# Patient Record
Sex: Female | Born: 1945 | Race: Black or African American | Hispanic: No | Smoking: Never smoker
Health system: Southern US, Community
[De-identification: ages and names within clinical notes are randomized; demographics above are authoritative.]

## PROBLEM LIST (undated history)

## (undated) DIAGNOSIS — M1990 Unspecified osteoarthritis, unspecified site: Secondary | ICD-10-CM

## (undated) DIAGNOSIS — I4891 Unspecified atrial fibrillation: Secondary | ICD-10-CM

## (undated) DIAGNOSIS — Z9289 Personal history of other medical treatment: Secondary | ICD-10-CM

## (undated) DIAGNOSIS — R079 Chest pain, unspecified: Secondary | ICD-10-CM

## (undated) DIAGNOSIS — I5189 Other ill-defined heart diseases: Secondary | ICD-10-CM

## (undated) DIAGNOSIS — M8580 Other specified disorders of bone density and structure, unspecified site: Secondary | ICD-10-CM

## (undated) DIAGNOSIS — R55 Syncope and collapse: Secondary | ICD-10-CM

## (undated) DIAGNOSIS — E785 Hyperlipidemia, unspecified: Secondary | ICD-10-CM

## (undated) DIAGNOSIS — I10 Essential (primary) hypertension: Secondary | ICD-10-CM

## (undated) DIAGNOSIS — N183 Chronic kidney disease, stage 3 unspecified: Secondary | ICD-10-CM

## (undated) HISTORY — DX: Other ill-defined heart diseases: I51.89

## (undated) HISTORY — PX: OTHER SURGICAL HISTORY: SHX169

## (undated) HISTORY — DX: Unspecified osteoarthritis, unspecified site: M19.90

## (undated) HISTORY — PX: HERNIA REPAIR: SHX51

## (undated) HISTORY — DX: Syncope and collapse: R55

## (undated) HISTORY — PX: ABDOMINAL HYSTERECTOMY: SHX81

## (undated) HISTORY — DX: Unspecified atrial fibrillation: I48.91

## (undated) HISTORY — DX: Hyperlipidemia, unspecified: E78.5

## (undated) HISTORY — DX: Chest pain, unspecified: R07.9

## (undated) HISTORY — DX: Chronic kidney disease, stage 3 unspecified: N18.30

## (undated) HISTORY — DX: Personal history of other medical treatment: Z92.89

## (undated) HISTORY — DX: Other specified disorders of bone density and structure, unspecified site: M85.80

---

## 2000-01-27 ENCOUNTER — Encounter: Admission: RE | Admit: 2000-01-27 | Discharge: 2000-04-26 | Payer: Self-pay | Admitting: Family Medicine

## 2003-09-22 ENCOUNTER — Encounter
Admission: RE | Admit: 2003-09-22 | Discharge: 2003-12-21 | Payer: Self-pay | Admitting: Physical Medicine & Rehabilitation

## 2005-01-17 ENCOUNTER — Encounter: Admission: RE | Admit: 2005-01-17 | Discharge: 2005-01-17 | Payer: Self-pay | Admitting: Family Medicine

## 2005-12-17 ENCOUNTER — Emergency Department (HOSPITAL_COMMUNITY): Admission: EM | Admit: 2005-12-17 | Discharge: 2005-12-17 | Payer: Self-pay | Admitting: Emergency Medicine

## 2006-06-14 ENCOUNTER — Ambulatory Visit (HOSPITAL_COMMUNITY): Admission: RE | Admit: 2006-06-14 | Discharge: 2006-06-14 | Payer: Self-pay | Admitting: Gastroenterology

## 2008-02-06 ENCOUNTER — Encounter: Admission: RE | Admit: 2008-02-06 | Discharge: 2008-02-06 | Payer: Self-pay | Admitting: Family Medicine

## 2010-06-20 ENCOUNTER — Encounter: Admission: RE | Admit: 2010-06-20 | Discharge: 2010-06-20 | Payer: Self-pay | Admitting: Family Medicine

## 2012-03-16 ENCOUNTER — Ambulatory Visit (INDEPENDENT_AMBULATORY_CARE_PROVIDER_SITE_OTHER): Payer: PRIVATE HEALTH INSURANCE | Admitting: Family Medicine

## 2012-03-16 VITALS — BP 162/76 | HR 86 | Temp 98.1°F | Resp 16 | Ht 65.25 in | Wt 166.0 lb

## 2012-03-16 DIAGNOSIS — L02419 Cutaneous abscess of limb, unspecified: Secondary | ICD-10-CM

## 2012-03-16 DIAGNOSIS — L03119 Cellulitis of unspecified part of limb: Secondary | ICD-10-CM

## 2012-03-16 LAB — POCT CBC
Granulocyte percent: 62 % (ref 37–80)
HCT, POC: 39.4 % (ref 37.7–47.9)
Hemoglobin: 11.9 g/dL — AB (ref 12.2–16.2)
Lymph, poc: 2.2 (ref 0.6–3.4)
MCH, POC: 26.6 pg — AB (ref 27–31.2)
MCHC: 30.2 g/dL — AB (ref 31.8–35.4)
MCV: 88.2 fL (ref 80–97)
MID (cbc): 0.6 (ref 0–0.9)
MPV: 8.4 fL (ref 0–99.8)
POC Granulocyte: 4.6 (ref 2–6.9)
POC LYMPH PERCENT: 29.7 % (ref 10–50)
POC MID %: 8.3 % (ref 0–12)
Platelet Count, POC: 234 10*3/uL (ref 142–424)
RBC: 4.47 M/uL (ref 4.04–5.48)
RDW, POC: 15 %
WBC: 7.5 10*3/uL (ref 4.6–10.2)

## 2012-03-16 MED ORDER — CEPHALEXIN 500 MG PO CAPS: 1000.0000 mg | ORAL_CAPSULE | Freq: Two times a day (BID) | ORAL | Status: AC

## 2012-03-16 NOTE — Progress Notes (Signed)
Patient Name: Christine Hogan Date of Birth: 1946-02-19 Medical Record Number: [id number] Gender: female Date of Encounter: 03/16/2012  History of Present Illness:  Christine Hogan is a 66 y.o. very pleasant female patient who presents with the following:  This morning she woke up with pain in her right shin.  It hurts to walk on the leg.  Never had this before.  No known injury.  Otherwise she feels ok. She did have a fever yesterday up to 102 and had to leave work early, however  this seems to have resolved today. She has not taken any anti-pyretics today  She does have a habit of picking her skin and has picked 2 places on her right calf recently.  She feels that this may be why she has her current infection.  Otherwise she is generally healthy   There is no problem list on file for this patient.  No past medical history on file. No past surgical history on file. History  Substance Use Topics  . Smoking status: Never Smoker   . Smokeless tobacco: Not on file  . Alcohol Use: Not on file   No family history on file. Allergies  Allergen Reactions  . Codeine Nausea And Vomiting    Medication list has been reviewed and updated.  Review of Systems: As per HPI- otherwise negative.  Physical Examination: Filed Vitals:   03/16/12 1537  BP: 162/76  Pulse: 86  Temp: 98.1 F (36.7 C)  Resp: 16  Height: 5' 5.25" (1.657 m)  Weight: 166 lb (75.297 kg)    Body mass index is 27.41 kg/(m^2).  GEN: WDWN, NAD, Non-toxic, A & O x 3 HEENT: Atraumatic, Normocephalic. Neck supple. No masses, No LAD. Ears and Nose: No external deformity. CV: RRR, No M/G/R. No JVD. No thrill. No extra heart sounds. PULM: CTA B, no wheezes, crackles, rhonchi. No retractions. No resp. distress. No accessory muscle use. EXTR: No c/c/e NEURO Normal gait.  PSYCH: Normally interactive. Conversant. Not depressed or anxious appearing.  Calm demeanor.  Right calf: on the medial distal calf there are two areas of  redness, induration and heat.  No fluctuance to suggest abscess.  No other rashes. However, her right calf has two "picked" areas where the skin has been picked away- one is fairly near the infected area.    Results for orders placed in visit on 03/16/12  POCT CBC      Component Value Range   WBC 7.5  4.6 - 10.2 (K/uL)   Lymph, poc 2.2  0.6 - 3.4    POC LYMPH PERCENT 29.7  10 - 50 (%L)   MID (cbc) 0.6  0 - 0.9    POC MID % 8.3  0 - 12 (%M)   POC Granulocyte 4.6  2 - 6.9    Granulocyte percent 62.0  37 - 80 (%G)   RBC 4.47  4.04 - 5.48 (M/uL)   Hemoglobin 11.9 (*) 12.2 - 16.2 (g/dL)   HCT, POC 40.9  81.1 - 47.9 (%)   MCV 88.2  80 - 97 (fL)   MCH, POC 26.6 (*) 27 - 31.2 (pg)   MCHC 30.2 (*) 31.8 - 35.4 (g/dL)   RDW, POC 91.4     Platelet Count, POC 234  142 - 424 (K/uL)   MPV 8.4  0 - 99.8 (fL)    Assessment and Plan: 1. Cellulitis, leg  cephALEXin (KEFLEX) 500 MG capsule, POCT CBC   Suspect that Lanore has developed cellulitis due  to picking at her skin.  She is aware now that she needs to stop doing this and feels motivated to do so.  Will use keflex as above, elevate the leg when able and use NSAIDs as needed.  Out of work tomorrow to elevate leg.  Patient (or parent if minor) instructed to return to clinic or call if not better in 2 day(s).  Sooner if worse.

## 2012-03-18 ENCOUNTER — Telehealth: Payer: Self-pay

## 2012-03-18 NOTE — Telephone Encounter (Signed)
Ok for note 

## 2012-03-18 NOTE — Telephone Encounter (Signed)
Pt states she was suppose to rtn to work today,but still in too much pain,needs oow note for today onl;y,will be going back 0514   Please call [phone]

## 2012-03-19 NOTE — Telephone Encounter (Signed)
LMOM THAT NOTE IS READY FOR PICKUP

## 2012-07-02 ENCOUNTER — Ambulatory Visit (INDEPENDENT_AMBULATORY_CARE_PROVIDER_SITE_OTHER): Payer: PRIVATE HEALTH INSURANCE | Admitting: Internal Medicine

## 2012-07-02 ENCOUNTER — Ambulatory Visit: Payer: PRIVATE HEALTH INSURANCE

## 2012-07-02 VITALS — BP 160/80 | HR 65 | Temp 97.8°F | Resp 16 | Ht 66.5 in | Wt 172.0 lb

## 2012-07-02 DIAGNOSIS — M549 Dorsalgia, unspecified: Secondary | ICD-10-CM

## 2012-07-02 DIAGNOSIS — M5380 Other specified dorsopathies, site unspecified: Secondary | ICD-10-CM

## 2012-07-02 DIAGNOSIS — M545 Low back pain, unspecified: Secondary | ICD-10-CM

## 2012-07-02 DIAGNOSIS — N23 Unspecified renal colic: Secondary | ICD-10-CM

## 2012-07-02 DIAGNOSIS — M62830 Muscle spasm of back: Secondary | ICD-10-CM

## 2012-07-02 DIAGNOSIS — R319 Hematuria, unspecified: Secondary | ICD-10-CM

## 2012-07-02 LAB — POCT URINALYSIS DIPSTICK
Bilirubin, UA: NEGATIVE
Glucose, UA: NEGATIVE
Ketones, UA: NEGATIVE
Leukocytes, UA: NEGATIVE
Nitrite, UA: NEGATIVE
Protein, UA: NEGATIVE
Spec Grav, UA: 1.02
Urobilinogen, UA: 0.2
pH, UA: 5

## 2012-07-02 LAB — POCT UA - MICROSCOPIC ONLY
Casts, Ur, LPF, POC: NEGATIVE
Crystals, Ur, HPF, POC: NEGATIVE
Yeast, UA: NEGATIVE

## 2012-07-02 MED ORDER — TRAMADOL HCL 50 MG PO TABS: 50.0000 mg | ORAL_TABLET | Freq: Three times a day (TID) | ORAL | Status: AC | PRN

## 2012-07-02 MED ORDER — CYCLOBENZAPRINE HCL 5 MG PO TABS: 5.0000 mg | ORAL_TABLET | Freq: Three times a day (TID) | ORAL | Status: AC | PRN

## 2012-07-02 NOTE — Patient Instructions (Signed)
Ultram and flexeril as directed. increase fluids. If your pain increases or if you develop vomiting or fever return to the office.

## 2012-07-02 NOTE — Progress Notes (Signed)
  Subjective:    Patient ID: Christine Hogan, female    DOB: 1945/12/08, 66 y.o.   MRN: [id number]  HPI  Back pain Moderate in severity 7/10 Increased with motion Onset yesterday after she got home from work Did lift and move a heavy patient at work yesterday and the friends home no numbness or tingling of extremities. No dysuria Review of Systems  Musculoskeletal: Positive for back pain.  All other systems reviewed and are negative.       Objective:   Physical Exam  Nursing note and vitals reviewed. Constitutional: She is oriented to person, place, and time. She appears well-developed and well-nourished.  HENT:  Head: Normocephalic and atraumatic.  Right Ear: External ear normal.  Left Ear: External ear normal.  Nose: Nose normal.  Mouth/Throat: Oropharynx is clear and moist.  Eyes: Conjunctivae and EOM are normal. Pupils are equal, round, and reactive to light.  Neck: Normal range of motion. Neck supple.  Cardiovascular: Normal rate, regular rhythm and normal heart sounds.   Pulmonary/Chest: Effort normal and breath sounds normal.  Abdominal: Soft. Bowel sounds are normal.  Musculoskeletal: She exhibits tenderness.       Spasm of the muscles on the left side  Neurological: She is alert and oriented to person, place, and time. She has normal reflexes.  Skin: Skin is warm and dry.  Psychiatric: She has a normal mood and affect. Her behavior is normal. Judgment and thought content normal.     [facility] reading (PRIMARY) by  Dr. Mindi Junker lumbar spine negitive Results for orders placed in visit on 07/02/12  POCT URINALYSIS DIPSTICK      Component Value Range   Color, UA yellow     Clarity, UA clear     Glucose, UA neg     Bilirubin, UA neg     Ketones, UA neg     Spec Grav, UA 1.020     Blood, UA mod     pH, UA 5.0     Protein, UA neg     Urobilinogen, UA 0.2     Nitrite, UA neg     Leukocytes, UA Negative    POCT UA - MICROSCOPIC ONLY      Component Value Range   WBC,  Ur, HPF, POC 0-1     RBC, urine, microscopic 0-6     Bacteria, U Microscopic trace     Mucus, UA trace     Epithelial cells, urine per micros 0-1     Crystals, Ur, HPF, POC neg     Casts, Ur, LPF, POC neg     Yeast, UA neg    .  Ua with moderate blood  0-6 rbc   Assessment & Plan:  Back pain left flank are Will access ua and xray s xrays noirmal ua with blood may reflect renal colic  Also has musculoskeletal pain.as reflected by hte spasm detected o n the exam

## 2012-07-27 ENCOUNTER — Encounter (HOSPITAL_COMMUNITY): Payer: Self-pay

## 2012-07-27 ENCOUNTER — Emergency Department (HOSPITAL_COMMUNITY)
Admission: EM | Admit: 2012-07-27 | Discharge: 2012-07-27 | Disposition: A | Discharge status: Home / Self Care | Payer: PRIVATE HEALTH INSURANCE | Attending: Emergency Medicine | Admitting: Emergency Medicine

## 2012-07-27 DIAGNOSIS — N289 Disorder of kidney and ureter, unspecified: Secondary | ICD-10-CM | POA: Insufficient documentation

## 2012-07-27 DIAGNOSIS — Z79899 Other long term (current) drug therapy: Secondary | ICD-10-CM | POA: Insufficient documentation

## 2012-07-27 DIAGNOSIS — I10 Essential (primary) hypertension: Secondary | ICD-10-CM | POA: Insufficient documentation

## 2012-07-27 DIAGNOSIS — R55 Syncope and collapse: Secondary | ICD-10-CM | POA: Insufficient documentation

## 2012-07-27 HISTORY — DX: Essential (primary) hypertension: I10

## 2012-07-27 LAB — URINALYSIS, ROUTINE W REFLEX MICROSCOPIC
Glucose, UA: NEGATIVE mg/dL
Ketones, ur: 15 mg/dL — AB
Leukocytes, UA: NEGATIVE
Nitrite: NEGATIVE
Protein, ur: 100 mg/dL — AB
Specific Gravity, Urine: 1.034 — ABNORMAL HIGH (ref 1.005–1.030)
Urobilinogen, UA: 1 mg/dL (ref 0.0–1.0)
pH: 5 (ref 5.0–8.0)

## 2012-07-27 LAB — CBC WITH DIFFERENTIAL/PLATELET
Basophils Absolute: 0 10*3/uL (ref 0.0–0.1)
Basophils Relative: 0 % (ref 0–1)
Eosinophils Absolute: 0 10*3/uL (ref 0.0–0.7)
Eosinophils Relative: 0 % (ref 0–5)
HCT: 39.8 % (ref 36.0–46.0)
Hemoglobin: 13 g/dL (ref 12.0–15.0)
Lymphocytes Relative: 15 % (ref 12–46)
Lymphs Abs: 1.2 10*3/uL (ref 0.7–4.0)
MCH: 28.1 pg (ref 26.0–34.0)
MCHC: 32.7 g/dL (ref 30.0–36.0)
MCV: 86.1 fL (ref 78.0–100.0)
Monocytes Absolute: 0.7 10*3/uL (ref 0.1–1.0)
Monocytes Relative: 9 % (ref 3–12)
Neutro Abs: 5.9 10*3/uL (ref 1.7–7.7)
Neutrophils Relative %: 75 % (ref 43–77)
Platelets: 209 10*3/uL (ref 150–400)
RBC: 4.62 MIL/uL (ref 3.87–5.11)
RDW: 13.9 % (ref 11.5–15.5)
WBC: 7.8 10*3/uL (ref 4.0–10.5)

## 2012-07-27 LAB — URINE MICROSCOPIC-ADD ON

## 2012-07-27 LAB — BASIC METABOLIC PANEL
BUN: 22 mg/dL (ref 6–23)
CO2: 27 mEq/L (ref 19–32)
Calcium: 10.9 mg/dL — ABNORMAL HIGH (ref 8.4–10.5)
Chloride: 101 mEq/L (ref 96–112)
Creatinine, Ser: 1.78 mg/dL — ABNORMAL HIGH (ref 0.50–1.10)
GFR calc Af Amer: 34 mL/min — ABNORMAL LOW (ref >90)
GFR calc non Af Amer: 29 mL/min — ABNORMAL LOW (ref >90)
Glucose, Bld: 117 mg/dL — ABNORMAL HIGH (ref 70–99)
Potassium: 3.8 mEq/L (ref 3.5–5.1)
Sodium: 140 mEq/L (ref 135–145)

## 2012-07-27 MED ORDER — ACETAMINOPHEN 500 MG PO TABS
1000.0000 mg | ORAL_TABLET | Freq: Once | ORAL | Status: AC
  Administered 2012-07-27: 1000 mg via ORAL
  Filled 2012-07-27: qty 2

## 2012-07-27 NOTE — ED Notes (Signed)
Per EMS pt at a church function sitting eating and became dizzy and sweaty, stood up and became syncopal, no fall, was assisted, out for 5 mins,

## 2012-07-27 NOTE — ED Notes (Signed)
Patient currently resting quietly in bed; no respiratory or acute distress noted.  Patient updated on plan of care; informed patient that we are currently waiting on EDP to talk about test results. Patient denies any needs at this time; will continue to monitor.

## 2012-07-27 NOTE — ED Notes (Signed)
AIDET performed. 

## 2012-07-27 NOTE — ED Notes (Signed)
Pt denies dizziness/lightheadedness

## 2012-07-27 NOTE — ED Provider Notes (Signed)
History     CSN: [id number]  Arrival date & time 07/27/12  1478   First MD Initiated Contact with Patient 07/27/12 1844      Chief Complaint  Patient presents with  . Loss of Consciousness    (Consider location/radiation/quality/duration/timing/severity/associated sxs/prior treatment) HPI... patient was sitting in church when she felt hot and sweaty. She sat up and passed out. No prodromal illnesses. No fevers, sweats, chills, stiff neck, neuro deficits, chest pain, shortness of breath, dysuria.  She is feeling better now. Nothing makes symptoms better or worse. Severity is mild to moderate.  Past Medical History  Diagnosis Date  . Hypertension     History reviewed. No pertinent past surgical history.  History reviewed. No pertinent family history.  History  Substance Use Topics  . Smoking status: Never Smoker   . Smokeless tobacco: Not on file  . Alcohol Use: Not on file    OB History    Grav Para Term Preterm Abortions TAB SAB Ect Mult Living                  Review of Systems  All other systems reviewed and are negative.    Allergies  Codeine  Home Medications   Current Outpatient Rx  Name Route Sig Dispense Refill  . ALISKIREN FUMARATE 300 MG PO TABS Oral Take 300 mg by mouth daily.    Marland Kitchen AMLODIPINE BESYLATE 10 MG PO TABS Oral Take 10 mg by mouth daily.    Marland Kitchen CLONIDINE HCL 0.1 MG PO TABS Oral Take 0.1 mg by mouth at bedtime.     . OMEGA-3 FATTY ACIDS 1000 MG PO CAPS Oral Take 1 g by mouth daily.     Marland Kitchen HYDROCHLOROTHIAZIDE 25 MG PO TABS Oral Take 25 mg by mouth daily.      BP 147/58  Pulse 70  Temp 98.2 F (36.8 C) (Oral)  Resp 25  SpO2 99%  Physical Exam  Nursing note and vitals reviewed. Constitutional: She is oriented to person, place, and time. She appears well-developed and well-nourished.  HENT:  Head: Normocephalic and atraumatic.  Eyes: Conjunctivae normal and EOM are normal. Pupils are equal, round, and reactive to light.  Neck: Normal  range of motion. Neck supple.  Cardiovascular: Normal rate, regular rhythm and normal heart sounds.   Pulmonary/Chest: Effort normal and breath sounds normal.  Abdominal: Soft. Bowel sounds are normal.  Musculoskeletal: Normal range of motion.  Neurological: She is alert and oriented to person, place, and time.  Skin: Skin is warm and dry.  Psychiatric: She has a normal mood and affect.    ED Course  Procedures (including critical care time)  Labs Reviewed  BASIC METABOLIC PANEL - Abnormal; Notable for the following:    Glucose, Bld 117 (*)     Creatinine, Ser 1.78 (*)     Calcium 10.9 (*)     GFR calc non Af Amer 29 (*)     GFR calc Af Amer 34 (*)     All other components within normal limits  URINALYSIS, ROUTINE W REFLEX MICROSCOPIC - Abnormal; Notable for the following:    Color, Urine AMBER (*)  BIOCHEMICALS MAY BE AFFECTED BY COLOR   APPearance CLOUDY (*)     Specific Gravity, Urine 1.034 (*)     Hgb urine dipstick MODERATE (*)     Bilirubin Urine SMALL (*)     Ketones, ur 15 (*)     Protein, ur 100 (*)     All  other components within normal limits  URINE MICROSCOPIC-ADD ON - Abnormal; Notable for the following:    Squamous Epithelial / LPF MANY (*)     Bacteria, UA FEW (*)     Casts GRANULAR CAST (*)  HYALINE CASTS   Crystals CA OXALATE CRYSTALS (*)     All other components within normal limits  CBC WITH DIFFERENTIAL   No results found.   No diagnosis found.   Date: 07/27/2012  Rate: 76  Rhythm: normal sinus rhythm  QRS Axis: normal  Intervals: normal  ST/T Wave abnormalities: normal  Conduction Disutrbances: none  Narrative Interpretation: unremarkable     MDM  Patient's normal physical exam. EKG normal. No anemia. Slightly elevated creatinine noted and discussed with patient. She was observed for several hours. Feeling better. Discharge home        Donnetta Hutching, MD 07/27/12 [phone]

## 2013-03-18 ENCOUNTER — Telehealth: Payer: Self-pay | Admitting: Family Medicine

## 2013-03-18 NOTE — Telephone Encounter (Signed)
Yes  Call out xanax 0.5 mg poq 8 hrs prn anxiety 20

## 2013-03-18 NOTE — Telephone Encounter (Signed)
Pt has death in family and wants to know if you can prescribe her something for her nerves to get her through.

## 2013-03-21 MED ORDER — ALPRAZOLAM 0.25 MG PO TABS: 0.5000 mg | ORAL_TABLET | Freq: Two times a day (BID) | ORAL | Status: DC | PRN

## 2013-03-21 NOTE — Telephone Encounter (Signed)
Pt called and says she is ready fro Rx called [facility]

## 2013-04-01 ENCOUNTER — Ambulatory Visit: Payer: PRIVATE HEALTH INSURANCE | Admitting: Family Medicine

## 2013-04-03 ENCOUNTER — Encounter: Payer: Self-pay | Admitting: Family Medicine

## 2013-04-03 ENCOUNTER — Ambulatory Visit (INDEPENDENT_AMBULATORY_CARE_PROVIDER_SITE_OTHER): Payer: PRIVATE HEALTH INSURANCE | Admitting: Family Medicine

## 2013-04-03 VITALS — BP 160/80 | HR 78 | Temp 98.2°F | Resp 16 | Wt 172.0 lb

## 2013-04-03 DIAGNOSIS — E782 Mixed hyperlipidemia: Secondary | ICD-10-CM | POA: Insufficient documentation

## 2013-04-03 DIAGNOSIS — I10 Essential (primary) hypertension: Secondary | ICD-10-CM

## 2013-04-03 DIAGNOSIS — E785 Hyperlipidemia, unspecified: Secondary | ICD-10-CM

## 2013-04-03 NOTE — Progress Notes (Signed)
  Subjective:    Patient ID: Christine Hogan, female    DOB: 06/18/1946, 67 y.o.   MRN: [id number]  HPI Patient is here today for followup of her hypertension. She is probably taking Norvasc 10 mg by mouth daily, clonidine 0.1 mg by mouth twice a day, and hydrochlorothiazide 25 mg by mouth daily. Her blood pressures at home ranging 160-180/80-90. She denies any chest pain shortness of breath or dyspnea on exertion.  Chest has hyperlipidemia patient currently on Lipitor 40 mg by mouth daily and Zetia 10 mg by mouth daily. She denies any myalgias right upper quadrant pain. She is due to check her fasting lipid panel. Past Medical History  Diagnosis Date  . Hypertension   . Hyperlipidemia    Current Outpatient Prescriptions on File Prior to Visit  Medication Sig Dispense Refill  . ALPRAZolam (XANAX) 0.25 MG tablet Take 2 tablets (0.5 mg total) by mouth 2 (two) times daily as needed for sleep or anxiety.  20 tablet  0  . amLODipine (NORVASC) 10 MG tablet Take 10 mg by mouth daily.      . cloNIDine (CATAPRES) 0.1 MG tablet Take 0.1 mg by mouth 2 (two) times daily.       . fish oil-omega-3 fatty acids 1000 MG capsule Take 1 g by mouth daily.       . hydrochlorothiazide (HYDRODIURIL) 25 MG tablet Take 25 mg by mouth daily.       No current facility-administered medications on file prior to visit.   Allergies  Allergen Reactions  . Codeine Nausea And Vomiting    dizziness   History   Social History  . Marital Status: Divorced    Spouse Name: N/A    Number of Children: N/A  . Years of Education: N/A   Occupational History  . Not on file.   Social History Main Topics  . Smoking status: Never Smoker   . Smokeless tobacco: Not on file  . Alcohol Use: Not on file  . Drug Use: Not on file  . Sexually Active: Not on file   Other Topics Concern  . Not on file   Social History Narrative  . No narrative on file      Review of Systems  All other systems reviewed and are  negative.       Objective:   Physical Exam  Vitals reviewed. Constitutional: She appears well-developed and well-nourished.  Cardiovascular: Normal rate and regular rhythm.   Murmur heard. Pulmonary/Chest: Effort normal and breath sounds normal. No respiratory distress. She has no wheezes. She has no rales.  Abdominal: Soft. Bowel sounds are normal. She exhibits no distension. There is no tenderness. There is no rebound.  No peripheral edema        Assessment & Plan:  1. HTN (hypertension) Increase clonidine to 0.2 mg by mouth twice a day.  Recheck blood pressure in 2 weeks.  2. HLD (hyperlipidemia) Return fasting for fasting lipid panel. LDL is less than 130. - COMPLETE METABOLIC PANEL WITH GFR; Future - Lipid panel; Future

## 2013-04-10 ENCOUNTER — Other Ambulatory Visit (INDEPENDENT_AMBULATORY_CARE_PROVIDER_SITE_OTHER): Payer: PRIVATE HEALTH INSURANCE

## 2013-04-10 DIAGNOSIS — E785 Hyperlipidemia, unspecified: Secondary | ICD-10-CM

## 2013-04-10 LAB — COMPLETE METABOLIC PANEL WITH GFR
ALT: 17 U/L (ref 0–35)
AST: 18 U/L (ref 0–37)
Albumin: 4 g/dL (ref 3.5–5.2)
Alkaline Phosphatase: 69 U/L (ref 39–117)
BUN: 13 mg/dL (ref 6–23)
CO2: 28 mEq/L (ref 19–32)
Calcium: 10.3 mg/dL (ref 8.4–10.5)
Chloride: 104 mEq/L (ref 96–112)
Creat: 1.19 mg/dL — ABNORMAL HIGH (ref 0.50–1.10)
GFR, Est African American: 55 mL/min — ABNORMAL LOW
GFR, Est Non African American: 48 mL/min — ABNORMAL LOW
Glucose, Bld: 99 mg/dL (ref 70–99)
Potassium: 4.7 mEq/L (ref 3.5–5.3)
Sodium: 142 mEq/L (ref 135–145)
Total Bilirubin: 0.8 mg/dL (ref 0.3–1.2)
Total Protein: 6.7 g/dL (ref 6.0–8.3)

## 2013-04-10 LAB — LIPID PANEL
Cholesterol: 140 mg/dL (ref 0–200)
HDL: 43 mg/dL (ref >39)
LDL Cholesterol: 74 mg/dL (ref 0–99)
Total CHOL/HDL Ratio: 3.3 Ratio
Triglycerides: 117 mg/dL (ref <150)
VLDL: 23 mg/dL (ref 0–40)

## 2013-05-29 ENCOUNTER — Ambulatory Visit (INDEPENDENT_AMBULATORY_CARE_PROVIDER_SITE_OTHER): Payer: PRIVATE HEALTH INSURANCE | Admitting: Family Medicine

## 2013-05-29 ENCOUNTER — Ambulatory Visit: Payer: PRIVATE HEALTH INSURANCE

## 2013-05-29 VITALS — BP 184/75 | HR 67 | Temp 98.1°F | Resp 17 | Wt 168.0 lb

## 2013-05-29 DIAGNOSIS — M79609 Pain in unspecified limb: Secondary | ICD-10-CM

## 2013-05-29 DIAGNOSIS — M7989 Other specified soft tissue disorders: Secondary | ICD-10-CM

## 2013-05-29 DIAGNOSIS — M79672 Pain in left foot: Secondary | ICD-10-CM

## 2013-05-29 MED ORDER — DOXYCYCLINE HYCLATE 100 MG PO TABS: 100.0000 mg | ORAL_TABLET | Freq: Two times a day (BID) | ORAL | Status: DC

## 2013-05-29 NOTE — Patient Instructions (Signed)
Ice on and off for 15-20 minutes tonight, elevate foot tonight as discussed. If redness and swelling not improved tomorrow, can start antibiotic as discussed, but if you do start the antibiotic- follow up with Dr. Katrinka Blazing on Saturday morning after 10am. Return to the clinic or go to the nearest emergency room if any of your symptoms worsen or new symptoms occur. Ok to take tylenol for pain, and 1 advil only if blood pressure is back to normal range.

## 2013-05-29 NOTE — Progress Notes (Signed)
Subjective:    Patient ID: LINSI HUMANN, female    DOB: 03/17/1946, 67 y.o.   MRN: [id number]  HPI YVANNA VIDAS is a 67 y.o. female L foot pain, swelling - with slight numbness in toes yesterday. Wearing tennis shoes yesterday, was walking outside - does not remember injury or getting stung. CNA at [employer]. Noticed soreness when took shoes off when she got home last night. Limping on sore area today. No wound, no hx of gout.   Hx. of HTN - PCP: Dr. Tanya Nones.  No missed doses.  Elevated in past when in pain. Usually in 130-140/80 range. No chest pain/dizziness/vision changes. Only slight headache - similar to past when pressure up.    Review of Systems  Constitutional: Negative for fever and chills.  Respiratory: Negative for shortness of breath.   Cardiovascular: Negative for chest pain.  Musculoskeletal: Positive for joint swelling and arthralgias.  Skin: Negative for rash and wound.  Neurological: Positive for headaches (minimal. ). Negative for dizziness, facial asymmetry and weakness.       Objective:   Physical Exam  Vitals reviewed. Constitutional: She is oriented to person, place, and time. She appears well-developed and well-nourished. No distress.  Cardiovascular: Normal rate, regular rhythm, normal heart sounds and intact distal pulses.   Pulmonary/Chest: Effort normal and breath sounds normal.  Musculoskeletal:       Left ankle: She exhibits normal range of motion. Tenderness. AITFL (min. ) tenderness found. No lateral malleolus, no medial malleolus and no CF ligament tenderness found.       Left foot: She exhibits tenderness, bony tenderness (lateral to mid 5th mt. ) and swelling. She exhibits normal range of motion and no laceration.       Feet:  Neurological: She is alert and oriented to person, place, and time. She has normal strength. No sensory deficit.  nonfocal.  Skin: Skin is warm, dry and intact.  Psychiatric: She has a normal mood and affect. Her behavior  is normal.    [facility] reading (PRIMARY) by  Dr. Neva Seat: L foot: prior pin/hardware intact at 1st mt. Degenerative changes with osteophyte at lateral tarsals and tarsometatarsal joint, no acute findings/fracture.       Assessment & Plan:  NOGA FOGG is a 67 y.o. female Left foot pain - Plan: DG Foot Complete Left with acute swelling, slight warmth/erythema since yesterday. Underlying degenerative changes, but no known hx of gout. No wound or apparent source of infection. Can start with tylenol otc, ice,elevation, advil only if blood pressure returns to normal range - short course/low dose due to htn. If redness not improving in next day, can fill doxycycline for possible cellulitis, but plan to recheck with Dr. Katrinka Blazing in 48 hours if does need to start antibiotic. rtc precautions discussed.  Elevated BP - pain and white coat component. Better home numbers. Rtc/er precautions.   Meds ordered this encounter  Medications  . doxycycline (VIBRA-TABS) 100 MG tablet    Sig: Take 1 tablet (100 mg total) by mouth 2 (two) times daily.    Dispense:  20 tablet    Refill:  0    Patient Instructions  Ice on and off for 15-20 minutes tonight, elevate foot tonight as discussed. If redness and swelling not improved tomorrow, can start antibiotic as discussed, but if you do start the antibiotic- follow up with Dr. Katrinka Blazing on Saturday morning after 10am. Return to the clinic or go to the nearest emergency room if any of your  symptoms worsen or new symptoms occur. Ok to take tylenol for pain, and 1 advil only if blood pressure is back to normal range.

## 2013-05-31 ENCOUNTER — Ambulatory Visit (INDEPENDENT_AMBULATORY_CARE_PROVIDER_SITE_OTHER): Payer: PRIVATE HEALTH INSURANCE | Admitting: Family Medicine

## 2013-05-31 VITALS — BP 128/84 | HR 65 | Temp 98.0°F | Resp 18 | Ht 66.5 in | Wt 168.0 lb

## 2013-05-31 DIAGNOSIS — L03116 Cellulitis of left lower limb: Secondary | ICD-10-CM

## 2013-05-31 DIAGNOSIS — L02619 Cutaneous abscess of unspecified foot: Secondary | ICD-10-CM

## 2013-05-31 DIAGNOSIS — L03119 Cellulitis of unspecified part of limb: Secondary | ICD-10-CM

## 2013-05-31 DIAGNOSIS — M25473 Effusion, unspecified ankle: Secondary | ICD-10-CM

## 2013-05-31 DIAGNOSIS — M25475 Effusion, left foot: Secondary | ICD-10-CM

## 2013-05-31 MED ORDER — NAPROXEN 500 MG PO TABS: 500.0000 mg | ORAL_TABLET | Freq: Two times a day (BID) | ORAL | Status: DC

## 2013-05-31 NOTE — Progress Notes (Signed)
   [address]   [place], [place]  [address]   [phone]  Subjective:    Patient ID: Christine Hogan, female    DOB: 1946-07-17, 67 y.o.   MRN: [id number]  HPI This 67 y.o. female presents for evaluation of foot pain, swelling, redness.  Evaluated two days ago by Dr. Neva Seat.  Prescribed Doxycycline; started Doxycycline yesterday due to persistent swelling and redness.  Still painful and now more painful to the bone.  Redness improved today.  No fever/chills/sweats.  No skin break down.  Review of Systems  Constitutional: Negative for fever, chills, diaphoresis and fatigue.  Musculoskeletal: Positive for joint swelling, arthralgias and gait problem.  Skin: Positive for color change. Negative for pallor, rash and wound.  Neurological: Negative for weakness and numbness.   Past Medical History  Diagnosis Date  . Hypertension   . Hyperlipidemia   . Arthritis    Current Outpatient Prescriptions on File Prior to Visit  Medication Sig Dispense Refill  . ALPRAZolam (XANAX) 0.25 MG tablet Take 2 tablets (0.5 mg total) by mouth 2 (two) times daily as needed for sleep or anxiety.  20 tablet  0  . amLODipine (NORVASC) 10 MG tablet Take 10 mg by mouth daily.      Marland Kitchen aspirin 81 MG tablet Take 81 mg by mouth daily.      Marland Kitchen atorvastatin (LIPITOR) 40 MG tablet Take 40 mg by mouth daily.      . cloNIDine (CATAPRES) 0.1 MG tablet Take 0.1 mg by mouth 2 (two) times daily.       Marland Kitchen doxycycline (VIBRA-TABS) 100 MG tablet Take 1 tablet (100 mg total) by mouth 2 (two) times daily.  20 tablet  0  . ezetimibe (ZETIA) 10 MG tablet Take 10 mg by mouth daily.      . fish oil-omega-3 fatty acids 1000 MG capsule Take 1 g by mouth daily.       . hydrochlorothiazide (HYDRODIURIL) 25 MG tablet Take 25 mg by mouth daily.       No current facility-administered medications on file prior to visit.       Objective:   Physical Exam  Nursing note and vitals reviewed. Constitutional: She is oriented to person, place, and  time. She appears well-developed and well-nourished. No distress.  Cardiovascular: Normal rate and regular rhythm.   Pulmonary/Chest: Effort normal and breath sounds normal.  Musculoskeletal:       Left ankle: Normal.       Left foot: She exhibits tenderness and swelling. She exhibits normal range of motion, no deformity and no laceration.       Feet:  L lateral foot with area of swelling 4 cm diameter with TTP; resolution of erythema; no TTP with metatarsal squeeze.  Full ROM L ankle.  Neurological: She is alert and oriented to person, place, and time.  Skin: Skin is warm and dry. No rash noted. She is not diaphoretic. No erythema. No pallor.  Psychiatric: She has a normal mood and affect. Her behavior is normal.       Assessment & Plan:  Cellulitis of foot, left  Swelling of foot joint, left   1. Cellulitis L foot: improved; advised to complete Doxycycline therapy.  RTC for fever, recurrent redness. 2. Swelling of foot L: persistent; redness has resolved with Doxycycline therapy.  Recommend supportive care with elevation, icing of foot, supportive tennis shoe; avoid prolonged standing or ambulation for the next week.  RTC for worsening.

## 2013-05-31 NOTE — Patient Instructions (Addendum)
1.  Elevate foot and ice it twice daily. 2. Start Naproxen one tablet twice daily. 3. Complete Doxycycline. 4.  Return for recurrent redness or worsening swelling.

## 2013-06-27 ENCOUNTER — Ambulatory Visit (INDEPENDENT_AMBULATORY_CARE_PROVIDER_SITE_OTHER): Payer: PRIVATE HEALTH INSURANCE | Admitting: Family Medicine

## 2013-06-27 ENCOUNTER — Encounter: Payer: Self-pay | Admitting: Family Medicine

## 2013-06-27 VITALS — BP 160/84 | HR 72 | Temp 97.4°F | Resp 15 | Ht 64.5 in | Wt 168.0 lb

## 2013-06-27 DIAGNOSIS — N189 Chronic kidney disease, unspecified: Secondary | ICD-10-CM

## 2013-06-27 DIAGNOSIS — I10 Essential (primary) hypertension: Secondary | ICD-10-CM

## 2013-06-27 DIAGNOSIS — R079 Chest pain, unspecified: Secondary | ICD-10-CM

## 2013-06-27 LAB — CBC WITH DIFFERENTIAL/PLATELET
Basophils Absolute: 0 10*3/uL (ref 0.0–0.1)
Basophils Relative: 0 % (ref 0–1)
Eosinophils Absolute: 0.1 10*3/uL (ref 0.0–0.7)
Eosinophils Relative: 1 % (ref 0–5)
HCT: 36.1 % (ref 36.0–46.0)
Hemoglobin: 12.3 g/dL (ref 12.0–15.0)
Lymphocytes Relative: 36 % (ref 12–46)
Lymphs Abs: 1.9 10*3/uL (ref 0.7–4.0)
MCH: 28.3 pg (ref 26.0–34.0)
MCHC: 34.1 g/dL (ref 30.0–36.0)
MCV: 83 fL (ref 78.0–100.0)
Monocytes Absolute: 0.4 10*3/uL (ref 0.1–1.0)
Monocytes Relative: 8 % (ref 3–12)
Neutro Abs: 2.9 10*3/uL (ref 1.7–7.7)
Neutrophils Relative %: 55 % (ref 43–77)
Platelets: 223 10*3/uL (ref 150–400)
RBC: 4.35 MIL/uL (ref 3.87–5.11)
RDW: 14.1 % (ref 11.5–15.5)
WBC: 5.3 10*3/uL (ref 4.0–10.5)

## 2013-06-27 LAB — BASIC METABOLIC PANEL
BUN: 15 mg/dL (ref 6–23)
CO2: 27 mEq/L (ref 19–32)
Calcium: 10 mg/dL (ref 8.4–10.5)
Chloride: 107 mEq/L (ref 96–112)
Creat: 1.45 mg/dL — ABNORMAL HIGH (ref 0.50–1.10)
Glucose, Bld: 127 mg/dL — ABNORMAL HIGH (ref 70–99)
Potassium: 3.8 mEq/L (ref 3.5–5.3)
Sodium: 142 mEq/L (ref 135–145)

## 2013-06-27 LAB — CK TOTAL AND CKMB (NOT AT ARMC)
CK, MB: 2.3 ng/mL (ref 0.3–4.0)
Relative Index: 1.7 (ref 0.0–2.5)
Total CK: 133 U/L (ref 7–177)

## 2013-06-27 LAB — TROPONIN I: Troponin I: 0.01 ng/mL (ref <0.06)

## 2013-06-27 LAB — TSH: TSH: 1.759 u[IU]/mL (ref 0.350–4.500)

## 2013-06-27 MED ORDER — NITROGLYCERIN 0.4 MG SL SUBL: 0.4000 mg | SUBLINGUAL_TABLET | SUBLINGUAL | Status: DC | PRN

## 2013-06-27 MED ORDER — ALPRAZOLAM 0.25 MG PO TABS: 0.5000 mg | ORAL_TABLET | Freq: Two times a day (BID) | ORAL | Status: DC | PRN

## 2013-06-27 NOTE — Progress Notes (Signed)
  Subjective:    Patient ID: Christine Hogan, female    DOB: 06/15/1946, 67 y.o.   MRN: [id number]  HPI  Pt here with chest pain on and off for the past week. Pain described as sharp pain located on both left and right side of chest, occasionally radiates down right arm. Denies SOB, Nausea/vomiting, diaphoresis associated. Has been under some stress recently with the anniversary of her grandchild's death and his birthday, as well as caring for her father who is 61. She also notes she has a constant achiness on both side of her chest which is different from the sharp pains.  She has uncontrolled HTN, noted throughout her chart, has been on multiple meds, including tekturna, bystolic,cardura, benzapril and current meds. Last visit in May advised to increase clonidine to 0.2mg  BID but stated BP dropped too low and she  had dizziness and weakness and was unable to tolerate this.   Renal arteries evaluated in 2007 normal  Review of Systems   GEN- denies fatigue, fever, weight loss,weakness, recent illness HEENT- denies eye drainage, change in vision, nasal discharge, CVS- + chest pain,denies palpitations RESP- denies SOB, cough, wheeze ABD- denies N/V, change in stools, abd pain MSK- denies joint pain, muscle aches, injury Neuro- denies headache, dizziness, syncope, seizure activity      Objective:   Physical Exam  GEN- NAD, alert and oriented x3, repeat BP 160/80, well appearing HEENT- PERRL, EOMI, non injected sclera, pink conjunctiva, MMM, oropharynx clear Neck- Supple,  CVS- RRR, no murmur Chest wall- TTP both sides of chest, skin intact, no rash RESP-CTAB ABD-NABS,soft,NT,ND EXT- No edema Pulses- Radial, DP- 2+  EKG-  NSR, flat t waves anterolateral leads Compared to 2013     Assessment & Plan:

## 2013-06-27 NOTE — Patient Instructions (Signed)
Use the xanax as needed Use the nitroglyercin We will call with labs  F/U 4 weeks for blood pressure

## 2013-06-28 DIAGNOSIS — N189 Chronic kidney disease, unspecified: Secondary | ICD-10-CM | POA: Insufficient documentation

## 2013-06-28 NOTE — Assessment & Plan Note (Signed)
Noted on labs.  

## 2013-06-28 NOTE — Assessment & Plan Note (Signed)
EKG reassuring, STAT labs including Troponin normal with exception of CRI WIll set up with cardiology, needs better BP control and possible stress testing  Other differentials include anxiety and stress with recent [place] Ouch of grandson and caring for her 65 year old father Advised to use xanax for stress as needed, given NTG as well with instructions

## 2013-06-28 NOTE — Assessment & Plan Note (Signed)
Uncontrolled BP for many years, has been on multiple agents Refer to cardiology for recommendations

## 2013-07-02 NOTE — Addendum Note (Signed)
Addended by: Milinda Antis F on: 07/02/2013 02:01 PM   Modules accepted: Orders

## 2013-07-02 NOTE — Addendum Note (Signed)
Addended by: Elvina Mattes T on: 07/02/2013 12:47 PM   Modules accepted: Orders

## 2013-07-03 ENCOUNTER — Encounter: Payer: Self-pay | Admitting: Cardiovascular Disease

## 2013-07-03 ENCOUNTER — Ambulatory Visit (INDEPENDENT_AMBULATORY_CARE_PROVIDER_SITE_OTHER): Payer: PRIVATE HEALTH INSURANCE | Admitting: Cardiovascular Disease

## 2013-07-03 VITALS — BP 132/80 | Ht 65.5 in | Wt 168.6 lb

## 2013-07-03 DIAGNOSIS — R9431 Abnormal electrocardiogram [ECG] [EKG]: Secondary | ICD-10-CM

## 2013-07-03 DIAGNOSIS — E785 Hyperlipidemia, unspecified: Secondary | ICD-10-CM

## 2013-07-03 DIAGNOSIS — Z79899 Other long term (current) drug therapy: Secondary | ICD-10-CM

## 2013-07-03 DIAGNOSIS — R079 Chest pain, unspecified: Secondary | ICD-10-CM

## 2013-07-03 DIAGNOSIS — I10 Essential (primary) hypertension: Secondary | ICD-10-CM

## 2013-07-03 NOTE — Patient Instructions (Addendum)
Your physician has requested that you have an echocardiogram. Echocardiography is a painless test that uses sound waves to create images of your heart. It provides your doctor with information about the size and shape of your heart and how well your heart's chambers and valves are working. This procedure takes approximately one hour. There are no restrictions for this procedure.  Your physician has requested that you have a lexiscan myoview. For further information please visit [email or web address] Please follow instruction sheet, as given.  Your physician recommends that you return for lab work in a few days to 1 week. You will need to be fasting for this lab work.  CMET, NMR with lipid  Dr. Tresa Endo recommends that you schedule a follow-up appointment in: 4 weeks, after your tests.   Try OTC Aleve for your chest discomfort.

## 2013-07-10 LAB — COMPREHENSIVE METABOLIC PANEL
ALT: 23 U/L (ref 0–35)
AST: 24 U/L (ref 0–37)
Albumin: 4.5 g/dL (ref 3.5–5.2)
Alkaline Phosphatase: 70 U/L (ref 39–117)
BUN: 22 mg/dL (ref 6–23)
CO2: 26 mEq/L (ref 19–32)
Calcium: 9.8 mg/dL (ref 8.4–10.5)
Chloride: 103 mEq/L (ref 96–112)
Creat: 1.27 mg/dL — ABNORMAL HIGH (ref 0.50–1.10)
Glucose, Bld: 104 mg/dL — ABNORMAL HIGH (ref 70–99)
Potassium: 4.1 mEq/L (ref 3.5–5.3)
Sodium: 138 mEq/L (ref 135–145)
Total Bilirubin: 1 mg/dL (ref 0.3–1.2)
Total Protein: 7.2 g/dL (ref 6.0–8.3)

## 2013-07-11 LAB — NMR LIPOPROFILE WITH LIPIDS
Cholesterol, Total: 191 mg/dL (ref <200)
HDL Particle Number: 45.3 umol/L (ref >=30.5)
HDL Size: 9.3 nm (ref >=9.2)
HDL-C: 52 mg/dL (ref >=40)
LDL (calc): 97 mg/dL (ref <100)
LDL Particle Number: 1580 nmol/L — ABNORMAL HIGH (ref <1000)
LDL Size: 19.7 nm — ABNORMAL LOW (ref >20.5)
LP-IR Score: 59 — ABNORMAL HIGH (ref <=45)
Large HDL-P: 5.7 umol/L (ref >=4.8)
Large VLDL-P: 7.9 nmol/L — ABNORMAL HIGH (ref <=2.7)
Small LDL Particle Number: 1286 nmol/L — ABNORMAL HIGH (ref <=527)
Triglycerides: 210 mg/dL — ABNORMAL HIGH (ref <150)
VLDL Size: 50.9 nm — ABNORMAL HIGH (ref <=46.6)

## 2013-07-16 ENCOUNTER — Ambulatory Visit (HOSPITAL_BASED_OUTPATIENT_CLINIC_OR_DEPARTMENT_OTHER)
Admission: RE | Admit: 2013-07-16 | Discharge: 2013-07-16 | Disposition: A | Discharge status: Home / Self Care | Payer: PRIVATE HEALTH INSURANCE | Source: Ambulatory Visit | Attending: Cardiovascular Disease | Admitting: Cardiovascular Disease

## 2013-07-16 ENCOUNTER — Ambulatory Visit (HOSPITAL_COMMUNITY)
Admission: RE | Admit: 2013-07-16 | Discharge: 2013-07-16 | Disposition: A | Discharge status: Home / Self Care | Payer: PRIVATE HEALTH INSURANCE | Source: Ambulatory Visit | Attending: Cardiovascular Disease | Admitting: Cardiovascular Disease

## 2013-07-16 DIAGNOSIS — R0609 Other forms of dyspnea: Secondary | ICD-10-CM | POA: Insufficient documentation

## 2013-07-16 DIAGNOSIS — R55 Syncope and collapse: Secondary | ICD-10-CM | POA: Insufficient documentation

## 2013-07-16 DIAGNOSIS — R079 Chest pain, unspecified: Secondary | ICD-10-CM

## 2013-07-16 DIAGNOSIS — I10 Essential (primary) hypertension: Secondary | ICD-10-CM | POA: Insufficient documentation

## 2013-07-16 DIAGNOSIS — R0989 Other specified symptoms and signs involving the circulatory and respiratory systems: Secondary | ICD-10-CM | POA: Insufficient documentation

## 2013-07-16 DIAGNOSIS — E663 Overweight: Secondary | ICD-10-CM | POA: Insufficient documentation

## 2013-07-16 DIAGNOSIS — R9431 Abnormal electrocardiogram [ECG] [EKG]: Secondary | ICD-10-CM

## 2013-07-16 MED ORDER — REGADENOSON 0.4 MG/5ML IV SOLN
0.4000 mg | Freq: Once | INTRAVENOUS | Status: AC
  Administered 2013-07-16: 0.4 mg via INTRAVENOUS

## 2013-07-16 MED ORDER — TECHNETIUM TC 99M SESTAMIBI GENERIC - CARDIOLITE
30.0000 | Freq: Once | INTRAVENOUS | Status: AC | PRN
  Administered 2013-07-16: 30 via INTRAVENOUS

## 2013-07-16 MED ORDER — TECHNETIUM TC 99M SESTAMIBI GENERIC - CARDIOLITE
10.0000 | Freq: Once | INTRAVENOUS | Status: AC | PRN
  Administered 2013-07-16: 10 via INTRAVENOUS

## 2013-07-16 NOTE — Procedures (Addendum)
[facility] [address] [place] [address] [phone]  Cardiology Nuclear Med Study  Christine Hogan is a 67 y.o. female     MRN : [id number]     DOB: [birth year]  Procedure Date: 07/16/2013  Nuclear Med Background Indication for Stress Test:  Abnormal EKG History:  PT DENIES PRIOR HISTORY. Cardiac Risk Factors: Family History - CAD, Hypertension, Lipids and Overweight  Symptoms:  Chest Pain, Dizziness and DOE   Nuclear Pre-Procedure Caffeine/Decaff Intake:  1:00am NPO After: 11AM   IV Site: R Forearm  IV 0.9% NS with Angio Cath:  22g  Chest Size (in):  N/A IV Started by: Emmit Pomfret, RN  Height: 5\' 4"  (1.626 m)  Cup Size: D  BMI:  Body mass index is 28.82 kg/(m^2). Weight:  168 lb (76.204 kg)   Tech Comments:  N/A    Nuclear Med Study 1 or 2 day study: 1 day  Stress Test Type:  Lexiscan  Order Authorizing Provider:  Nicki Guadalajara, MD   Resting Radionuclide: Technetium 41m Sestamibi  Resting Radionuclide Dose: 9.9 mCi   Stress Radionuclide:  Technetium 71m Sestamibi  Stress Radionuclide Dose: 30.5 mCi           Stress Protocol Rest HR: 63 Stress HR: 104  Rest BP: 173/90 Stress BP:173/90  Exercise Time (min): n/a METS: n/a          Dose of Adenosine (mg):  n/a Dose of Lexiscan: 0.4 mg  Dose of Atropine (mg): n/a Dose of Dobutamine: n/a mcg/kg/min (at max HR)  Stress Test Technologist: Ernestene Mention, CCT Nuclear Technologist: Koren Shiver, CNMT   Rest Procedure:  Myocardial perfusion imaging was performed at rest 45 minutes following the intravenous administration of Technetium 62m Sestamibi. Stress Procedure:  The patient received IV Lexiscan 0.4 mg over 15-seconds.  Technetium 21m Sestamibi injected at 30-seconds.  There were no significant changes with Lexiscan.  Quantitative spect images were obtained after a 45 minute delay.  Transient Ischemic Dilatation (Normal <1.22):  1.05 Lung/Heart Ratio (Normal  <0.45):  0.34 QGS EDV:  64 ml QGS ESV:  13 ml LV Ejection Fraction: 79%  Rest ECG: NSR with non-specific ST-T wave changes  Stress ECG: No significant change from baseline ECG  QPS Raw Data Images:  Significant liver and bowel signal noted. Stress Images:  Normal homogeneous uptake in all areas of the myocardium. Rest Images:  Normal homogeneous uptake in all areas of the myocardium. Subtraction (SDS):  No evidence of ischemia.  Impression Exercise Capacity:  Lexiscan with no exercise. BP Response:  Normal blood pressure response. Clinical Symptoms:  There is dyspnea. ECG Impression:  No significant ECG changes with Lexiscan. Comparison with Prior Nuclear Study: No previous nuclear study performed  Overall Impression:  Normal stress nuclear study.  LV Wall Motion:  NL LV Function; NL Wall Motion; EF 79%  Chrystie Nose, MD, [facility] Board Certified in Nuclear Cardiology Attending Cardiologist [facility]  Chrystie Nose, MD  07/16/2013 5:27 PM

## 2013-07-16 NOTE — Progress Notes (Signed)
2D Echo Performed 07/16/2013    Rodolphe Edmonston, RCS  

## 2013-07-25 ENCOUNTER — Ambulatory Visit: Payer: PRIVATE HEALTH INSURANCE | Admitting: Family Medicine

## 2013-07-26 ENCOUNTER — Encounter: Payer: Self-pay | Admitting: Cardiovascular Disease

## 2013-07-26 NOTE — Progress Notes (Signed)
Patient ID: Christine Hogan, female   DOB: [birth year], 67 y.o.   MRN: [id number]     PATIENT PROFILE:  Christine Hogan is an 67 year old AA female is referred for the courtesy of Dr. Jeanice Lim for evaluation of chest pain.   HPI: Christine Hogan admits to at least a 10 year history of hypertension. She also is a history of hyperlipidemia as well as arthritis of. She is followed at the [facility]. She does have issues with arthritis and has recently experienced some right knee swelling and some difficulty with her left foot. Recently, she also started to notice some aching right-sided chest discomfort. This does not appear to be definitively exertional. She describes it as being somewhat sharp. He usually is self-limiting. She was recently seen by Dr. Jeanice Lim who at [facility]. Laboratory was done which showed a hemoglobin of 12.3 hematocrit 36.1 fasting glucose was elevated at 127. She does have mild renal insufficiency with a BUN of 15 creatinine 1.45. Thyroid function studies were normal. CPK was negative. She is now referred for cardiology evaluation.  Past Medical History  Diagnosis Date  . Hypertension   . Hyperlipidemia   . Arthritis   . Chest pain     Past Surgical History  Procedure Laterality Date  . Hernia repair    . Bunion repair      Allergies  Allergen Reactions  . Codeine Nausea And Vomiting    dizziness    Current Outpatient Prescriptions  Medication Sig Dispense Refill  . amLODipine (NORVASC) 10 MG tablet Take 10 mg by mouth daily.      Marland Kitchen aspirin 81 MG tablet Take 81 mg by mouth daily.      Marland Kitchen atorvastatin (LIPITOR) 40 MG tablet Take 40 mg by mouth daily.      . cloNIDine (CATAPRES) 0.1 MG tablet Take 0.1 mg by mouth 2 (two) times daily.       Marland Kitchen ezetimibe (ZETIA) 10 MG tablet Take 10 mg by mouth daily.      . fish oil-omega-3 fatty acids 1000 MG capsule Take 1 g by mouth daily.       . hydrochlorothiazide (HYDRODIURIL) 25 MG tablet Take 25 mg by  mouth daily.      . nitroGLYCERIN (NITROSTAT) 0.4 MG SL tablet Place 1 tablet (0.4 mg total) under the tongue every 5 (five) minutes as needed for chest pain.  20 tablet  0   No current facility-administered medications for this visit.    Social history is notable that she is widowed. She has 5 children 11 grandchildren and 5 great-grandchildren. She completed 12th grade education. 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 There is no tobacco use.  Family History  Problem Relation Age of Onset  . Hypertension Father   . Heart disease Father     pacemaker- followedby Dr. Royann Shivers  . Diabetes Sister   . Hypertension Sister   . Diabetes Sister   . Hypertension Sister   . Hypertension Daughter   . Hypertension Daughter     ROS is negative for fever chills or night sweats. She denies any significant visual changes. She denies wheezing. She denies presyncope or syncope. She denies tachycardia palpitations. Does note chest wall discomfort. She denies abdominal pain. There is no change in bowel or bladder habits. He does note intermittent right knee swelling. 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 Other system review is negative.  PE BP 132/80  Ht 5' 5.5" (1.664 m)  Wt 168 lb 9.6 oz (76.476 kg)  BMI 27.62 kg/m2 General: Alert, oriented, no distress.  Skin: normal turgor, no rashes HEENT: Normocephalic, atraumatic. Pupils round and reactive; sclera anicteric; Fundi mild arteriolar narrowing without hemorrhages or exudates. Nose without nasal septal hypertrophy Mouth/Parynx benign; Mallinpatti scale 3 Neck: No JVD, no carotid briuts Lungs: clear to ausculatation and percussion; no wheezing or rales Chest wall was tender over the right costochondral region. Heart: RRR, s1 s2 normal over 6 systolic murmur at the Abdomen: soft, nontender; no hepatosplenomehaly, BS+; abdominal  aorta nontender and not dilated by palpation. Pulses 2+ Extremities: Mild varicosities left leg greater than right; no clubbing cyanosis or edema, Homan's sign negative  Neurologic: grossly nonfocal Psychologic: normal affect and mood.   ECG: Normal sinus rhythm at 61 beats per minute. T wave abnormality in the inferolateral leads. QTc interval normal at 46 Christine.  LABS:  BMET    Component Value Date/Time   NA 138 07/10/2013 0832   K 4.1 07/10/2013 0832   CL 103 07/10/2013 0832   CO2 26 07/10/2013 0832   GLUCOSE 104* 07/10/2013 0832   BUN 22 07/10/2013 0832   CREATININE 1.27* 07/10/2013 0832   CREATININE 1.78* 07/27/2012 1958   CALCIUM 9.8 07/10/2013 0832   GFRNONAA 29* 07/27/2012 1958   GFRAA 34* 07/27/2012 1958     Hepatic Function Panel     Component Value Date/Time   PROT 7.2 07/10/2013 0832   ALBUMIN 4.5 07/10/2013 0832   AST 24 07/10/2013 0832   ALT 23 07/10/2013 0832   ALKPHOS 70 07/10/2013 0832   BILITOT 1.0 07/10/2013 0832     CBC    Component Value Date/Time   WBC 5.3 06/27/2013 1125   WBC 7.5 03/16/2012 1611   RBC 4.35 06/27/2013 1125   RBC 4.47 03/16/2012 1611   HGB 12.3 06/27/2013 1125   HGB 11.9* 03/16/2012 1611   HCT 36.1 06/27/2013 1125   HCT 39.4 03/16/2012 1611   PLT 223 06/27/2013 1125   MCV 83.0 06/27/2013 1125   MCV 88.2 03/16/2012 1611   MCH 28.3 06/27/2013 1125   MCH 26.6* 03/16/2012 1611   MCHC 34.1 06/27/2013 1125   MCHC 30.2* 03/16/2012 1611   RDW 14.1 06/27/2013 1125   LYMPHSABS 1.9 06/27/2013 1125   MONOABS 0.4 06/27/2013 1125   EOSABS 0.1 06/27/2013 1125   BASOSABS 0.0 06/27/2013 1125     BNP No results found for this basename: probnp    Lipid Panel     Component Value Date/Time   CHOL 140 04/10/2013 1115   TRIG 210* 07/10/2013 0832   TRIG 117 04/10/2013 1115   HDL 43 04/10/2013 1115   CHOLHDL 3.3 04/10/2013 1115   VLDL 23 04/10/2013 1115   LDLCALC 97 07/10/2013 0832   LDLCALC 74 04/10/2013 1115     RADIOLOGY: No results found.   ASSESSMENT AND PLAN: My impression is  that Christine. Hogan is a 67 year old female who has a history of hypertension for at least 10 years as well as a history of hyperlipidemia. She does have mild renal insufficiency. In the past her creatinine had been 1.78 but this had improved recently to 1.45. I suspect her chest pain most likely is of musculoskeletal etiology. She does have T wave abnormalities in the inferolateral leads. I am recommending that she undergo Lexiscan Myoview study to make certain her chest pain and  ECG changes and not due to ischemia. I am scheduling her for a 2-D echo Doppler study to evaluate both systolic as well as diastolic function. Laboratory will be checked the did suggest a short trial of over-the-counter Aleve renal function will need to be reassessed. I will see her back in the office in followup of the above studies and further recommendations will be made at that time.   Lennette Bihari, MD, [facility] 07/26/2013 12:43 PM

## 2013-07-28 ENCOUNTER — Ambulatory Visit (INDEPENDENT_AMBULATORY_CARE_PROVIDER_SITE_OTHER): Payer: PRIVATE HEALTH INSURANCE | Admitting: Physician Assistant

## 2013-07-28 ENCOUNTER — Ambulatory Visit: Payer: PRIVATE HEALTH INSURANCE | Admitting: Cardiovascular Disease

## 2013-07-28 ENCOUNTER — Encounter: Payer: Self-pay | Admitting: *Deleted

## 2013-07-28 VITALS — BP 162/94 | HR 76 | Temp 98.5°F | Resp 18 | Ht 65.0 in | Wt 169.8 lb

## 2013-07-28 DIAGNOSIS — J3489 Other specified disorders of nose and nasal sinuses: Secondary | ICD-10-CM

## 2013-07-28 DIAGNOSIS — H6690 Otitis media, unspecified, unspecified ear: Secondary | ICD-10-CM

## 2013-07-28 DIAGNOSIS — R0981 Nasal congestion: Secondary | ICD-10-CM

## 2013-07-28 DIAGNOSIS — H6692 Otitis media, unspecified, left ear: Secondary | ICD-10-CM

## 2013-07-28 MED ORDER — AMOXICILLIN 875 MG PO TABS: 875.0000 mg | ORAL_TABLET | Freq: Two times a day (BID) | ORAL | Status: DC

## 2013-07-28 MED ORDER — IPRATROPIUM BROMIDE 0.03 % NA SOLN: 2.0000 | Freq: Two times a day (BID) | NASAL | Status: DC

## 2013-07-28 NOTE — Progress Notes (Signed)
Quick Note:  nuc result note sent to patient. ______ 

## 2013-07-28 NOTE — Progress Notes (Signed)
  Subjective:    Patient ID: Christine Hogan, female    DOB: Apr 08, 1946, 67 y.o.   MRN: [id number]  HPI 67 year old female presents for evaluation of left ear pain. States symptoms started suddenly yesterday and have progressively worsened throughout the night.  Admits it has been intermittently painful and associated with fullness and muffled hearing.  Denies any drainage from ear. Had a cold about 1.5 weeks ago that resolved spontaneously - admits she has now developed a stuffy nose with congestion.  Denies fever, chills, dizziness, headache, tinnitus, nausea or vomiting.   Patient has hx of ear infections "years" ago. No hx of cerumen impaction.    She is otherwise doing well with no other concerns today.     Review of Systems  Constitutional: Negative for fever and chills.  HENT: Positive for ear pain (left), congestion and rhinorrhea. Negative for sore throat and postnasal drip.   Respiratory: Negative for cough.   Gastrointestinal: Negative for nausea and vomiting.  Neurological: Negative for dizziness and headaches.       Objective:   Physical Exam  Constitutional: She appears well-developed and well-nourished.  HENT:  Head: Normocephalic and atraumatic.  Right Ear: Hearing, tympanic membrane, external ear and ear canal normal.  Left Ear: External ear and ear canal normal. No drainage. Tympanic membrane is erythematous. Tympanic membrane is not perforated. A middle ear effusion is present.  Mouth/Throat: Oropharynx is clear and moist. No oropharyngeal exudate.  Eyes: Conjunctivae are normal.  Neck: Normal range of motion.  Cardiovascular: Normal rate, regular rhythm and normal heart sounds.   Pulmonary/Chest: Effort normal and breath sounds normal.          Assessment & Plan:  Otitis media, left - Plan: amoxicillin (AMOXIL) 875 MG tablet  Nasal congestion - Plan: ipratropium (ATROVENT) 0.03 % nasal spray  Start amoxicillin 875 mg bid x 10 days Atrovent NS twice daily to  help with congestion and ETD RTC if symptoms worsen or fail to improve .

## 2013-07-28 NOTE — Patient Instructions (Signed)

## 2013-07-29 ENCOUNTER — Encounter: Payer: Self-pay | Admitting: Cardiovascular Disease

## 2013-07-29 ENCOUNTER — Ambulatory Visit (INDEPENDENT_AMBULATORY_CARE_PROVIDER_SITE_OTHER): Payer: PRIVATE HEALTH INSURANCE | Admitting: Cardiovascular Disease

## 2013-07-29 VITALS — BP 160/90 | Ht 65.5 in | Wt 172.8 lb

## 2013-07-29 DIAGNOSIS — E785 Hyperlipidemia, unspecified: Secondary | ICD-10-CM

## 2013-07-29 DIAGNOSIS — N189 Chronic kidney disease, unspecified: Secondary | ICD-10-CM

## 2013-07-29 DIAGNOSIS — I10 Essential (primary) hypertension: Secondary | ICD-10-CM

## 2013-07-29 DIAGNOSIS — R079 Chest pain, unspecified: Secondary | ICD-10-CM

## 2013-07-29 DIAGNOSIS — Z79899 Other long term (current) drug therapy: Secondary | ICD-10-CM

## 2013-07-29 MED ORDER — ATORVASTATIN CALCIUM 80 MG PO TABS: 80.0000 mg | ORAL_TABLET | Freq: Every day | ORAL | Status: DC

## 2013-07-29 MED ORDER — OMEGA-3 FATTY ACIDS 1000 MG PO CAPS: 2.0000 g | ORAL_CAPSULE | Freq: Every day | ORAL | Status: DC

## 2013-07-29 NOTE — Patient Instructions (Addendum)
Your physician has recommended you make the following change in your medication: Increase the atorvastatin to 80 mg. Increase the  Fish oil to 2 daily.  Your physician recommends that you return for lab work in: 3 months.  Your physician recommends that you schedule a follow-up appointment in: 6 months.

## 2013-07-29 NOTE — Progress Notes (Signed)
Patient ID: Christine Hogan, female   DOB: August 11, 1946, 67 y.o.   MRN: [id number]     HPI: Christine Hogan presents to the office today for cardiology followup evaluation. I saw her approximately one month ago through the courtesy of Dr. Jeanice Lim for evaluation of chest pain. Christine Hogan admits has at least a 10 year history of hypertension. She also is a history of hyperlipidemia as well as arthritis of. She is followed at the [facility]. She does have issues with arthritis and has recently experienced some right knee swelling and some difficulty with her left foot. Recently, she had developed right-sided chest discomfort not definitively exertional described as occasionally sharp and usually  self-limiting. I examined her, I felt her symptoms were most likely musculoskeletal in etiology. I did recommend a short trial of Aleve. However, in light of her cardiac risk factors and her chest pain I did recommend subsequent testing with nuclear imaging and also recommended a 2-D echo Doppler study. Her 2-D echo Doppler study was done on 05/15/2013 and showed mild left ventricular hypertrophy with moderate focal basal hypertrophy of the septum. Systolic function was vigorous. She did not have any outflow tract obstruction. She did have grade 1 diastolic dysfunction but had normal tissue Doppler suggested normal LV filling pressures. There was evidence for moderate mitral annular sclerosis, mostly posterior with mild-to-moderate regurgitation. There also is moderate tricuspid regurgitation. There was evidence for mild pulmonary hypertension. A nuclear perfusion study revealed normal perfusion without scar or ischemia. Ejection fraction was 79%.  Sabbagh states typically her blood pressures at home are in the 1:30 to 40 range. She has not taken her medications this morning. She presents for followup evaluation.  Of note, I also recommended subsequent laboratory be done particularly with her history of renal  insufficiency. One year ago her Cr had been 1.78 in 1 month ago was 1.45;  2 weeks ago repeat laboratory showed a creatinine of 1.27 which was improved. I did recommend a NMR lipid profile and this was notable for increased pole LDL particle numbers 1286. Her total cholesterol was 191 triglycerides are elevated at 210 HDL was 52 and her LDL was 97 but her LDL particle number was elevated at 1580.  Past Medical History  Diagnosis Date  . Hypertension   . Hyperlipidemia   . Arthritis   . Chest pain     Past Surgical History  Procedure Laterality Date  . Hernia repair    . Bunion repair      Allergies  Allergen Reactions  . Codeine Nausea And Vomiting    dizziness    Current Outpatient Prescriptions  Medication Sig Dispense Refill  . amLODipine (NORVASC) 10 MG tablet Take 10 mg by mouth daily.      Marland Kitchen amoxicillin (AMOXIL) 875 MG tablet Take 1 tablet (875 mg total) by mouth 2 (two) times daily.  20 tablet  0  . aspirin 81 MG tablet Take 81 mg by mouth daily.      Marland Kitchen atorvastatin (LIPITOR) 80 MG tablet Take 1 tablet (80 mg total) by mouth daily.  30 tablet  6  . cloNIDine (CATAPRES) 0.1 MG tablet Take 0.1 mg by mouth 2 (two) times daily.       Marland Kitchen ezetimibe (ZETIA) 10 MG tablet Take 10 mg by mouth daily.      . fish oil-omega-3 fatty acids 1000 MG capsule Take 2 capsules (2 g total) by mouth daily.  60 capsule  11  .  hydrochlorothiazide (HYDRODIURIL) 25 MG tablet Take 25 mg by mouth daily.      Marland Kitchen ipratropium (ATROVENT) 0.03 % nasal spray Place 2 sprays into the nose 2 (two) times daily.  30 mL  5  . nitroGLYCERIN (NITROSTAT) 0.4 MG SL tablet Place 1 tablet (0.4 mg total) under the tongue every 5 (five) minutes as needed for chest pain.  20 tablet  0   No current facility-administered medications for this visit.    Social history is notable that she is widowed. She has 5 children 11 grandchildren and 5 great-grandchildren. She completed 12th grade education. 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 There is no tobacco use.  Family History  Problem Relation Age of Onset  . Hypertension Father   . Heart disease Father     pacemaker- followedby Dr. Royann Shivers  . Diabetes Sister   . Hypertension Sister   . Diabetes Sister   . Hypertension Sister   . Hypertension Daughter   . Hypertension Daughter     ROS is negative for fever chills or night sweats. She denies any significant visual changes. She denies wheezing. She denies presyncope or syncope. She denies tachycardia palpitations. Does note chest wall discomfort. She denies abdominal pain. There is no change in bowel or bladder habits. He does note intermittent right knee swelling. 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 Other system review is negative.  PE BP 160/90  Ht 5' 5.5" (1.664 m)  Wt 172 lb 12.8 oz (78.382 kg)  BMI 28.31 kg/m2 General: Alert, oriented, no distress.  Skin: normal turgor, no rashes HEENT: Normocephalic, atraumatic. Pupils round and reactive; sclera anicteric; Fundi mild arteriolar narrowing without hemorrhages or exudates. Nose without nasal septal hypertrophy Mouth/Parynx benign; Mallinpatti scale 3 Neck: No JVD, no carotid briuts Lungs: clear to ausculatation and percussion; no wheezing or rales Chest wall was tender over the right costochondral region. This chest wall tenderness had improved from previously but was still mildly tender to deep palpation. Heart: RRR, s1 s2 normal over 6 systolic murmur at the Abdomen: No renal bruits soft, nontender; no hepatosplenomehaly, BS+; abdominal aorta nontender and not dilated by palpation. Pulses 2+ Extremities: Mild varicosities left leg greater than right; no clubbing cyanosis or edema, Homan's sign negative  Neurologic: grossly nonfocal Psychologic: normal affect and mood.   LABS:  BMET    Component Value Date/Time   NA 138  07/10/2013 0832   K 4.1 07/10/2013 0832   CL 103 07/10/2013 0832   CO2 26 07/10/2013 0832   GLUCOSE 104* 07/10/2013 0832   BUN 22 07/10/2013 0832   CREATININE 1.27* 07/10/2013 0832   CREATININE 1.78* 07/27/2012 1958   CALCIUM 9.8 07/10/2013 0832   GFRNONAA 29* 07/27/2012 1958   GFRAA 34* 07/27/2012 1958     Hepatic Function Panel     Component Value Date/Time   PROT 7.2 07/10/2013 0832   ALBUMIN 4.5 07/10/2013 0832   AST 24 07/10/2013 0832   ALT 23 07/10/2013 0832   ALKPHOS 70 07/10/2013 0832   BILITOT 1.0 07/10/2013 0832     CBC    Component Value Date/Time   WBC 5.3 06/27/2013 1125   WBC 7.5 03/16/2012 1611   RBC 4.35 06/27/2013 1125   RBC 4.47 03/16/2012 1611   HGB 12.3 06/27/2013 1125   HGB 11.9* 03/16/2012 1611   HCT 36.1 06/27/2013 1125   HCT 39.4  03/16/2012 1611   PLT 223 06/27/2013 1125   MCV 83.0 06/27/2013 1125   MCV 88.2 03/16/2012 1611   MCH 28.3 06/27/2013 1125   MCH 26.6* 03/16/2012 1611   MCHC 34.1 06/27/2013 1125   MCHC 30.2* 03/16/2012 1611   RDW 14.1 06/27/2013 1125   LYMPHSABS 1.9 06/27/2013 1125   MONOABS 0.4 06/27/2013 1125   EOSABS 0.1 06/27/2013 1125   BASOSABS 0.0 06/27/2013 1125     BNP No results found for this basename: probnp    Lipid Panel     Component Value Date/Time   CHOL 140 04/10/2013 1115   TRIG 210* 07/10/2013 0832   TRIG 117 04/10/2013 1115   HDL 43 04/10/2013 1115   CHOLHDL 3.3 04/10/2013 1115   VLDL 23 04/10/2013 1115   LDLCALC 97 07/10/2013 0832   LDLCALC 74 04/10/2013 1115     RADIOLOGY: No results found.   ASSESSMENT AND PLAN: My impression is that Christine. Brinton is a 67 year old female who has a history of hypertension for at least 10 years as well as a history of hyperlipidemia. She recently has developed chest pain which most likely is musculoskeletal in etiology. A nuclear perfusion study shows normal perfusion without scar or ischemia in her post stress ejection fraction was 79%. Her echo Doppler study does confirm left ventricular hypertrophy with more pronounced  basal [place] they went out outflow tract obstruction. There was grade 1 diastolic dysfunction. She does have mitral annular calcification with mild/moderate MR as well as moderate TR with mild pulmonary hypertension. I did review her laboratory in detail. Presently I am recommending further titration of her atorvastatin 80 mg. She will continue her Zetia as well at 10 mg. I am also recommending she increase her fish oil supplementation to 2 capsules daily. In 3 months I will obtain a followup seen at an NMR lipoprofile. I will see her in 6 months for cardiology reevaluation.  Lennette Bihari, MD, [facility] 07/29/2013 9:26 AM

## 2013-10-09 ENCOUNTER — Other Ambulatory Visit: Payer: Self-pay | Admitting: *Deleted

## 2013-10-09 ENCOUNTER — Telehealth: Payer: Self-pay | Admitting: *Deleted

## 2013-10-09 ENCOUNTER — Encounter: Payer: Self-pay | Admitting: *Deleted

## 2013-10-09 DIAGNOSIS — Z79899 Other long term (current) drug therapy: Secondary | ICD-10-CM

## 2013-10-09 DIAGNOSIS — E785 Hyperlipidemia, unspecified: Secondary | ICD-10-CM

## 2013-10-09 NOTE — Telephone Encounter (Signed)
Message copied by Gaynelle Cage on Thu Oct 09, 2013 11:28 AM ------      Message from: Gaynelle Cage.      Created: Tue Jul 29, 2013  9:27 AM       Release labs mail to patient ------

## 2013-10-09 NOTE — Telephone Encounter (Signed)
Released lab order and mailed to patient to get blood work draw.

## 2013-12-09 ENCOUNTER — Other Ambulatory Visit: Payer: Self-pay | Admitting: Family Medicine

## 2013-12-09 MED ORDER — EZETIMIBE 10 MG PO TABS: 10.0000 mg | ORAL_TABLET | Freq: Every day | ORAL | Status: DC

## 2013-12-09 NOTE — Telephone Encounter (Signed)
Rx Refilled  

## 2013-12-11 ENCOUNTER — Telehealth: Payer: Self-pay | Admitting: *Deleted

## 2013-12-11 MED ORDER — NITROGLYCERIN 0.4 MG SL SUBL: 0.4000 mg | SUBLINGUAL_TABLET | SUBLINGUAL | Status: DC | PRN

## 2013-12-11 NOTE — Telephone Encounter (Signed)
Med refilled, pt is due for ov

## 2013-12-17 ENCOUNTER — Ambulatory Visit (INDEPENDENT_AMBULATORY_CARE_PROVIDER_SITE_OTHER): Payer: Medicare Other | Admitting: Emergency Medicine

## 2013-12-17 VITALS — BP 158/84 | HR 85 | Temp 98.4°F | Ht 65.0 in | Wt 171.1 lb

## 2013-12-17 DIAGNOSIS — S335XXA Sprain of ligaments of lumbar spine, initial encounter: Secondary | ICD-10-CM | POA: Diagnosis not present

## 2013-12-17 MED ORDER — TRAMADOL HCL 50 MG PO TABS: 50.0000 mg | ORAL_TABLET | Freq: Three times a day (TID) | ORAL | Status: DC | PRN

## 2013-12-17 MED ORDER — MELOXICAM 15 MG PO TABS: 15.0000 mg | ORAL_TABLET | Freq: Every day | ORAL | Status: DC

## 2013-12-17 MED ORDER — CYCLOBENZAPRINE HCL 5 MG PO TABS: 5.0000 mg | ORAL_TABLET | Freq: Three times a day (TID) | ORAL | Status: DC | PRN

## 2013-12-17 NOTE — Patient Instructions (Signed)
Lumbosacral Strain Lumbosacral strain is a strain of any of the parts that make up your lumbosacral vertebrae. Your lumbosacral vertebrae are the bones that make up the lower third of your backbone. Your lumbosacral vertebrae are held together by muscles and tough, fibrous tissue (ligaments).  CAUSES  A sudden blow to your back can cause lumbosacral strain. Also, anything that causes an excessive stretch of the muscles in the low back can cause this strain. This is typically seen when people exert themselves strenuously, fall, lift heavy objects, bend, or crouch repeatedly. RISK FACTORS  Physically demanding work.  Participation in pushing or pulling sports or sports that require sudden twist of the back (tennis, golf, baseball).  Weight lifting.  Excessive lower back curvature.  Forward-tilted pelvis.  Weak back or abdominal muscles or both.  Tight hamstrings. SIGNS AND SYMPTOMS  Lumbosacral strain may cause pain in the area of your injury or pain that moves (radiates) down your leg.  DIAGNOSIS Your health care provider can often diagnose lumbosacral strain through a physical exam. In some cases, you may need tests such as X-ray exams.  TREATMENT  Treatment for your lower back injury depends on many factors that your clinician will have to evaluate. However, most treatment will include the use of anti-inflammatory medicines. HOME CARE INSTRUCTIONS   Avoid hard physical activities (tennis, racquetball, waterskiing) if you are not in proper physical condition for it. This may aggravate or create problems.  If you have a back problem, avoid sports requiring sudden body movements. Swimming and walking are generally safer activities.  Maintain good posture.  Maintain a healthy weight.  For acute conditions, you may put ice on the injured area.  Put ice in a plastic bag.  Place a towel between your skin and the bag.  Leave the ice on for 20 minutes, 2 3 times a day.  When the  low back starts healing, stretching and strengthening exercises may be recommended. SEEK MEDICAL CARE IF:  Your back pain is getting worse.  You experience severe back pain not relieved with medicines. SEEK IMMEDIATE MEDICAL CARE IF:   You have numbness, tingling, weakness, or problems with the use of your arms or legs.  There is a change in bowel or bladder control.  You have increasing pain in any area of the body, including your belly (abdomen).  You notice shortness of breath, dizziness, or feel faint.  You feel sick to your stomach (nauseous), are throwing up (vomiting), or become sweaty.  You notice discoloration of your toes or legs, or your feet get very cold. MAKE SURE YOU:   Understand these instructions.  Will watch your condition.  Will get help right away if you are not doing well or get worse. Document Released: 08/02/2005 Document Revised: 08/13/2013 Document Reviewed: 06/11/2013 Exit[employer] Patient Information 2014 [employer].  

## 2013-12-17 NOTE — Progress Notes (Signed)
[facility] [address], [place] [address] [phone]  Date:  12/17/2013   Name:  Christine Hogan   DOB:  February 25, 1946   MRN:  [id number]  PCP:  Leo Grosser, MD    Chief Complaint: Back Pain   History of Present Illness:  Christine Hogan is a 68 y.o. very pleasant female patient who presents with the following:  No history of injury.  Has three day history of pain in left lower back. Non radiating.  No neuro symptoms.  Worse with ambulation and standing up.  No history of prior injury.  Works as a Lawyer.  No improvement with over the counter medications or other home remedies. Denies other complaint or health concern today.   Patient Active Problem List   Diagnosis Date Noted  . Chronic renal insufficiency 06/28/2013  . Chest pain 06/27/2013  . Hypertension   . Hyperlipidemia     Past Medical History  Diagnosis Date  . Hypertension   . Hyperlipidemia   . Arthritis   . Chest pain     Past Surgical History  Procedure Laterality Date  . Hernia repair    . Bunion repair      History  Substance Use Topics  . Smoking status: Never Smoker   . Smokeless tobacco: Never Used  . Alcohol Use: .5 - 1 oz/week    1-2 drink(s) per week    Family History  Problem Relation Age of Onset  . Hypertension Father   . Heart disease Father     pacemaker- followedby Dr. Royann Shivers  . Diabetes Sister   . Hypertension Sister   . Diabetes Sister   . Hypertension Sister   . Hypertension Daughter   . Hypertension Daughter     Allergies  Allergen Reactions  . Codeine Nausea And Vomiting    dizziness    Medication list has been reviewed and updated.  Current Outpatient Prescriptions on File Prior to Visit  Medication Sig Dispense Refill  . amLODipine (NORVASC) 10 MG tablet Take 10 mg by mouth daily.      Marland Kitchen amoxicillin (AMOXIL) 875 MG tablet Take 1 tablet (875 mg total) by mouth 2 (two) times daily.  20 tablet  0  . aspirin 81 MG tablet Take 81 mg by  mouth daily.      Marland Kitchen atorvastatin (LIPITOR) 80 MG tablet Take 1 tablet (80 mg total) by mouth daily.  30 tablet  6  . cloNIDine (CATAPRES) 0.1 MG tablet Take 0.1 mg by mouth 2 (two) times daily.       Marland Kitchen ezetimibe (ZETIA) 10 MG tablet Take 1 tablet (10 mg total) by mouth daily.  30 tablet  3  . fish oil-omega-3 fatty acids 1000 MG capsule Take 2 capsules (2 g total) by mouth daily.  60 capsule  11  . hydrochlorothiazide (HYDRODIURIL) 25 MG tablet Take 25 mg by mouth daily.      Marland Kitchen ipratropium (ATROVENT) 0.03 % nasal spray Place 2 sprays into the nose 2 (two) times daily.  30 mL  5  . nitroGLYCERIN (NITROSTAT) 0.4 MG SL tablet Place 1 tablet (0.4 mg total) under the tongue every 5 (five) minutes as needed for chest pain.  20 tablet  0   No current facility-administered medications on file prior to visit.    Review of Systems:  As per HPI, otherwise negative.    Physical Examination: Filed Vitals:   12/17/13 1814  BP: 158/84  Pulse: 85  Temp: 98.4 F (36.9 C)   Filed Vitals:   12/17/13 1814  Height: 5\' 5"  (1.651 m)  Weight: 171 lb 2 oz (77.622 kg)   Body mass index is 28.48 kg/(m^2). Ideal Body Weight: Weight in (lb) to have BMI = 25: 149.9  GEN: WDWN, NAD, Non-toxic, A & O x 3 HEENT: Atraumatic, Normocephalic. Neck supple. No masses, No LAD. Ears and Nose: No external deformity. CV: RRR, No M/G/R. No JVD. No thrill. No extra heart sounds. PULM: CTA B, no wheezes, crackles, rhonchi. No retractions. No resp. distress. No accessory muscle use. ABD: S, NT, ND, +BS. No rebound. No HSM. EXTR: No c/c/e NEURO Normal gait.  PSYCH: Normally interactive. Conversant. Not depressed or anxious appearing.  Calm demeanor.  BACK:   Tender left para spinous muscles.  Motor and cerebellar intact  Assessment and Plan: Lumbar strain mobic Flexeril Ultram  Signed,  Phillips OdorJeffery Lilja Soland, MD

## 2014-04-27 ENCOUNTER — Other Ambulatory Visit: Payer: Self-pay | Admitting: Family Medicine

## 2014-04-27 MED ORDER — AMLODIPINE BESYLATE 10 MG PO TABS: 10.0000 mg | ORAL_TABLET | Freq: Every day | ORAL | Status: DC

## 2014-04-27 MED ORDER — CLONIDINE HCL 0.1 MG PO TABS: 0.1000 mg | ORAL_TABLET | Freq: Two times a day (BID) | ORAL | Status: DC

## 2014-04-30 ENCOUNTER — Other Ambulatory Visit: Payer: Self-pay | Admitting: Family Medicine

## 2014-04-30 ENCOUNTER — Encounter: Payer: Self-pay | Admitting: Family Medicine

## 2014-04-30 MED ORDER — CLONIDINE HCL 0.1 MG PO TABS: 0.1000 mg | ORAL_TABLET | Freq: Two times a day (BID) | ORAL | Status: DC

## 2014-04-30 MED ORDER — AMLODIPINE BESYLATE 10 MG PO TABS: 10.0000 mg | ORAL_TABLET | Freq: Every day | ORAL | Status: DC

## 2014-05-07 ENCOUNTER — Other Ambulatory Visit: Payer: Medicare Other

## 2014-05-07 DIAGNOSIS — E785 Hyperlipidemia, unspecified: Secondary | ICD-10-CM

## 2014-05-07 DIAGNOSIS — Z79899 Other long term (current) drug therapy: Secondary | ICD-10-CM

## 2014-05-07 DIAGNOSIS — Z0000 Encounter for general adult medical examination without abnormal findings: Secondary | ICD-10-CM

## 2014-05-07 LAB — LIPID PANEL
Cholesterol: 209 mg/dL — ABNORMAL HIGH (ref 0–200)
HDL: 39 mg/dL — ABNORMAL LOW (ref >39)
LDL Cholesterol: 120 mg/dL — ABNORMAL HIGH (ref 0–99)
Total CHOL/HDL Ratio: 5.4 Ratio
Triglycerides: 252 mg/dL — ABNORMAL HIGH (ref <150)
VLDL: 50 mg/dL — ABNORMAL HIGH (ref 0–40)

## 2014-05-07 LAB — COMPLETE METABOLIC PANEL WITH GFR
ALT: 16 U/L (ref 0–35)
AST: 20 U/L (ref 0–37)
Albumin: 3.9 g/dL (ref 3.5–5.2)
Alkaline Phosphatase: 59 U/L (ref 39–117)
BUN: 13 mg/dL (ref 6–23)
CO2: 24 mEq/L (ref 19–32)
Calcium: 9.8 mg/dL (ref 8.4–10.5)
Chloride: 105 mEq/L (ref 96–112)
Creat: 1.22 mg/dL — ABNORMAL HIGH (ref 0.50–1.10)
GFR, Est African American: 53 mL/min — ABNORMAL LOW
GFR, Est Non African American: 46 mL/min — ABNORMAL LOW
Glucose, Bld: 111 mg/dL — ABNORMAL HIGH (ref 70–99)
Potassium: 4.2 mEq/L (ref 3.5–5.3)
Sodium: 140 mEq/L (ref 135–145)
Total Bilirubin: 0.8 mg/dL (ref 0.2–1.2)
Total Protein: 6.7 g/dL (ref 6.0–8.3)

## 2014-05-07 LAB — CBC WITH DIFFERENTIAL/PLATELET
Basophils Absolute: 0 10*3/uL (ref 0.0–0.1)
Basophils Relative: 0 % (ref 0–1)
Eosinophils Absolute: 0.1 10*3/uL (ref 0.0–0.7)
Eosinophils Relative: 2 % (ref 0–5)
HCT: 35.7 % — ABNORMAL LOW (ref 36.0–46.0)
Hemoglobin: 12.1 g/dL (ref 12.0–15.0)
Lymphocytes Relative: 47 % — ABNORMAL HIGH (ref 12–46)
Lymphs Abs: 1.9 10*3/uL (ref 0.7–4.0)
MCH: 28.5 pg (ref 26.0–34.0)
MCHC: 33.9 g/dL (ref 30.0–36.0)
MCV: 84 fL (ref 78.0–100.0)
Monocytes Absolute: 0.3 10*3/uL (ref 0.1–1.0)
Monocytes Relative: 8 % (ref 3–12)
Neutro Abs: 1.8 10*3/uL (ref 1.7–7.7)
Neutrophils Relative %: 43 % (ref 43–77)
Platelets: 204 10*3/uL (ref 150–400)
RBC: 4.25 MIL/uL (ref 3.87–5.11)
RDW: 14.5 % (ref 11.5–15.5)
WBC: 4.1 10*3/uL (ref 4.0–10.5)

## 2014-05-07 LAB — TSH: TSH: 2.783 u[IU]/mL (ref 0.350–4.500)

## 2014-05-12 ENCOUNTER — Ambulatory Visit (INDEPENDENT_AMBULATORY_CARE_PROVIDER_SITE_OTHER): Payer: PRIVATE HEALTH INSURANCE | Admitting: Family Medicine

## 2014-05-12 ENCOUNTER — Encounter: Payer: Self-pay | Admitting: Family Medicine

## 2014-05-12 VITALS — BP 180/96 | HR 76 | Temp 97.4°F | Resp 18 | Ht 65.5 in | Wt 163.0 lb

## 2014-05-12 DIAGNOSIS — Z0000 Encounter for general adult medical examination without abnormal findings: Secondary | ICD-10-CM | POA: Diagnosis not present

## 2014-05-12 DIAGNOSIS — Z23 Encounter for immunization: Secondary | ICD-10-CM

## 2014-05-12 MED ORDER — EZETIMIBE 10 MG PO TABS: 10.0000 mg | ORAL_TABLET | Freq: Every day | ORAL | Status: DC

## 2014-05-12 NOTE — Progress Notes (Signed)
Subjective:    Patient ID: Christine Hogan, female    DOB: [birth year], 68 y.o.   MRN: [id number]  HPI  Patient is here today for complete physical exam. She has a history of hypertension. Her blood pressure today is extremely elevated at 180/96. She denies any chest pain shortness of breath or dyspnea on exertion. Unfortunately she is not taking her amlodipine, clonidine, or hydrochlorothiazide. She states she frequently misses doses and only takes the medication a few times per week. She is overdue for mammogram. She is overdue for a bone density test. She is overdue for a colonoscopy. The patient has had a hysterectomy and therefore does not require a Pap smear. Her most recent labwork as listed below: Lab on 05/07/2014  Component Date Value Ref Range Status  . Cholesterol 05/07/2014 209* 0 - 200 mg/dL Final   Comment: ATP III Classification:                                < 200        mg/dL        Desirable                               200 - 239     mg/dL        Borderline High                               >= 240        mg/dL        High                             . Triglycerides 05/07/2014 252* <150 mg/dL Final  . HDL 05/07/2014 39* >39 mg/dL Final  . Total CHOL/HDL Ratio 05/07/2014 5.4   Final  . VLDL 05/07/2014 50* 0 - 40 mg/dL Final  . LDL Cholesterol 05/07/2014 120* 0 - 99 mg/dL Final   Comment:                            Total Cholesterol/HDL Ratio:CHD Risk                                                 Coronary Heart Disease Risk Table                                                                 Men       Women                                   1/2 Average Risk              3.4        3.3  Average Risk              5.0        4.4                                    2X Average Risk              9.6        7.1                                    3X Average Risk             23.4       11.0                          Use the calculated Patient  Ratio above and the CHD Risk table                           to determine the patient's CHD Risk.                          ATP III Classification (LDL):                                < 100        mg/dL         Optimal                               100 - 129     mg/dL         Near or Above Optimal                               130 - 159     mg/dL         Borderline High                               160 - 189     mg/dL         High                                > 190        mg/dL         Very High                             . WBC 05/07/2014 4.1  4.0 - 10.5 K/uL Final  . RBC 05/07/2014 4.25  3.87 - 5.11 MIL/uL Final  . Hemoglobin 05/07/2014 12.1  12.0 - 15.0 g/dL Final  . HCT 05/07/2014 35.7* 36.0 - 46.0 % Final  . MCV 05/07/2014 84.0  78.0 - 100.0 fL Final  . MCH 05/07/2014 28.5  26.0 - 34.0 pg Final  . MCHC 05/07/2014 33.9  30.0 - 36.0 g/dL Final  . RDW 05/07/2014 14.5  11.5 - 15.5 % Final  .  Platelets 05/07/2014 204  150 - 400 K/uL Final  . Neutrophils Relative % 05/07/2014 43  43 - 77 % Final  . Neutro Abs 05/07/2014 1.8  1.7 - 7.7 K/uL Final  . Lymphocytes Relative 05/07/2014 47* 12 - 46 % Final  . Lymphs Abs 05/07/2014 1.9  0.7 - 4.0 K/uL Final  . Monocytes Relative 05/07/2014 8  3 - 12 % Final  . Monocytes Absolute 05/07/2014 0.3  0.1 - 1.0 K/uL Final  . Eosinophils Relative 05/07/2014 2  0 - 5 % Final  . Eosinophils Absolute 05/07/2014 0.1  0.0 - 0.7 K/uL Final  . Basophils Relative 05/07/2014 0  0 - 1 % Final  . Basophils Absolute 05/07/2014 0.0  0.0 - 0.1 K/uL Final  . Smear Review 05/07/2014 Criteria for review not met   Final  . TSH 05/07/2014 2.783  0.350 - 4.500 uIU/mL Final  . Sodium 05/07/2014 140  135 - 145 mEq/L Final  . Potassium 05/07/2014 4.2  3.5 - 5.3 mEq/L Final  . Chloride 05/07/2014 105  96 - 112 mEq/L Final  . CO2 05/07/2014 24  19 - 32 mEq/L Final  . Glucose, Bld 05/07/2014 111* 70 - 99 mg/dL Final  . BUN 05/07/2014 13  6 - 23 mg/dL Final  . Creat  05/07/2014 1.22* 0.50 - 1.10 mg/dL Final  . Total Bilirubin 05/07/2014 0.8  0.2 - 1.2 mg/dL Final  . Alkaline Phosphatase 05/07/2014 59  39 - 117 U/L Final  . AST 05/07/2014 20  0 - 37 U/L Final  . ALT 05/07/2014 16  0 - 35 U/L Final  . Total Protein 05/07/2014 6.7  6.0 - 8.3 g/dL Final  . Albumin 05/07/2014 3.9  3.5 - 5.2 g/dL Final  . Calcium 05/07/2014 9.8  8.4 - 10.5 mg/dL Final  . GFR, Est African American 05/07/2014 53*  Final  . GFR, Est Non African American 05/07/2014 46*  Final   Comment:                            The estimated GFR is a calculation valid for adults (>=36 years old)                          that uses the CKD-EPI algorithm to adjust for age and sex. It is                            not to be used for children, pregnant women, hospitalized patients,                             patients on dialysis, or with rapidly changing kidney function.                          According to the NKDEP, eGFR >89 is normal, 60-89 shows mild                          impairment, 30-59 shows moderate impairment, 15-29 shows severe                          impairment and <15 is ESRD.  Patient is also overdue for a Pneumovax, Prevnar, Zostavax, and Tdap. Past Medical History  Diagnosis Date  . Hypertension   . Hyperlipidemia   . Arthritis   . Chest pain    Past Surgical History  Procedure Laterality Date  . Hernia repair    . Bunion repair    . Abdominal hysterectomy      TAH/BSO (menorrhagia)   Current Outpatient Prescriptions on File Prior to Visit  Medication Sig Dispense Refill  . amLODipine (NORVASC) 10 MG tablet Take 1 tablet (10 mg total) by mouth daily.  90 tablet  0  . aspirin 81 MG tablet Take 81 mg by mouth daily.      Marland Kitchen atorvastatin (LIPITOR) 80 MG tablet Take 1 tablet (80 mg total) by mouth daily.  30 tablet  6  . cloNIDine (CATAPRES) 0.1 MG tablet Take 1 tablet (0.1 mg total) by mouth 2 (two) times daily.  180 tablet  0  . fish  oil-omega-3 fatty acids 1000 MG capsule Take 2 capsules (2 g total) by mouth daily.  60 capsule  11  . hydrochlorothiazide (HYDRODIURIL) 25 MG tablet Take 25 mg by mouth daily.      . nitroGLYCERIN (NITROSTAT) 0.4 MG SL tablet Place 1 tablet (0.4 mg total) under the tongue every 5 (five) minutes as needed for chest pain.  20 tablet  0   No current facility-administered medications on file prior to visit.   Allergies  Allergen Reactions  . Codeine Nausea And Vomiting    dizziness   History   Social History  . Marital Status: Divorced    Spouse Name: N/A    Number of Children: N/A  . Years of Education: N/A   Occupational History  . Not on file.   Social History Main Topics  . Smoking status: Never Smoker   . Smokeless tobacco: Never Used  . Alcohol Use: 0.5 - 1.0 oz/week    1-2 drink(s) per week  . Drug Use: No  . Sexual Activity: Yes   Other Topics Concern  . Not on file   Social History Narrative  . No narrative on file   Family History  Problem Relation Age of Onset  . Hypertension Father   . Heart disease Father     pacemaker- followedby Dr. Sallyanne Kuster  . Diabetes Sister   . Hypertension Sister   . Diabetes Sister   . Hypertension Sister   . Hypertension Daughter   . Hypertension Daughter      Review of Systems  All other systems reviewed and are negative.      Objective:   Physical Exam  Vitals reviewed. Constitutional: She is oriented to person, place, and time. She appears well-developed and well-nourished. No distress.  HENT:  Head: Normocephalic and atraumatic.  Right Ear: External ear normal.  Left Ear: External ear normal.  Nose: Nose normal.  Mouth/Throat: Oropharynx is clear and moist. No oropharyngeal exudate.  Eyes: EOM are normal. Pupils are equal, round, and reactive to light. Right eye exhibits no discharge. Left eye exhibits no discharge. No scleral icterus.  Neck: Normal range of motion. Neck supple. No JVD present. No tracheal  deviation present. No thyromegaly present.  Cardiovascular: Normal rate, regular rhythm, normal heart sounds and intact distal pulses.  Exam reveals no gallop and no friction rub.   No murmur heard. Pulmonary/Chest: Effort normal and breath sounds normal. No stridor. No respiratory distress. She has no wheezes. She has no rales. She exhibits no tenderness.  Abdominal:  Soft. Bowel sounds are normal. She exhibits no distension and no mass. There is no tenderness. There is no rebound and no guarding.  Musculoskeletal: Normal range of motion. She exhibits no edema and no tenderness.  Lymphadenopathy:    She has no cervical adenopathy.  Neurological: She is alert and oriented to person, place, and time. She has normal reflexes. She displays normal reflexes. No cranial nerve deficit. She exhibits normal muscle tone. Coordination normal.  Skin: Skin is warm. No rash noted. She is not diaphoretic. No erythema. No pallor.  Psychiatric: She has a normal mood and affect. Her behavior is normal. Judgment and thought content normal.   breast exam is normal        Assessment & Plan:  1. Routine general medical examination at a health care facility Physical exam is normal except for elevated blood pressure. I stressed the need for compliance. I asked the patient take all of her medication every day. I like to recheck her blood pressure in 2 weeks. Typically when the patient takes her medication, her blood pressures well controlled. Patient will be scheduled for a screening mammogram, a bone density, and a colonoscopy. Patient received Prevnar 13 and an office. She will receive Pneumovax 23 in one year. I recommended Zostavax. The patient will inquire as to the cost and then decide if she wasn't medication. He also had a long discussion regarding a low carbohydrate, low saturated fat diet to help address her prediabetes and her elevated trigs. - MM Digital Screening; Future - DG Bone Density; Future -  Ambulatory referral to Gastroenterology  2. Need for prophylactic vaccination and inoculation against unspecified single disease - Pneumococcal conjugate vaccine 13-valent IM

## 2014-05-18 ENCOUNTER — Other Ambulatory Visit: Payer: Self-pay | Admitting: Family Medicine

## 2014-05-18 DIAGNOSIS — N189 Chronic kidney disease, unspecified: Secondary | ICD-10-CM

## 2014-05-18 DIAGNOSIS — Z780 Asymptomatic menopausal state: Secondary | ICD-10-CM

## 2014-05-18 DIAGNOSIS — Z0000 Encounter for general adult medical examination without abnormal findings: Secondary | ICD-10-CM

## 2014-06-03 ENCOUNTER — Encounter: Payer: Self-pay | Admitting: Internal Medicine

## 2014-08-04 ENCOUNTER — Encounter: Payer: PRIVATE HEALTH INSURANCE | Admitting: Internal Medicine

## 2014-12-11 ENCOUNTER — Telehealth: Payer: Self-pay | Admitting: Family Medicine

## 2014-12-11 NOTE — Telephone Encounter (Signed)
Received documentation and forwarded to PCP.

## 2014-12-11 NOTE — Telephone Encounter (Signed)
Patient is calling to get a letter and pick it up regarding a fitness program she wants to join please call her at  [phone]

## 2014-12-11 NOTE — Telephone Encounter (Signed)
Call placed to patient.   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   Advised that MD would need to know what exercises she intends to participate in to know if patient can tolerate.   States that she will have broch[facility] to [facility].   Awaiting documentation.

## 2015-03-26 ENCOUNTER — Ambulatory Visit (INDEPENDENT_AMBULATORY_CARE_PROVIDER_SITE_OTHER): Payer: Commercial Managed Care - HMO | Admitting: Family Medicine

## 2015-03-26 ENCOUNTER — Encounter (INDEPENDENT_AMBULATORY_CARE_PROVIDER_SITE_OTHER): Payer: Self-pay

## 2015-03-26 ENCOUNTER — Telehealth: Payer: Self-pay | Admitting: *Deleted

## 2015-03-26 ENCOUNTER — Encounter: Payer: Self-pay | Admitting: Family Medicine

## 2015-03-26 VITALS — BP 220/118 | HR 84 | Temp 98.7°F | Resp 18 | Ht 65.5 in | Wt 164.0 lb

## 2015-03-26 DIAGNOSIS — R1903 Right lower quadrant abdominal swelling, mass and lump: Secondary | ICD-10-CM

## 2015-03-26 DIAGNOSIS — I10 Essential (primary) hypertension: Secondary | ICD-10-CM

## 2015-03-26 LAB — CBC WITH DIFFERENTIAL/PLATELET
Basophils Absolute: 0 10*3/uL (ref 0.0–0.1)
Basophils Relative: 0 % (ref 0–1)
Eosinophils Absolute: 0.1 10*3/uL (ref 0.0–0.7)
Eosinophils Relative: 1 % (ref 0–5)
HCT: 36.2 % (ref 36.0–46.0)
Hemoglobin: 11.9 g/dL — ABNORMAL LOW (ref 12.0–15.0)
Lymphocytes Relative: 45 % (ref 12–46)
Lymphs Abs: 2.3 10*3/uL (ref 0.7–4.0)
MCH: 28.6 pg (ref 26.0–34.0)
MCHC: 32.9 g/dL (ref 30.0–36.0)
MCV: 87 fL (ref 78.0–100.0)
MPV: 9.3 fL (ref 8.6–12.4)
Monocytes Absolute: 0.5 10*3/uL (ref 0.1–1.0)
Monocytes Relative: 10 % (ref 3–12)
Neutro Abs: 2.2 10*3/uL (ref 1.7–7.7)
Neutrophils Relative %: 44 % (ref 43–77)
Platelets: 223 10*3/uL (ref 150–400)
RBC: 4.16 MIL/uL (ref 3.87–5.11)
RDW: 14.8 % (ref 11.5–15.5)
WBC: 5 10*3/uL (ref 4.0–10.5)

## 2015-03-26 LAB — COMPLETE METABOLIC PANEL WITH GFR
ALT: 22 U/L (ref 0–35)
AST: 27 U/L (ref 0–37)
Albumin: 3.9 g/dL (ref 3.5–5.2)
Alkaline Phosphatase: 64 U/L (ref 39–117)
BUN: 15 mg/dL (ref 6–23)
CO2: 26 mEq/L (ref 19–32)
Calcium: 9.4 mg/dL (ref 8.4–10.5)
Chloride: 104 mEq/L (ref 96–112)
Creat: 1.12 mg/dL — ABNORMAL HIGH (ref 0.50–1.10)
GFR, Est African American: 59 mL/min — ABNORMAL LOW
GFR, Est Non African American: 51 mL/min — ABNORMAL LOW
Glucose, Bld: 84 mg/dL (ref 70–99)
Potassium: 3.7 mEq/L (ref 3.5–5.3)
Sodium: 139 mEq/L (ref 135–145)
Total Bilirubin: 0.6 mg/dL (ref 0.2–1.2)
Total Protein: 7 g/dL (ref 6.0–8.3)

## 2015-03-26 MED ORDER — HYDRALAZINE HCL 25 MG PO TABS: 25.0000 mg | ORAL_TABLET | Freq: Three times a day (TID) | ORAL | Status: DC

## 2015-03-26 NOTE — Progress Notes (Signed)
Subjective:    Patient ID: Christine Hogan, female    DOB: April 26, 1946, 69 y.o.   MRN: [id number]  HPI Patient presents today complaining of a one-month history of a firm hard area in her right abdomen. On examination today there is a hard 2 cm area just superior and to the right of her umbilicus. She is tender to palpation in this area. Coincidentally, her blood pressure was found to be elevated at 220/118. I rechecked her blood pressure and found to be 220/110. She denies any chest pain shortness of breath or dyspnea on exertion. She reports that she is taking her amlodipine, clonidine, and hydrochlorothiazide every day. However she does admit that she is under tremendous stress caring for her 67 year old father. Her son is also undergoing chemotherapy for cancer. Both of these individuals live with her and this has her extremely worried. She also admits that she is drinking too much alcohol. She is drinking 3-4 drinks of hard liquor per day. She is doing this try to help manage with the stress. She denies any headache blurry vision hematuria dysuria or oliguria Past Medical History  Diagnosis Date  . Hypertension   . Hyperlipidemia   . Arthritis   . Chest pain    Past Surgical History  Procedure Laterality Date  . Hernia repair    . Bunion repair    . Abdominal hysterectomy      TAH/BSO (menorrhagia)   Current Outpatient Prescriptions on File Prior to Visit  Medication Sig Dispense Refill  . amLODipine (NORVASC) 10 MG tablet Take 1 tablet (10 mg total) by mouth daily. 90 tablet 0  . aspirin 81 MG tablet Take 81 mg by mouth daily.    Marland Kitchen. atorvastatin (LIPITOR) 80 MG tablet Take 1 tablet (80 mg total) by mouth daily. 30 tablet 6  . cloNIDine (CATAPRES) 0.1 MG tablet Take 1 tablet (0.1 mg total) by mouth 2 (two) times daily. 180 tablet 0  . ezetimibe (ZETIA) 10 MG tablet Take 1 tablet (10 mg total) by mouth daily. 30 tablet 5  . fish oil-omega-3 fatty acids 1000 MG capsule Take 2 capsules (2 g  total) by mouth daily. 60 capsule 11  . hydrochlorothiazide (HYDRODIURIL) 25 MG tablet Take 25 mg by mouth daily.    . nitroGLYCERIN (NITROSTAT) 0.4 MG SL tablet Place 1 tablet (0.4 mg total) under the tongue every 5 (five) minutes as needed for chest pain. 20 tablet 0   No current facility-administered medications on file prior to visit.   Allergies  Allergen Reactions  . Codeine Nausea And Vomiting    dizziness   History   Social History  . Marital Status: Divorced    Spouse Name: N/A  . Number of Children: N/A  . Years of Education: N/A   Occupational History  . Not on file.   Social History Main Topics  . Smoking status: Never Smoker   . Smokeless tobacco: Never Used  . Alcohol Use: 0.5 - 1.0 oz/week    1-2 drink(s) per week  . Drug Use: No  . Sexual Activity: Yes   Other Topics Concern  . Not on file   Social History Narrative      Review of Systems  All other systems reviewed and are negative.      Objective:   Physical Exam  Constitutional: She is oriented to person, place, and time. She appears well-developed and well-nourished.  Cardiovascular: Normal rate, regular rhythm, normal heart sounds and intact distal pulses.  Exam reveals no gallop and no friction rub.   No murmur heard. Pulmonary/Chest: Effort normal and breath sounds normal. No respiratory distress. She has no wheezes. She has no rales. She exhibits no tenderness.  Abdominal: Soft. Bowel sounds are normal. She exhibits mass. She exhibits no distension. There is tenderness. There is no rebound and no guarding.  Neurological: She is alert and oriented to person, place, and time. She has normal reflexes. She displays normal reflexes. No cranial nerve deficit. She exhibits normal muscle tone. Coordination normal.  Vitals reviewed.         Assessment & Plan:  Abdominal mass, RLQ (right lower quadrant) - Plan: CBC with Differential/Platelet, COMPLETE METABOLIC PANEL WITH GFR, CT Abdomen Pelvis  W Contrast  HTN (hypertension), malignant - Plan: CBC with Differential/Platelet, COMPLETE METABOLIC PANEL WITH GFR, hydrALAZINE (APRESOLINE) 25 MG tablet  I will schedule CT of abd and pelvis to evaluate the cause of the hard area in the RLQ of her abd.  Meanwhile, we need to get her blood pressure down. I gave the patient Benicar 40 mg by mouth 1 at 11:00. I will have the patient sit quietly in the exam room. If I can get her systolic blood pressure under 161200 for comfortable with the patient leaving the office. I would like her to continue to take the Benicar 40 mg by mouth daily thereafter. I also called in a prescription for hydralazine 25 mg by mouth 3 times a day. Repeat blood pressure was 190/102   .  Recheck next week.

## 2015-03-26 NOTE — Telephone Encounter (Signed)
Pt called back and aware of appt and will come on Monday to pick up her contrast

## 2015-03-26 NOTE — Telephone Encounter (Signed)
Submitted humana referral thru acuity connect for authorization to [facility] with authorization [id number]:  Requesting provider: Priscille HeidelbergWarren T. Pickard,MD  [facility]  Number of visits:1  Start Date:03/26/15  End Date:09/22/15  Dx:R19.03-Right lower quadrantn abdominal swelling,mass and lump  Type of service: CT  Procedures: 74177- CT abd &pelv w/contrast

## 2015-03-26 NOTE — Telephone Encounter (Signed)
[facility] is [facility] [address] [facility] at 9:00am pt needs to arrive at 8:40am, [facility] her contrast and drink one at 7am and the 2nd at 8am. And NPO after midnight night before her exam. Lmtrc to pt for appt

## 2015-03-29 ENCOUNTER — Encounter: Payer: Self-pay | Admitting: Family Medicine

## 2015-03-31 ENCOUNTER — Ambulatory Visit
Admission: RE | Admit: 2015-03-31 | Discharge: 2015-03-31 | Disposition: A | Discharge status: Home / Self Care | Payer: Commercial Managed Care - HMO | Source: Ambulatory Visit | Attending: Family Medicine | Admitting: Family Medicine

## 2015-03-31 DIAGNOSIS — R1903 Right lower quadrant abdominal swelling, mass and lump: Secondary | ICD-10-CM | POA: Diagnosis not present

## 2015-03-31 DIAGNOSIS — K760 Fatty (change of) liver, not elsewhere classified: Secondary | ICD-10-CM | POA: Diagnosis not present

## 2015-03-31 DIAGNOSIS — K5730 Diverticulosis of large intestine without perforation or abscess without bleeding: Secondary | ICD-10-CM | POA: Diagnosis not present

## 2015-03-31 MED ORDER — IOPAMIDOL (ISOVUE-300) INJECTION 61%
100.0000 mL | Freq: Once | INTRAVENOUS | Status: AC | PRN
  Administered 2015-03-31: 100 mL via INTRAVENOUS

## 2015-04-02 ENCOUNTER — Ambulatory Visit: Payer: Commercial Managed Care - HMO | Admitting: Family Medicine

## 2015-04-02 VITALS — BP 102/60

## 2015-04-02 DIAGNOSIS — Z0130 Encounter for examination of blood pressure without abnormal findings: Secondary | ICD-10-CM

## 2015-04-07 ENCOUNTER — Telehealth: Payer: Self-pay | Admitting: Family Medicine

## 2015-04-07 NOTE — Telephone Encounter (Signed)
Patient would like results of CT. She is worried about the results

## 2015-04-07 NOTE — Telephone Encounter (Signed)
Pt came in to have BP checked - she was not aware that she needed to come in to see you in an ov to discuss her CT - can you review so I can call her back or does she need to make an appt to discuss?

## 2015-04-08 NOTE — Telephone Encounter (Signed)
LMTRC

## 2015-04-08 NOTE — Telephone Encounter (Signed)
CT showed no cancer, however she was supposed to come back in to be seen to further manage her BP.

## 2015-04-09 NOTE — Telephone Encounter (Signed)
Pt called back and aware of results and appt scheduled for MOnday to see pickard

## 2015-04-12 ENCOUNTER — Ambulatory Visit (INDEPENDENT_AMBULATORY_CARE_PROVIDER_SITE_OTHER): Payer: Commercial Managed Care - HMO | Admitting: Family Medicine

## 2015-04-12 ENCOUNTER — Encounter: Payer: Self-pay | Admitting: Family Medicine

## 2015-04-12 VITALS — BP 160/68 | HR 80 | Temp 98.5°F | Resp 16 | Ht 65.5 in | Wt 163.0 lb

## 2015-04-12 DIAGNOSIS — I10 Essential (primary) hypertension: Secondary | ICD-10-CM

## 2015-04-12 MED ORDER — OLMESARTAN MEDOXOMIL 40 MG PO TABS: 40.0000 mg | ORAL_TABLET | Freq: Every day | ORAL | Status: DC

## 2015-04-12 MED ORDER — HYDRALAZINE HCL 50 MG PO TABS: 50.0000 mg | ORAL_TABLET | Freq: Three times a day (TID) | ORAL | Status: DC

## 2015-04-12 NOTE — Progress Notes (Signed)
Subjective:    Patient ID: Christine Hogan, female    DOB: 1946-05-10, 69 y.o.   MRN: [id number]  HPI 03/26/15 Patient presents today complaining of a one-month history of a firm hard area in her right abdomen. On examination today there is a hard 2 cm area just superior and to the right of her umbilicus. She is tender to palpation in this area. Coincidentally, her blood pressure was found to be elevated at 220/118. I rechecked her blood pressure and found to be 220/110. She denies any chest pain shortness of breath or dyspnea on exertion. She reports that she is taking her amlodipine, clonidine, and hydrochlorothiazide every day. However she does admit that she is under tremendous stress caring for her 3 year old father. Her son is also undergoing chemotherapy for cancer. Both of these individuals live with her and this has her extremely worried. She also admits that she is drinking too much alcohol. She is drinking 3-4 drinks of hard liquor per day. She is doing this try to help manage with the stress. She denies any headache blurry vision hematuria dysuria or oliguria.  AT that time, my plan was: I will schedule CT of abd and pelvis to evaluate the cause of the hard area in the RLQ of her abd.  Meanwhile, we need to get her blood pressure down. I gave the patient Benicar 40 mg by mouth 1 at 11:00. I will have the patient sit quietly in the exam room. If I can get her systolic blood pressure under 191 for comfortable with the patient leaving the office. I would like her to continue to take the Benicar 40 mg by mouth daily thereafter. I also called in a prescription for hydralazine 25 mg by mouth 3 times a day.  Repeat blood pressure was 190/102   .  Recheck next week.  04/12/15 Here today for recheck.  CT revealed: IMPRESSION:  1. No abdominal or pelvic soft tissue mass is seen. At the level of  the umbilicus the transverse colon does course transversely across  the abdomen and could create a palpable  abnormality.  2. Diffuse fatty infiltration of the liver.  3. Stable prominent common bile duct since CT from 2009 of doubtful  significance.  4. 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  5. The appendix and terminal ileum are unremarkable. Blood pressure is ranging 150-160/60-70. She is compliant with the Benicar and hydralazine. She denies any chest pain shortness of breath or dyspnea on exertion. She has drastically reduced her alcohol intake although she continues to drink  Past Medical History  Diagnosis Date  . Hypertension   . Hyperlipidemia   . Arthritis   . Chest pain    Past Surgical History  Procedure Laterality Date  . Hernia repair    . Bunion repair    . Abdominal hysterectomy      TAH/BSO (menorrhagia)   Current Outpatient Prescriptions on File Prior to Visit  Medication Sig Dispense Refill  . amLODipine (NORVASC) 10 MG tablet Take 1 tablet (10 mg total) by mouth daily. 90 tablet 0  . aspirin 81 MG tablet Take 81 mg by mouth daily.    Marland Kitchen atorvastatin (LIPITOR) 80 MG tablet Take 1 tablet (80 mg total) by mouth daily. 30 tablet 6  . cloNIDine (CATAPRES) 0.1 MG tablet Take 1 tablet (0.1 mg total) by mouth 2 (two) times daily. 180 tablet 0  . ezetimibe (ZETIA) 10 MG tablet Take 1 tablet (10 mg total) by  mouth daily. 30 tablet 5  . fish oil-omega-3 fatty acids 1000 MG capsule Take 2 capsules (2 g total) by mouth daily. 60 capsule 11  . hydrALAZINE (APRESOLINE) 25 MG tablet Take 1 tablet (25 mg total) by mouth 3 (three) times daily. 90 tablet 1  . hydrochlorothiazide (HYDRODIURIL) 25 MG tablet Take 25 mg by mouth daily.    . nitroGLYCERIN (NITROSTAT) 0.4 MG SL tablet Place 1 tablet (0.4 mg total) under the tongue every 5 (five) minutes as needed for chest pain. 20 tablet 0   No current facility-administered medications on file prior to visit.   Allergies  Allergen Reactions  . Codeine Nausea And Vomiting    dizziness   History   Social  History  . Marital Status: Divorced    Spouse Name: N/A  . Number of Children: N/A  . Years of Education: N/A   Occupational History  . Not on file.   Social History Main Topics  . Smoking status: Never Smoker   . Smokeless tobacco: Never Used  . Alcohol Use: 0.5 - 1.0 oz/week    1-2 drink(s) per week  . Drug Use: No  . Sexual Activity: Yes   Other Topics Concern  . Not on file   Social History Narrative      Review of Systems  All other systems reviewed and are negative.      Objective:   Physical Exam  Constitutional: She is oriented to person, place, and time. She appears well-developed and well-nourished.  Cardiovascular: Normal rate, regular rhythm, normal heart sounds and intact distal pulses.  Exam reveals no gallop and no friction rub.   No murmur heard. Pulmonary/Chest: Effort normal and breath sounds normal. No respiratory distress. She has no wheezes. She has no rales. She exhibits no tenderness.  Abdominal: Soft. Bowel sounds are normal. She exhibits no distension and no mass. There is no tenderness. There is no rebound and no guarding.  Neurological: She is alert and oriented to person, place, and time. She has normal reflexes. No cranial nerve deficit. She exhibits normal muscle tone. Coordination normal.  Vitals reviewed.         Assessment & Plan:  Benign essential HTN - Plan: olmesartan (BENICAR) 40 MG tablet, hydrALAZINE (APRESOLINE) 50 MG tablet  Blood pressure is better although not at goal. Continue Benicar 40 mg by mouth daily. Increase hydralazine to 50 mg by mouth 3 times a day. Recheck blood pressure in 2 weeks

## 2015-05-04 ENCOUNTER — Other Ambulatory Visit: Payer: Self-pay | Admitting: Family Medicine

## 2015-05-04 MED ORDER — CLONIDINE HCL 0.1 MG PO TABS: 0.1000 mg | ORAL_TABLET | Freq: Two times a day (BID) | ORAL | Status: DC

## 2015-05-04 MED ORDER — AMLODIPINE BESYLATE 10 MG PO TABS: 10.0000 mg | ORAL_TABLET | Freq: Every day | ORAL | Status: DC

## 2015-05-04 MED ORDER — HYDROCHLOROTHIAZIDE 25 MG PO TABS: 25.0000 mg | ORAL_TABLET | Freq: Every day | ORAL | Status: DC

## 2015-05-04 NOTE — Telephone Encounter (Signed)
Medication refilled per protocol. 

## 2016-01-02 ENCOUNTER — Emergency Department (HOSPITAL_COMMUNITY)
Admission: EM | Admit: 2016-01-02 | Discharge: 2016-01-02 | Disposition: A | Discharge status: Home / Self Care | Payer: Commercial Managed Care - HMO | Attending: Emergency Medicine | Admitting: Emergency Medicine

## 2016-01-02 ENCOUNTER — Encounter (HOSPITAL_COMMUNITY): Payer: Self-pay | Admitting: *Deleted

## 2016-01-02 DIAGNOSIS — E785 Hyperlipidemia, unspecified: Secondary | ICD-10-CM | POA: Insufficient documentation

## 2016-01-02 DIAGNOSIS — Y998 Other external cause status: Secondary | ICD-10-CM | POA: Diagnosis not present

## 2016-01-02 DIAGNOSIS — M1990 Unspecified osteoarthritis, unspecified site: Secondary | ICD-10-CM | POA: Insufficient documentation

## 2016-01-02 DIAGNOSIS — Y9289 Other specified places as the place of occurrence of the external cause: Secondary | ICD-10-CM | POA: Diagnosis not present

## 2016-01-02 DIAGNOSIS — Y288XXA Contact with other sharp object, undetermined intent, initial encounter: Secondary | ICD-10-CM | POA: Insufficient documentation

## 2016-01-02 DIAGNOSIS — S61210A Laceration without foreign body of right index finger without damage to nail, initial encounter: Secondary | ICD-10-CM | POA: Diagnosis not present

## 2016-01-02 DIAGNOSIS — Z23 Encounter for immunization: Secondary | ICD-10-CM | POA: Insufficient documentation

## 2016-01-02 DIAGNOSIS — I10 Essential (primary) hypertension: Secondary | ICD-10-CM | POA: Diagnosis not present

## 2016-01-02 DIAGNOSIS — Z79899 Other long term (current) drug therapy: Secondary | ICD-10-CM | POA: Diagnosis not present

## 2016-01-02 DIAGNOSIS — Y9389 Activity, other specified: Secondary | ICD-10-CM | POA: Insufficient documentation

## 2016-01-02 DIAGNOSIS — S61219A Laceration without foreign body of unspecified finger without damage to nail, initial encounter: Secondary | ICD-10-CM

## 2016-01-02 MED ORDER — LIDOCAINE HCL (PF) 1 % IJ SOLN
5.0000 mL | Freq: Once | INTRAMUSCULAR | Status: AC
  Administered 2016-01-02: 5 mL via INTRADERMAL
  Filled 2016-01-02: qty 5

## 2016-01-02 MED ORDER — TETANUS-DIPHTH-ACELL PERTUSSIS 5-2.5-18.5 LF-MCG/0.5 IM SUSP
0.5000 mL | Freq: Once | INTRAMUSCULAR | Status: AC
  Administered 2016-01-02: 0.5 mL via INTRAMUSCULAR
  Filled 2016-01-02: qty 0.5

## 2016-01-02 NOTE — ED Notes (Addendum)
Pt reports cutting her  RT index finger on a top of a metal can this AM. Pt needs a tetanus vac.

## 2016-01-02 NOTE — ED Provider Notes (Signed)
CSN: [id number]     Arrival date & time 01/02/16  1003 History  By signing my name below, I, Octavia Heir, attest that this documentation has been prepared under the direction and in the presence of Cheri Fowler, PA-C. Electronically Signed: Octavia Heir, ED Scribe. 01/02/2016. 12:21 PM.    Chief Complaint  Patient presents with  . Finger Injury      The history is provided by the patient. No language interpreter was used.   HPI Comments: Christine Hogan is a 70 y.o. female who has a hx of HTN and hyperlipidemia presents to the Emergency Department complaining of a constant, gradual worsening, moderate, right index finger laceration onset this morning. She notes she cut her finger on a metal can this morning. Pt has not taken any medication to alleviate the pain. Bleeding is controlled. Pt is not up to date on her tetanus shot. Pt denies numbness, tingling, and being on blood thinners.  Past Medical History  Diagnosis Date  . Hypertension   . Hyperlipidemia   . Arthritis   . Chest pain    Past Surgical History  Procedure Laterality Date  . Hernia repair    . Bunion repair    . Abdominal hysterectomy      TAH/BSO (menorrhagia)   Family History  Problem Relation Age of Onset  . Hypertension Father   . Heart disease Father     pacemaker- followedby Dr. Royann Shivers  . Diabetes Sister   . Hypertension Sister   . Diabetes Sister   . Hypertension Sister   . Hypertension Daughter   . Hypertension Daughter    Social History  Substance Use Topics  . Smoking status: Never Smoker   . Smokeless tobacco: Never Used  . Alcohol Use: 0.5 - 1.0 oz/week    1-2 drink(s) per week   OB History    No data available     Review of Systems  Skin: Positive for wound.  Neurological: Negative for numbness.  All other systems reviewed and are negative.     Allergies  Codeine  Home Medications   Prior to Admission medications   Medication Sig Start Date End Date Taking? Authorizing  Provider  amLODipine (NORVASC) 10 MG tablet Take 1 tablet (10 mg total) by mouth daily. 05/04/15   Donita Brooks, MD  aspirin 81 MG tablet Take 81 mg by mouth daily.    Historical Provider, MD  atorvastatin (LIPITOR) 80 MG tablet Take 1 tablet (80 mg total) by mouth daily. 07/29/13   Lennette Bihari, MD  cloNIDine (CATAPRES) 0.1 MG tablet Take 1 tablet (0.1 mg total) by mouth 2 (two) times daily. 05/04/15   Donita Brooks, MD  ezetimibe (ZETIA) 10 MG tablet Take 1 tablet (10 mg total) by mouth daily. 05/12/14   Donita Brooks, MD  fish oil-omega-3 fatty acids 1000 MG capsule Take 2 capsules (2 g total) by mouth daily. 07/29/13   Lennette Bihari, MD  hydrALAZINE (APRESOLINE) 25 MG tablet Take 1 tablet (25 mg total) by mouth 3 (three) times daily. 03/26/15   Donita Brooks, MD  hydrALAZINE (APRESOLINE) 50 MG tablet Take 1 tablet (50 mg total) by mouth 3 (three) times daily. 04/12/15   Donita Brooks, MD  hydrochlorothiazide (HYDRODIURIL) 25 MG tablet Take 1 tablet (25 mg total) by mouth daily. 05/04/15   Donita Brooks, MD  nitroGLYCERIN (NITROSTAT) 0.4 MG SL tablet Place 1 tablet (0.4 mg total) under the tongue every 5 (five) minutes  as needed for chest pain. 12/11/13   Salley Scarlet, MD  olmesartan (BENICAR) 40 MG tablet Take 1 tablet (40 mg total) by mouth daily. 04/12/15   Donita Brooks, MD   Triage vitals: BP 206/119 mmHg  Pulse 75  Temp(Src) 98.1 F (36.7 C) (Oral)  Resp 18  Ht  (1.676 m)  Wt 162 lb (73.483 kg)  BMI 26.16 kg/m2  SpO2 100% Physical Exam  Constitutional: She is oriented to person, place, and time. She appears well-developed and well-nourished.  HENT:  Head: Normocephalic and atraumatic.  Right Ear: External ear normal.  Left Ear: External ear normal.  Eyes: Conjunctivae are normal. No scleral icterus.  Neck: Normal range of motion. No tracheal deviation present.  Cardiovascular:  Capillary refill less than 3 seconds distal to injury.   Pulmonary/Chest:  Effort normal. No respiratory distress.  Abdominal: She exhibits no distension.  Musculoskeletal: Normal range of motion.  Neurological: She is alert and oriented to person, place, and time.  Strength and sensation intact distal to injury.  Skin: Skin is warm and dry.  1 cm curved laceration on distal finger pad of right index finger. Bleeding controlled with pressure.   Psychiatric: She has a normal mood and affect. Her behavior is normal.  Nursing note and vitals reviewed.   ED Course  Procedures   NERVE BLOCK Performed by: Cheri Fowler Consent: Verbal consent obtained. Required items: required blood products, implants, devices, and special equipment available Time out: Immediately prior to procedure a "time out" was called to verify the correct patient, procedure, equipment, support staff and site/side marked as required.  Indication: laceration repair Nerve block body site: right index finger  Preparation: Patient was prepped and draped in the usual sterile fashion. Needle gauge: 24 G Location technique: anatomical landmarks  Local anesthetic: 1% lidocaine without epinephrine  Anesthetic total: 4 ml  Outcome: pain improved Patient tolerance: Patient tolerated the procedure well with no immediate complications.  LACERATION REPAIR Performed by: Cheri Fowler Consent: Verbal consent obtained. Risks and benefits: risks, benefits and alternatives were discussed Patient identity confirmed: provided demographic data Time out performed prior to procedure Prepped and Draped in normal sterile fashion Wound explored Laceration Location: right index finger Laceration Length: 1 cm No Foreign Bodies seen or palpated Anesthesia: digital block Irrigation method: syringe Amount of cleaning: standard Skin closure: 5-0 prolene Number of sutures or staples: 3 Technique: simple interrupted Patient tolerance: Patient tolerated the procedure well with no immediate  complications.  DIAGNOSTIC STUDIES: Oxygen Saturation is 100% on RA, normal by my interpretation.  COORDINATION OF CARE:  11:01 AM Discussed treatment plan which includes suture laceration with pt at bedside and pt agreed to plan.  Labs Review Labs Reviewed - No data to display  Imaging Review No results found. I have personally reviewed and evaluated these images and lab results as part of my medical decision-making.   EKG Interpretation None      MDM  Tetanus updated in ED, UTD. Laceration occurred < 12 hours prior to repair. Tdap updated. Discussed laceration care with pt and answered questions. Pt to f-u for suture removal in 7-10 days and wound check sooner should there be signs of dehiscence or infection. Patient on pressure 206/119; patient reports history of hypertension and she has not taken her a.m. medications. I suspect elevated BP related to missed medication dose and fingerr injury. Pt denies HA, CP, abdominal pain, or any other symptoms. Pt is hemodynamically stable with no complaints prior to  dc.     Final diagnoses:  Laceration of finger, initial encounter   I personally performed the services described in this documentation, which was scribed in my presence. The recorded information has been reviewed and is accurate.    Cheri Fowler, PA-C 01/02/16 1222  Blane Ohara, MD 01/02/16 1539

## 2016-01-02 NOTE — ED Notes (Signed)
ON admission PT's BP 206/119 ,Pt reported  She did not take AM BP meds.

## 2016-01-02 NOTE — Discharge Instructions (Signed)

## 2016-01-11 ENCOUNTER — Other Ambulatory Visit: Payer: Self-pay | Admitting: Family Medicine

## 2016-01-11 ENCOUNTER — Ambulatory Visit (INDEPENDENT_AMBULATORY_CARE_PROVIDER_SITE_OTHER): Payer: Commercial Managed Care - HMO | Admitting: Family Medicine

## 2016-01-11 ENCOUNTER — Encounter: Payer: Self-pay | Admitting: Family Medicine

## 2016-01-11 VITALS — BP 160/98 | HR 76 | Temp 98.3°F | Resp 14 | Ht 65.5 in | Wt 166.0 lb

## 2016-01-11 DIAGNOSIS — M25561 Pain in right knee: Secondary | ICD-10-CM

## 2016-01-11 DIAGNOSIS — I10 Essential (primary) hypertension: Secondary | ICD-10-CM

## 2016-01-11 MED ORDER — AMLODIPINE BESYLATE 10 MG PO TABS: 10.0000 mg | ORAL_TABLET | Freq: Every day | ORAL | Status: DC

## 2016-01-11 NOTE — Progress Notes (Signed)
Subjective:    Patient ID: Christine Hogan, female    DOB: [birth year], 70 y.o.   MRN: [id number]  HPI Sustained a laceration to her right index finger on 2/26 which was closed in the ER with 3, 5-0 prolene sutures.  Here for suture removal.  Laceration on the tip of her right index finger appears well-healed. All 3 sutures were removed without difficulty and the wound was reinforced with Steri-Strips. However she also complains of right knee pain. She has a history of osteoarthritis and has previously undergone arthroscopic surgery on that knee. The pain is described as an aching throbbing sensation. It is worse with prolonged standing or walking. She denies any locking in the knee or laxity in the knee. She is interested in a cortisone injection. Past Medical History  Diagnosis Date  . Hypertension   . Hyperlipidemia   . Arthritis   . Chest pain    Past Surgical History  Procedure Laterality Date  . Hernia repair    . Bunion repair    . Abdominal hysterectomy      TAH/BSO (menorrhagia)   Current Outpatient Prescriptions on File Prior to Visit  Medication Sig Dispense Refill  . amLODipine (NORVASC) 10 MG tablet Take 1 tablet (10 mg total) by mouth daily. 90 tablet 1  . aspirin 81 MG tablet Take 81 mg by mouth daily.    Marland Kitchen. atorvastatin (LIPITOR) 80 MG tablet Take 1 tablet (80 mg total) by mouth daily. 30 tablet 6  . cloNIDine (CATAPRES) 0.1 MG tablet Take 1 tablet (0.1 mg total) by mouth 2 (two) times daily. 180 tablet 1  . ezetimibe (ZETIA) 10 MG tablet Take 1 tablet (10 mg total) by mouth daily. 30 tablet 5  . fish oil-omega-3 fatty acids 1000 MG capsule Take 2 capsules (2 g total) by mouth daily. 60 capsule 11  . hydrALAZINE (APRESOLINE) 25 MG tablet Take 1 tablet (25 mg total) by mouth 3 (three) times daily. 90 tablet 1  . hydrALAZINE (APRESOLINE) 50 MG tablet Take 1 tablet (50 mg total) by mouth 3 (three) times daily. 90 tablet 5  . hydrochlorothiazide (HYDRODIURIL) 25 MG tablet Take  1 tablet (25 mg total) by mouth daily. 90 tablet 1  . nitroGLYCERIN (NITROSTAT) 0.4 MG SL tablet Place 1 tablet (0.4 mg total) under the tongue every 5 (five) minutes as needed for chest pain. 20 tablet 0  . olmesartan (BENICAR) 40 MG tablet Take 1 tablet (40 mg total) by mouth daily. 30 tablet 5   No current facility-administered medications on file prior to visit.   Allergies  Allergen Reactions  . Codeine Nausea And Vomiting    dizziness   Social History   Social History  . Marital Status: Divorced    Spouse Name: N/A  . Number of Children: N/A  . Years of Education: N/A   Occupational History  . Not on file.   Social History Main Topics  . Smoking status: Never Smoker   . Smokeless tobacco: Never Used  . Alcohol Use: 0.5 - 1.0 oz/week    1-2 drink(s) per week  . Drug Use: No  . Sexual Activity: Yes   Other Topics Concern  . Not on file   Social History Narrative     Review of Systems  All other systems reviewed and are negative.      Objective:   Physical Exam  Cardiovascular: Normal rate and regular rhythm.   Pulmonary/Chest: Effort normal and breath sounds normal.  Musculoskeletal:  Right knee: She exhibits decreased range of motion. Tenderness found. Medial joint line and lateral joint line tenderness noted.  Vitals reviewed.         Assessment & Plan:  Right knee pain.  Using sterile technique, the right knee was injected with 2 mL of lidocaine, 2 mL of Marcaine, and 2 mL of 40 mg per mL Kenalog. The patient tolerated the procedure well without complication. I suspect osteoarthritis based on her exam and her history. All 3 sutures were removed without difficulty. I am concerned about her blood pressure. However the patient has discontinued all of her blood pressure medication. I have recommended that she resume her medication immediately. She is obviously very sad. Her son has died within the last year from cancer. She is also lost a niece. I  believe that the patient has neglected herself.

## 2016-10-16 ENCOUNTER — Ambulatory Visit (INDEPENDENT_AMBULATORY_CARE_PROVIDER_SITE_OTHER): Payer: Commercial Managed Care - HMO | Admitting: Physician Assistant

## 2016-10-16 ENCOUNTER — Encounter: Payer: Self-pay | Admitting: Physician Assistant

## 2016-10-16 ENCOUNTER — Other Ambulatory Visit: Payer: Self-pay | Admitting: Family Medicine

## 2016-10-16 VITALS — BP 190/98 | HR 70 | Temp 97.8°F | Resp 18 | Wt 155.0 lb

## 2016-10-16 DIAGNOSIS — B9689 Other specified bacterial agents as the cause of diseases classified elsewhere: Principal | ICD-10-CM

## 2016-10-16 DIAGNOSIS — J988 Other specified respiratory disorders: Secondary | ICD-10-CM | POA: Diagnosis not present

## 2016-10-16 MED ORDER — AZITHROMYCIN 250 MG PO TABS: ORAL_TABLET | ORAL | 0 refills | Status: DC

## 2016-10-16 NOTE — Progress Notes (Signed)
Patient ID: Christine Hogan MRN: [id number], DOB: Apr 15, 1946, 70 y.o. Date of Encounter: 10/16/2016, 12:03 PM    Chief Complaint:  Chief Complaint  Patient presents with  . Cough     HPI: 70 y.o. year old female since with above. Says that she has had this cough for 2 weeks. Says that she has had a little bit of congestion and her head and nose but mostly this chest congestion and cough. She never smoked. She has had no fevers or chills.     Home Meds:   Outpatient Medications Prior to Visit  Medication Sig Dispense Refill  . aspirin 81 MG tablet Take 81 mg by mouth daily.    Marland Kitchen. atorvastatin (LIPITOR) 80 MG tablet Take 1 tablet (80 mg total) by mouth daily. 30 tablet 6  . cloNIDine (CATAPRES) 0.1 MG tablet Take 1 tablet (0.1 mg total) by mouth 2 (two) times daily. 180 tablet 1  . ezetimibe (ZETIA) 10 MG tablet Take 1 tablet (10 mg total) by mouth daily. 30 tablet 5  . fish oil-omega-3 fatty acids 1000 MG capsule Take 2 capsules (2 g total) by mouth daily. 60 capsule 11  . hydrALAZINE (APRESOLINE) 25 MG tablet Take 1 tablet (25 mg total) by mouth 3 (three) times daily. 90 tablet 1  . hydrALAZINE (APRESOLINE) 50 MG tablet Take 1 tablet (50 mg total) by mouth 3 (three) times daily. 90 tablet 5  . hydrochlorothiazide (HYDRODIURIL) 25 MG tablet Take 1 tablet (25 mg total) by mouth daily. 90 tablet 1  . nitroGLYCERIN (NITROSTAT) 0.4 MG SL tablet Place 1 tablet (0.4 mg total) under the tongue every 5 (five) minutes as needed for chest pain. 20 tablet 0  . olmesartan (BENICAR) 40 MG tablet Take 1 tablet (40 mg total) by mouth daily. 30 tablet 5  . amLODipine (NORVASC) 10 MG tablet Take 1 tablet (10 mg total) by mouth daily. (Patient not taking: Reported on 10/16/2016) 90 tablet 1   No facility-administered medications prior to visit.     Allergies:  Allergies  Allergen Reactions  . Codeine Nausea And Vomiting    dizziness      Review of Systems: See HPI for pertinent ROS. All other  ROS negative.    Physical Exam: Blood pressure (!) 190/98, pulse 70, temperature 97.8 F (36.6 C), temperature source Oral, resp. rate 18, weight 155 lb (70.3 kg), SpO2 98 %., Body mass index is 25.4 kg/m. General:  WNWD AAF. Appears in no acute distress. HEENT: Normocephalic, atraumatic, eyes without discharge, sclera non-icteric, nares are without discharge. Bilateral auditory canals clear, TM's are without perforation, pearly grey and translucent with reflective cone of light bilaterally. Oral cavity moist, posterior pharynx without exudate, erythema, peritonsillar abscess.  Neck: Supple. No thyromegaly. No lymphadenopathy. Lungs: Clear bilaterally to auscultation without wheezes, rales, or rhonchi. Breathing is unlabored. Heart: Regular rhythm. No murmurs, rubs, or gallops. Msk:  Strength and tone normal for age. Extremities/Skin: Warm and dry. Neuro: Alert and oriented X 3. Moves all extremities spontaneously. Gait is normal. CNII-XII grossly in tact. Psych:  Responds to questions appropriately with a normal affect.     ASSESSMENT AND PLAN:  70 y.o. year old female with  1. Bacterial respiratory infection She is to take antibiotic as directed. Follow-up if symptoms do not resolve within 1 week after completion of antibiotic. - azithromycin (ZITHROMAX) 250 MG tablet; Day 1: Take 2 daily. Days 2-5: Take 1 daily.  Dispense: 6 tablet; Refill: 0  Uncontrolled hypertension  Patient states that she does not take every single medicine every single day. Says that Dr. Tanya NonesPickard has gotten on her for this in the past. She says that she is aware that this is very serious and can cause serious problems to her health. She says that she has no good excuse as to why she doesn't take her medicines as she is supposed to. I have told her again today the need to take her medicines every single day as directed. She voices understanding and agrees.  [address]igned, Khameron Gruenwald Beth LaconaDixon, [place]PA, [facility] 10/16/2016 12:03  PM

## 2016-10-23 ENCOUNTER — Telehealth: Payer: Self-pay | Admitting: Family Medicine

## 2016-10-23 MED ORDER — LEVOFLOXACIN 750 MG PO TABS: 750.0000 mg | ORAL_TABLET | Freq: Every day | ORAL | 0 refills | Status: DC

## 2016-10-23 NOTE — Telephone Encounter (Signed)
I called patient. She says that compared to at her visit-- that she is not any worse but she is not any better. Thinks that she has had no change. Just has the cough. No congestion in her head or nose. Told her that we would send in another antibiotic. If cough does not resolve after this antibiotic, then follow-up again. --Levaquin 750mg  1 po QD x 7 days # 7 + 0.

## 2016-10-23 NOTE — Telephone Encounter (Signed)
Patient called in states that she is still coughing and is [facility] [address]  CB# [phone]

## 2016-10-23 NOTE — Telephone Encounter (Signed)
Pt states she seem to be getting better just not able to get rid of cough and is req a RX be called in. Pt states she does not have a car and it is hard for her to get a ride to the Dr office.  Pls advise pt was seen in office 12-11

## 2016-10-23 NOTE — Telephone Encounter (Signed)
rx sent to pharmacy

## 2016-11-02 ENCOUNTER — Ambulatory Visit (INDEPENDENT_AMBULATORY_CARE_PROVIDER_SITE_OTHER): Payer: Commercial Managed Care - HMO | Admitting: Family Medicine

## 2016-11-02 ENCOUNTER — Encounter: Payer: Self-pay | Admitting: Family Medicine

## 2016-11-02 VITALS — BP 134/82 | HR 68 | Temp 98.2°F | Resp 14 | Ht 65.5 in | Wt 159.0 lb

## 2016-11-02 DIAGNOSIS — M25561 Pain in right knee: Secondary | ICD-10-CM

## 2016-11-02 DIAGNOSIS — R55 Syncope and collapse: Secondary | ICD-10-CM | POA: Diagnosis not present

## 2016-11-03 ENCOUNTER — Ambulatory Visit
Admission: RE | Admit: 2016-11-03 | Discharge: 2016-11-03 | Disposition: A | Discharge status: Home / Self Care | Payer: Commercial Managed Care - HMO | Source: Ambulatory Visit | Attending: Family Medicine | Admitting: Family Medicine

## 2016-11-03 ENCOUNTER — Other Ambulatory Visit: Payer: Self-pay | Admitting: Family Medicine

## 2016-11-03 DIAGNOSIS — R55 Syncope and collapse: Secondary | ICD-10-CM

## 2016-11-03 DIAGNOSIS — M25561 Pain in right knee: Secondary | ICD-10-CM

## 2016-11-03 DIAGNOSIS — M7989 Other specified soft tissue disorders: Secondary | ICD-10-CM | POA: Diagnosis not present

## 2016-11-03 LAB — COMPLETE METABOLIC PANEL WITH GFR
ALT: 15 U/L (ref 6–29)
AST: 18 U/L (ref 10–35)
Albumin: 4.1 g/dL (ref 3.6–5.1)
Alkaline Phosphatase: 59 U/L (ref 33–130)
BUN: 16 mg/dL (ref 7–25)
CO2: 28 mmol/L (ref 20–31)
Calcium: 9.4 mg/dL (ref 8.6–10.4)
Chloride: 104 mmol/L (ref 98–110)
Creat: 1.37 mg/dL — ABNORMAL HIGH (ref 0.50–0.99)
GFR, Est African American: 46 mL/min — ABNORMAL LOW (ref >=60)
GFR, Est Non African American: 40 mL/min — ABNORMAL LOW (ref >=60)
Glucose, Bld: 93 mg/dL (ref 70–99)
Potassium: 3.6 mmol/L (ref 3.5–5.3)
Sodium: 140 mmol/L (ref 135–146)
Total Bilirubin: 0.6 mg/dL (ref 0.2–1.2)
Total Protein: 6.7 g/dL (ref 6.1–8.1)

## 2016-11-03 LAB — CBC WITH DIFFERENTIAL/PLATELET
Basophils Absolute: 0 cells/uL (ref 0–200)
Basophils Relative: 0 %
Eosinophils Absolute: 58 cells/uL (ref 15–500)
Eosinophils Relative: 1 %
HCT: 37.1 % (ref 35.0–45.0)
Hemoglobin: 11.9 g/dL — ABNORMAL LOW (ref 12.0–15.0)
Lymphocytes Relative: 43 %
Lymphs Abs: 2494 cells/uL (ref 850–3900)
MCH: 28.5 pg (ref 27.0–33.0)
MCHC: 32.1 g/dL (ref 32.0–36.0)
MCV: 89 fL (ref 80.0–100.0)
MPV: 10.1 fL (ref 7.5–12.5)
Monocytes Absolute: 638 cells/uL (ref 200–950)
Monocytes Relative: 11 %
Neutro Abs: 2610 cells/uL (ref 1500–7800)
Neutrophils Relative %: 45 %
Platelets: 219 10*3/uL (ref 140–400)
RBC: 4.17 MIL/uL (ref 3.80–5.10)
RDW: 14 % (ref 11.0–15.0)
WBC: 5.8 10*3/uL (ref 3.8–10.8)

## 2016-11-03 NOTE — Progress Notes (Signed)
Subjective:    Patient ID: Christine Hogan, female    DOB: 1946/04/17, 70 y.o.   MRN: [id number]  HPI Patient is a 70 year old African-American female with a history of poorly controlled hypertension, hyperlipidemia who presents with right knee pain. She was seen in March of this year and received a cortisone injection in her right knee for right knee pain presenting to be secondary to osteoarthritis. Patient was pain free for almost 9 months. Recently the pain has returned. However now the pain is more posterior. She reports stiffness in her knee in the morning. Her knee locks at times and feels "in a bind". She also has a positive Apley grind and a positive McMurray test. She denies any tenderness to palpation over the joint lines. Past Medical History:  Diagnosis Date  . Arthritis   . Chest pain   . Hyperlipidemia   . Hypertension    Past Surgical History:  Procedure Laterality Date  . ABDOMINAL HYSTERECTOMY     TAH/BSO (menorrhagia)  . bunion repair    . HERNIA REPAIR     Current Outpatient Prescriptions on File Prior to Visit  Medication Sig Dispense Refill  . amLODipine (NORVASC) 10 MG tablet Take 1 tablet (10 mg total) by mouth daily. 90 tablet 1  . aspirin 81 MG tablet Take 81 mg by mouth daily.    Marland Kitchen. atorvastatin (LIPITOR) 80 MG tablet Take 1 tablet (80 mg total) by mouth daily. 30 tablet 6  . cloNIDine (CATAPRES) 0.1 MG tablet TAKE ONE TABLET BY MOUTH TWICE DAILY 180 tablet 1  . ezetimibe (ZETIA) 10 MG tablet Take 1 tablet (10 mg total) by mouth daily. 30 tablet 5  . fish oil-omega-3 fatty acids 1000 MG capsule Take 2 capsules (2 g total) by mouth daily. 60 capsule 11  . hydrochlorothiazide (HYDRODIURIL) 25 MG tablet TAKE ONE TABLET BY MOUTH ONCE DAILY 90 tablet 1  . nitroGLYCERIN (NITROSTAT) 0.4 MG SL tablet Place 1 tablet (0.4 mg total) under the tongue every 5 (five) minutes as needed for chest pain. 20 tablet 0  . olmesartan (BENICAR) 40 MG tablet Take 1 tablet (40 mg total)  by mouth daily. 30 tablet 5  . hydrALAZINE (APRESOLINE) 25 MG tablet Take 1 tablet (25 mg total) by mouth 3 (three) times daily. (Patient not taking: Reported on 11/02/2016) 90 tablet 1  . hydrALAZINE (APRESOLINE) 50 MG tablet Take 1 tablet (50 mg total) by mouth 3 (three) times daily. (Patient not taking: Reported on 11/02/2016) 90 tablet 5   No current facility-administered medications on file prior to visit.    Allergies  Allergen Reactions  . Codeine Nausea And Vomiting    dizziness   Social History   Social History  . Marital status: Divorced    Spouse name: N/A  . Number of children: N/A  . Years of education: N/A   Occupational History  . Not on file.   Social History Main Topics  . Smoking status: Never Smoker  . Smokeless tobacco: Never Used  . Alcohol use 0.5 - 1.0 oz/week    1 - 2 drink(s) per week  . Drug use: No  . Sexual activity: Yes   Other Topics Concern  . Not on file   Social History Narrative  . No narrative on file      Review of Systems  All other systems reviewed and are negative.      Objective:   Physical Exam  Constitutional: She appears well-developed and well-nourished.  Cardiovascular: Normal rate, regular rhythm and normal heart sounds.   Pulmonary/Chest: Effort normal and breath sounds normal. No respiratory distress. She has no wheezes. She has no rales.  Musculoskeletal:       Right knee: She exhibits abnormal meniscus. She exhibits normal range of motion, no swelling, no effusion, no LCL laxity and no MCL laxity. No medial joint line, no lateral joint line, no MCL and no LCL tenderness noted.  Vitals reviewed.         Assessment & Plan:  Right knee pain, unspecified chronicity - Plan: DG Knee Complete 4 Views Right  Near syncope - Plan: CBC with Differential/Platelet, COMPLETE METABOLIC PANEL WITH GFR, Ambulatory referral to Cardiology  I believe her right knee pain may be due to a combination of osteoarthritis and  possibly a meniscal tear. Proceed with an x-ray of the knee and possibly an MRI depending upon x-ray results. After 20 minute encounter discussing plan of care, etc. patient mentions another problem. She states that one week ago she was shopping. She states she became very lightheaded. She states that she felt like her heart begin to race. She became diaphoretic. She almost passed out. Symptoms lasted 10 minutes and resolved spontaneously. She denies any chest pain during the encounter. She denies any shortness of breath or orthopnea or paroxysmal my internal dyspnea. Therefore I recommend a cardiology consultation for an event monitor to evaluate for cardiac arrhythmia as well as an echocardiogram to further workup near syncope.

## 2016-11-09 ENCOUNTER — Ambulatory Visit (INDEPENDENT_AMBULATORY_CARE_PROVIDER_SITE_OTHER): Payer: Commercial Managed Care - HMO | Admitting: Family Medicine

## 2016-11-09 VITALS — BP 150/98 | HR 64 | Temp 98.2°F | Resp 14 | Ht 65.5 in | Wt 159.0 lb

## 2016-11-09 DIAGNOSIS — I10 Essential (primary) hypertension: Secondary | ICD-10-CM | POA: Diagnosis not present

## 2016-11-09 DIAGNOSIS — M25561 Pain in right knee: Secondary | ICD-10-CM

## 2016-11-09 MED ORDER — HYDRALAZINE HCL 50 MG PO TABS: 50.0000 mg | ORAL_TABLET | Freq: Three times a day (TID) | ORAL | 5 refills | Status: DC

## 2016-11-09 NOTE — Progress Notes (Signed)
Subjective:    Patient ID: Christine Hogan, female    DOB: [birth year], 71 y.o.   MRN: [id number]  HPI Patient is a 71 year old African-American female with a history of poorly controlled hypertension, hyperlipidemia who presents with right knee pain. She was seen in March of this year and received a cortisone injection in her right knee for right knee pain presenting to be secondary to osteoarthritis. Patient was pain free for almost 9 months. Recently the pain has returned. However now the pain is more posterior. She reports stiffness in her knee in the morning. Her knee locks at times and feels "in a bind". She also has a positive Apley grind and a positive McMurray test. She denies any tenderness to palpation over the joint lines.  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 She is here today for cortisone injection in the knee for symptomatic relief Past Medical History:  Diagnosis Date  . Arthritis   . Chest pain   . Hyperlipidemia   . Hypertension    Past Surgical History:  Procedure Laterality Date  . ABDOMINAL HYSTERECTOMY     TAH/BSO (menorrhagia)  . bunion repair    . HERNIA REPAIR     Current Outpatient Prescriptions on File Prior to Visit  Medication Sig Dispense Refill  . amLODipine (NORVASC) 10 MG tablet Take 1 tablet (10 mg total) by mouth daily. 90 tablet 1  . aspirin 81 MG tablet Take 81 mg by mouth daily.    Marland Kitchen atorvastatin (LIPITOR) 80 MG tablet Take 1 tablet (80 mg total) by mouth daily. 30 tablet 6  . cloNIDine (CATAPRES) 0.1 MG tablet TAKE ONE TABLET BY MOUTH TWICE DAILY 180 tablet 1  . ezetimibe (ZETIA) 10 MG tablet Take 1 tablet (10 mg total) by mouth daily. 30 tablet 5  . fish oil-omega-3 fatty acids 1000 MG capsule Take 2 capsules (2 g total) by mouth daily. 60 capsule 11  . hydrochlorothiazide (HYDRODIURIL) 25 MG tablet TAKE ONE TABLET BY MOUTH ONCE DAILY 90 tablet 1  . nitroGLYCERIN (NITROSTAT) 0.4  MG SL tablet Place 1 tablet (0.4 mg total) under the tongue every 5 (five) minutes as needed for chest pain. 20 tablet 0  . olmesartan (BENICAR) 40 MG tablet Take 1 tablet (40 mg total) by mouth daily. 30 tablet 5   No current facility-administered medications on file prior to visit.    Allergies  Allergen Reactions  . Codeine Nausea And Vomiting    dizziness   Social History   Social History  . Marital status: Divorced    Spouse name: N/A  . Number of children: N/A  . Years of education: N/A   Occupational History  . Not on file.   Social History Main Topics  . Smoking status: Never Smoker  . Smokeless tobacco: Never Used  . Alcohol use 0.5 - 1.0 oz/week    1 - 2 drink(s) per week  . Drug use: No  . Sexual activity: Yes   Other Topics Concern  . Not on file   Social History Narrative  . No narrative on file      Review of Systems  All other systems reviewed and are negative.      Objective:   Physical Exam  Constitutional: She appears well-developed and well-nourished.  Cardiovascular: Normal rate, regular rhythm and normal heart sounds.   Pulmonary/Chest: Effort normal and breath sounds normal. No respiratory distress. She has no wheezes. She has no rales.  Musculoskeletal:       Right knee: She exhibits abnormal meniscus. She exhibits normal range of motion, no swelling, no effusion, no LCL laxity and no MCL laxity. No medial joint line, no lateral joint line, no MCL and no LCL tenderness noted.  Vitals reviewed.         Assessment & Plan:  Right knee pain, unspecified chronicity  Benign essential HTN - Plan: hydrALAZINE (APRESOLINE) 50 MG tablet  I believe her right knee pain may be due to a combination of osteoarthritis and possibly a meniscal tear. Using sterile technique, I injected the right knee with 1 mL of lidocaine, 2 mL of Marcaine, and 2 mL of 40 mg per mL Kenalog. The patient tolerated the procedure well without complication. Should the  pain not improved with cortisone injection, I would recommend orthopedics consultation. She is scheduled to see the cardiologist next week to discuss her near syncopal episode

## 2016-11-12 NOTE — Progress Notes (Signed)
Cardiology Office Note    Date:  11/13/2016   ID:  Christine KohJoan C Stukey, DOB 1945-12-24, MRN [id number]  PCP:  Leo GrosserPICKARD,WARREN TOM, MD  Cardiologist: Previously seen by Dr. Tresa EndoKelly in 2014 - Wishes to follow with Dr. Royann Shiversroitoru (father was a patient of his)  Chief Complaint  Patient presents with  . New Patient (Initial Visit)    Syncope    History of Present Illness:    Christine Hogan is a 71 y.o. female with past medical history of HTN and HLD who presents to the office today as a new patient referral for evaluation of syncope  She was last seen by Dr. Tresa EndoKelly in 2014 for chest pain. Her symptoms appeared to be most consistent with a MSK etiology but her EKG showed inferolateral TWI, therefore an echocardiogram and NST were obtained for further evaluation. Echo showed a preserved EF of 65-70% with Grade 1 DD, moderate TR, and mild to moderate MR. NST was without any evidence of ischemia.    The patient reports she was at the beach on vacation in 10/2016 when she experienced an episode of syncope. She had been in her usual state of health until the evening hours when [place] was [place] and all of a sudden began to experience dizziness and diaphoresis. Reports it felt like she was having a "hot flash" at that time. Her daughter escorted her to the car, and upon sitting in the passenger seat she reports she lost consciousness for approximately 5 minutes. Upon waking, she was back to her usual state of health.  She denies any associated chest discomfort, palpitations, or shortness of breath at that time. Denies any repeat episodes since. Reports she was well hydrated during her vacation and had not experienced any recent illnesses. Reports having a syncopal episode as a child but denies any repeat episodes prior to her recent event.   She denies any prior cardiac history. No known MI's or cardiac arrhythmias. Her HTN and HLD are followed by her PCP. No tobacco use. Does consume 5-6 mixed drinks  per week. Says she did not consume alcohol on the day of her event but had the day prior.    Past Medical History:  Diagnosis Date  . Arthritis   . Chest pain    a. 2014: NST with no evidence of ischemia  . H/O echocardiogram    a. 2014: echo showing EF of 65-70% with Grade 1 DD, moderate TR, and mild to moderate MR  . Hyperlipidemia   . Hypertension     Past Surgical History:  Procedure Laterality Date  . ABDOMINAL HYSTERECTOMY     TAH/BSO (menorrhagia)  . bunion repair    . HERNIA REPAIR      Current Medications: Outpatient Medications Prior to Visit  Medication Sig Dispense Refill  . amLODipine (NORVASC) 10 MG tablet Take 1 tablet (10 mg total) by mouth daily. 90 tablet 1  . aspirin 81 MG tablet Take 81 mg by mouth daily.    . cloNIDine (CATAPRES) 0.1 MG tablet TAKE ONE TABLET BY MOUTH TWICE DAILY 180 tablet 1  . ezetimibe (ZETIA) 10 MG tablet Take 1 tablet (10 mg total) by mouth daily. 30 tablet 5  . fish oil-omega-3 fatty acids 1000 MG capsule Take 2 capsules (2 g total) by mouth daily. 60 capsule 11  . hydrALAZINE (APRESOLINE) 50 MG tablet Take 1 tablet (50 mg total) by mouth 3 (three) times daily. 90 tablet 5  . hydrochlorothiazide (HYDRODIURIL) 25  MG tablet TAKE ONE TABLET BY MOUTH ONCE DAILY 90 tablet 1  . nitroGLYCERIN (NITROSTAT) 0.4 MG SL tablet Place 1 tablet (0.4 mg total) under the tongue every 5 (five) minutes as needed for chest pain. 20 tablet 0  . atorvastatin (LIPITOR) 80 MG tablet Take 1 tablet (80 mg total) by mouth daily. 30 tablet 6  . olmesartan (BENICAR) 40 MG tablet Take 1 tablet (40 mg total) by mouth daily. 30 tablet 5   No facility-administered medications prior to visit.      Allergies:   Codeine   Social History   Social History  . Marital status: Divorced    Spouse name: N/A  . Number of children: N/A  . Years of education: N/A   Social History Main Topics  . Smoking status: Never Smoker  . Smokeless tobacco: Never Used  . Alcohol  use 3.0 - 3.6 oz/week    5 - 6 Standard drinks or equivalent per week  . Drug use: No  . Sexual activity: Yes   Other Topics Concern  . None   Social History Narrative  . None     Family History:  The patient's family history includes Diabetes in her sister and sister; Heart disease in her father; Hypertension in her daughter, daughter, father, sister, and sister.   Review of Systems:   Please see the history of present illness.     General:  No chills, fever, night sweats or weight changes. Positive for diaphoresis.  Cardiovascular:  No chest pain, dyspnea on exertion, edema, orthopnea, palpitations, paroxysmal nocturnal dyspnea. Dermatological: No rash, lesions/masses Respiratory: No cough, dyspnea Urologic: No hematuria, dysuria Abdominal:   No nausea, vomiting, diarrhea, bright red blood per rectum, melena, or hematemesis Neurologic:  No visual changes, wkns, changes in mental status. Positive for syncope and dizziness.  All other systems reviewed and are otherwise negative except as noted above.   Physical Exam:    VS:  BP (!) 157/87 (BP Location: Left Arm)   Pulse 84   Ht 5' 5.5" (1.664 m)   Wt 160 lb (72.6 kg)   BMI 26.22 kg/m    General: Well developed, well nourished [place] female appearing in no acute distress. Head: Normocephalic, atraumatic, sclera non-icteric, no xanthomas, nares are without discharge.  Neck: No carotid bruits. JVD not elevated.  Lungs: Respirations regular and unlabored, without wheezes or rales.  Heart: Regular rate and rhythm. No S3 or S4.  No murmur, no rubs, or gallops appreciated. Abdomen: Soft, non-tender, non-distended with normoactive bowel sounds. No hepatomegaly. No rebound/guarding. No obvious abdominal masses. Msk:  Strength and tone appear normal for age. No joint deformities or effusions. Extremities: No clubbing or cyanosis. No edema.  Distal pedal pulses are 2+ bilaterally. Neuro: Alert and oriented X 3. Moves all  extremities spontaneously. No focal deficits noted. Psych:  Responds to questions appropriately with a normal affect. Skin: No rashes or lesions noted  Wt Readings from Last 3 Encounters:  11/13/16 160 lb (72.6 kg)  11/09/16 159 lb (72.1 kg)  11/02/16 159 lb (72.1 kg)     Studies/Labs Reviewed:   EKG:  EKG is ordered today. The ekg ordered today demonstrates NSR, HR 81, occasional PAC's, and septal Q-waves (similar to prior tracings).   Recent Labs: 11/02/2016: ALT 15; BUN 16; Creat 1.37; Hemoglobin 11.9; Platelets 219; Potassium 3.6; Sodium 140   Lipid Panel    Component Value Date/Time   CHOL 209 (H) 05/07/2014 0852   CHOL 191 07/10/2013 1191  TRIG 252 (H) 05/07/2014 0852   TRIG 210 (H) 07/10/2013 0832   HDL 39 (L) 05/07/2014 0852   HDL 52 07/10/2013 0832   CHOLHDL 5.4 05/07/2014 0852   VLDL 50 (H) 05/07/2014 0852   LDLCALC 120 (H) 05/07/2014 0852   LDLCALC 97 07/10/2013 0832    Additional studies/ records that were reviewed today include:   Echocardiogram: 07/2013 Study Conclusions  - Left ventricle: The cavity size was normal. Wall thickness was increased in a pattern of mild LVH. There was moderate focal basal hypertrophy of the septum - consider hypertrophic cardiomyopathy. Systolic function was vigorous. The estimated ejection fraction was in the range of 65% to 70%. Doppler parameters are consistent with abnormal left ventricular relaxation (grade 1 diastolic dysfunction). The E/e' ratio is <10, suggesting normal LV filling pressure. - Aortic valve: There was no stenosis. No regurgitation. - Mitral valve: Moderately calcified annulus, mostly posterior. Mild to moderate regurgitation. - Left atrium: LA volume/ BSA = 38.9 ml/m2 - Tricuspid valve: Moderate regurgitation. - Pulmonary arteries: PA peak pressure: 39mm Hg (S). - Inferior vena cava: The vessel was normal in size; the respirophasic diameter changes were in the normal range  (= 50%); findings are consistent with normal central venous pressure.  NST: 07/2013  Impression Exercise Capacity:  Lexiscan with no exercise. BP Response:  Normal blood pressure response. Clinical Symptoms:  There is dyspnea. ECG Impression:  No significant ECG changes with Lexiscan. Comparison with Prior Nuclear Study: No previous nuclear study performed  Overall Impression:  Normal stress nuclear study.  LV Wall Motion:  NL LV Function; NL Wall Motion; EF 79%  Assessment:    1. Syncope, unspecified syncope type   2. Essential hypertension   3. Mixed hyperlipidemia     Plan:   In order of problems listed above:  1. Syncope - reports developing dizziness and diaphoresis while on vacation in 10/2016. She sat down and says she lost consciousness for a few minutes as her daughter noted she was not replying to her name being called. The entire episode lasted less than 10 minutes. No associated chest discomfort, palpitations, or shortness of breath at that time. Denies any repeat episodes since.  - no recent illness at the time of her trip. Denies consuming alcohol on the day of the episode but does note she consumes 5-6 mixed drinks in one sitting occasionally (encougared not to do this) and had consumed alcohol the day prior. Unsure how much fluid she consumed the day of the event. - recent CBC and BMET reviewed in Epic which do not show any acute abnormalities.  - EKG today is without acute changes when compared to prior tracings. Will obtain a 30-day event monitor to examine for any pathologic cardiac arrhythmias as the etiology of her syncope although this was possibly secondary to dehydration in the setting of recent alcohol use. Discussed with Dr. [place] (DOD) who was in agreement with this assessment and plan.   2. HTN - BP at 157/87 today. Patient reports medications were recently adjusted by her PCP. - continue Amlodipine, Clonidine, HCTZ, and Hydralazine. Benicar on  list but patient reports not taking this as it was stopped by her PCP. I asked her to verify that with Dr. Tanya Nones as it was listed on her last office visit with his office as well.   3. HLD - followed by PCP. Continue Atorvastatin and Zetia.    Medication Adjustments/Labs and Tests Ordered: Current medicines are reviewed at length with the patient today.  Concerns regarding medicines are outlined above.  Medication changes, Labs and Tests ordered today are listed in the Patient Instructions below. Patient Instructions  Medication Instructions:  Your physician recommends that you continue on your current medications as directed. Please refer to the Current Medication list given to you today.   Labwork: none  Testing/Procedures: Your physician has recommended that you wear an event monitor. Event monitors are medical devices that record the heart's electrical activity. Doctors most often [place] these monitors to diagnose arrhythmias. Arrhythmias are problems with the speed or rhythm of the heartbeat. The monitor is a small, portable device. You can wear one while you do your normal daily activities. This is usually used to diagnose what is causing palpitations/syncope (passing out). This appointment will be at our [facility] Office--[address] [facility] [place].   Follow-Up: Your physician recommends that you schedule a follow-up appointment as needed with Dr. Royann Shivers.   Any Other Special Instructions Will Be Listed Below (If Applicable).  If you need a refill on your cardiac medications before your next appointment, please call your pharmacy.   Lorri Frederick, [place]  11/13/2016 3:56 PM    [facility] [address] [place], [address] [place], [place]  [address] Phone: [phone]; Fax: [phone]  [address] [place], [place] [address] Phone: [phone]

## 2016-11-13 ENCOUNTER — Encounter: Payer: Self-pay | Admitting: Student

## 2016-11-13 ENCOUNTER — Ambulatory Visit (INDEPENDENT_AMBULATORY_CARE_PROVIDER_SITE_OTHER): Payer: Medicare HMO | Admitting: Student

## 2016-11-13 VITALS — BP 157/87 | HR 84 | Ht 65.5 in | Wt 160.0 lb

## 2016-11-13 DIAGNOSIS — I10 Essential (primary) hypertension: Secondary | ICD-10-CM | POA: Diagnosis not present

## 2016-11-13 DIAGNOSIS — R55 Syncope and collapse: Secondary | ICD-10-CM

## 2016-11-13 DIAGNOSIS — E782 Mixed hyperlipidemia: Secondary | ICD-10-CM

## 2016-11-13 NOTE — Patient Instructions (Addendum)
Medication Instructions:  Your physician recommends that you continue on your current medications as directed. Please refer to the Current Medication list given to you today.   Labwork: none  Testing/Procedures: Your physician has recommended that you wear an event monitor. Event monitors are medical devices that record the heart's electrical activity. Doctors most often us these monitors to diagnose arrhythmias. Arrhythmias are problems with the speed or rhythm of the heartbeat. The monitor is a small, portable device. You can wear one while you do your normal daily activities. This is usually used to diagnose what is causing palpitations/syncope (passing out). This appointment will be at our [facility] Office--[address] [address] [address] [place].     Follow-Up: Your physician recommends that you schedule a follow-up appointment as needed with Dr. Royann Shiversroitoru.     Any Other Special Instructions Will Be Listed Below (If Applicable).     If you need a refill on your cardiac medications before your next appointment, please call your pharmacy.

## 2016-11-16 ENCOUNTER — Ambulatory Visit (INDEPENDENT_AMBULATORY_CARE_PROVIDER_SITE_OTHER): Payer: Medicare HMO

## 2016-11-16 DIAGNOSIS — R55 Syncope and collapse: Secondary | ICD-10-CM | POA: Diagnosis not present

## 2016-12-05 ENCOUNTER — Telehealth: Payer: Self-pay | Admitting: *Deleted

## 2016-12-05 NOTE — Telephone Encounter (Signed)
Received telephone call and fax related to the patients monitor. They report atrial fib, strips reviewed by brittney strader and dr berry and atrial fib confirmed. Left message for pt to call to schedule follow up appointment.

## 2016-12-06 NOTE — Telephone Encounter (Signed)
New onset a fib on a [facility] cardiac report -- reviewed by Dr. Allyson SabalBerry yesterday. I have called patient and left msg for her to call.

## 2016-12-06 NOTE — Telephone Encounter (Signed)
Was able to reach patient, discussed findings and recommendations. She's agreeable to appt this week, scheduled to see [place] at 2:30pm Friday for f/u on monitor.

## 2016-12-06 NOTE — Telephone Encounter (Signed)
Of note, patient denied any symptoms or concerns . She has been instructed to continue monitor. Will follow up for further advice at appt.

## 2016-12-07 NOTE — Progress Notes (Signed)
Cardiology Office Note    Date:  12/08/2016   ID:  Christine Hogan, DOB 10/29/1946, MRN [id number]  PCP:  Leo Grosser, MD  Cardiologist: Dr. Royann Shivers   Chief Complaint  Patient presents with  . Follow-up    Event Monitor Findings    History of Present Illness:    Christine Hogan is a 71 y.o. female with past medical history of HTN, HLD, and normal NST in 2014 who presents to the office today for follow-up.   Was last seen by myself on 11/13/2016 for evaluation of syncope which occurred the month prior. Denied any associated chest discomfort, palpitations, or dyspnea. Recent CBC and BMET were without acute abnormalities and a cardiac event monitor was recommended. This was placed in 11/16/2016, and on 12/05/2016 the office was contacted in regards to the monitor showing episodes of atrial fibrillation. These strips were reviewed by myself and Dr. Allyson Sabal and thought to be consistent with atrial fibrillation.   In talking with the patient today, she denies any recent symptoms on the day of 12/05/2016 when atrial fibrillation was noted on her heart monitor. Heart rate was well-controlled at that time. She reports shopping with family and was unaware she was in the rhythm.  Denies any recent chest discomfort, palpitations, or dyspnea with exertion.  Does not check BP regularly at home. Reports only taking Clonidine in the evening due to feeling fatigued with AM dosing.   When reviewing possible triggers of atrial fibrillation, she does disclose consuming 4-5 alcoholic beverages some days per week. Reports having done this for the past several months due to the loss of close family members. No known history of thyroid disease. Believes this was recently checked by her PCP.    Past Medical History:  Diagnosis Date  . Arthritis   . Chest pain    a. 2014: NST with no evidence of ischemia  . H/O echocardiogram    a. 2014: echo showing EF of 65-70% with Grade 1 DD, moderate TR, and mild to  moderate MR  . Hyperlipidemia   . Hypertension     Past Surgical History:  Procedure Laterality Date  . ABDOMINAL HYSTERECTOMY     TAH/BSO (menorrhagia)  . bunion repair    . HERNIA REPAIR      Current Medications: Outpatient Medications Prior to Visit  Medication Sig Dispense Refill  . amLODipine (NORVASC) 10 MG tablet Take 1 tablet (10 mg total) by mouth daily. 90 tablet 1  . ezetimibe (ZETIA) 10 MG tablet Take 1 tablet (10 mg total) by mouth daily. 30 tablet 5  . fish oil-omega-3 fatty acids 1000 MG capsule Take 2 capsules (2 g total) by mouth daily. 60 capsule 11  . hydrALAZINE (APRESOLINE) 50 MG tablet Take 1 tablet (50 mg total) by mouth 3 (three) times daily. 90 tablet 5  . hydrochlorothiazide (HYDRODIURIL) 25 MG tablet TAKE ONE TABLET BY MOUTH ONCE DAILY 90 tablet 1  . nitroGLYCERIN (NITROSTAT) 0.4 MG SL tablet Place 1 tablet (0.4 mg total) under the tongue every 5 (five) minutes as needed for chest pain. 20 tablet 0  . aspirin 81 MG tablet Take 81 mg by mouth daily.    . cloNIDine (CATAPRES) 0.1 MG tablet TAKE ONE TABLET BY MOUTH TWICE DAILY 180 tablet 1   No facility-administered medications prior to visit.      Allergies:   Codeine   Social History   Social History  . Marital status: Divorced    Spouse name: N/A  .  Number of children: N/A  . Years of education: N/A   Social History Main Topics  . Smoking status: Never Smoker  . Smokeless tobacco: Never Used  . Alcohol use 3.0 - 3.6 oz/week    5 - 6 Standard drinks or equivalent per week  . Drug use: No  . Sexual activity: Yes   Other Topics Concern  . None   Social History Narrative  . None     Family History:  The patient's family history includes Diabetes in her sister and sister; Heart disease in her father; Hypertension in her daughter, daughter, father, sister, and sister.   Review of Systems:   Please see the history of present illness.     General:  No chills, fever, night sweats or weight  changes. Positive for fatigue.  Cardiovascular:  No chest pain, dyspnea on exertion, edema, orthopnea, palpitations, paroxysmal nocturnal dyspnea. Dermatological: No rash, lesions/masses Respiratory: No cough, dyspnea Urologic: No hematuria, dysuria Abdominal:   No nausea, vomiting, diarrhea, bright red blood per rectum, melena, or hematemesis Neurologic:  No visual changes, wkns, changes in mental status. All other systems reviewed and are otherwise negative except as noted above.   Physical Exam:    VS:  BP (!) 171/90   Pulse 92   Ht 5' 5.5" (1.664 m)   Wt 159 lb 9.6 oz (72.4 kg)   BMI 26.15 kg/m    General: Well developed, well nourished [place] female appearing in no acute distress. Head: Normocephalic, atraumatic, sclera non-icteric, no xanthomas, nares are without discharge.  Neck: No carotid bruits. JVD not elevated.  Lungs: Respirations regular and unlabored, without wheezes or rales.  Heart: Regular rate and rhythm. No S3 or S4.  No murmur, no rubs, or gallops appreciated. Abdomen: Soft, non-tender, non-distended with normoactive bowel sounds. No hepatomegaly. No rebound/guarding. No obvious abdominal masses. Msk:  Strength and tone appear normal for age. No joint deformities or effusions. Extremities: No clubbing or cyanosis. No lower extremity edema.  Distal pedal pulses are 2+ bilaterally. Neuro: Alert and oriented X 3. Moves all extremities spontaneously. No focal deficits noted. Psych:  Responds to questions appropriately with a normal affect. Skin: No rashes or lesions noted  Wt Readings from Last 3 Encounters:  12/08/16 159 lb 9.6 oz (72.4 kg)  11/13/16 160 lb (72.6 kg)  11/09/16 159 lb (72.1 kg)     Studies/Labs Reviewed:   EKG:  EKG is ordered today.  The ekg ordered today demonstrates NSR, HR 92, with no acute ST or T-wave changes when compared to prior tracings.   Recent Labs: 11/02/2016: ALT 15; BUN 16; Creat 1.37; Hemoglobin 11.9; Platelets 219;  Potassium 3.6; Sodium 140   Lipid Panel    Component Value Date/Time   CHOL 209 (H) 05/07/2014 0852   CHOL 191 07/10/2013 0832   TRIG 252 (H) 05/07/2014 0852   TRIG 210 (H) 07/10/2013 0832   HDL 39 (L) 05/07/2014 0852   HDL 52 07/10/2013 0832   CHOLHDL 5.4 05/07/2014 0852   VLDL 50 (H) 05/07/2014 0852   LDLCALC 120 (H) 05/07/2014 0852   LDLCALC 97 07/10/2013 0832    Additional studies/ records that were reviewed today include:   Event Monitor Strips: 12/05/2016: Rate-controlled atrial fibrillation  NST: 07/2013 Impression Exercise Capacity:  Lexiscan with no exercise. BP Response:  Normal blood pressure response. Clinical Symptoms:  There is dyspnea. ECG Impression:  No significant ECG changes with Lexiscan. Comparison with Prior Nuclear Study: No previous nuclear study performed  Overall Impression:  Normal stress nuclear study.  LV Wall Motion:  NL LV Function; NL Wall Motion; EF 79%   Assessment:    1. Paroxysmal atrial fibrillation (HCC)   2. Essential hypertension   3. Mixed hyperlipidemia   4. Alcohol use     Plan:   In order of problems listed above:  1. Paroxysmal Atrial Fibrillation - recently seen in the office on 11/13/2016 for evaluation of syncope which occurred the month prior.  - event monitor placed which showed an episode of rate-controlled atrial fibrillation on 12/05/2016. These strips were reviewed by myself and Dr. Allyson SabalBerry (DOD) and thought to be consistent with atrial fibrillation. EKG today shows she is back in NSR.  - she denies any associated symptoms at that time. TSH recently checked by PCP and normal. She does report consuming 4-5 mixed drinks a few days per week which is a possible trigger of her arrhythmia. Gradual reduction and cessation of this was recommended as discussed below. We reviewed the risks and benefits of anticoagulation and I strongly recommended she decrease alcohol use with starting a blood thinner. No history of GIB or  hemorrhages. - This patients CHA2DS2-VASc Score and unadjusted Ischemic Stroke Rate (% per year) is equal to 3.2 % stroke rate/year from a score of 3 (HTN, Female, Age). Will start Eliquis 5mg  BID. 30-day card given along with Printed Rx.  - start Lopressor 25mg  BID for rate-control.   2. Essential HTN - BP elevated at 171/90 on initial check, 164/82 when rechecked. - Reports only taking Clonidine once daily due to increased fatigue with morning dosing. I'm concerned about rebound hypertension with her missing doses. - Will stop Clonidine and start Lopressor 25 mg twice a day in the setting of newly diagnosed PAF. Continue Amlodipine, Hydralazine, and HCTZ.   3. HLD - followed by PCP. Continue Zetia and Fish Oil.   4. Alcohol Use - reports consuming 4-5 alcoholic beverages some days per week, recently increasing her intake due to depression with the loss of recent family members. - recommended decreasing alcohol intake, for her general health and as well as a possible trigger for her atrial fibrillation and the associated risk of bleeding with alcohol use. Plans to cut back to less than 2 drinks per day and then try to reduce alcohol consumption altogether. Recommended she might benefit from talking with a counselor to discuss healthier ways to cope with her depression.     Medication Adjustments/Labs and Tests Ordered: Current medicines are reviewed at length with the patient today.  Concerns regarding medicines are outlined above.  Medication changes, Labs and Tests ordered today are listed in the Patient Instructions below. Patient Instructions  Medication Instructions:  STOP ASPIRIN STOP CLONIDINE  START TAKING ELIQUIS 5MG  (1 TABLET) TWO TIMES DAILY START LOPRESSOR 25MG  (1 TABLET) TWO TIMES DAILY  Labwork: NONE  Testing/Procedures: NONE  Follow-Up: Friday 4/6 AT 10:40 [facility]  Any Other Special Instructions Will Be Listed Below (If  Applicable).  If you need a refill on your cardiac medications before your next appointment, please call your pharmacy.  Signed, Ellsworth LennoxBrittany M Emerie Vanderkolk, [place]  12/08/2016 5:13 PM    [facility] [address] [place], [place]  [address] Phone: [phone]; Fax: [phone]  [address], [address] [place], [place] [address] Phone: [phone]

## 2016-12-08 ENCOUNTER — Ambulatory Visit (INDEPENDENT_AMBULATORY_CARE_PROVIDER_SITE_OTHER): Payer: Medicare HMO | Admitting: Student

## 2016-12-08 ENCOUNTER — Encounter: Payer: Self-pay | Admitting: Student

## 2016-12-08 ENCOUNTER — Encounter: Payer: Self-pay | Admitting: *Deleted

## 2016-12-08 VITALS — BP 171/90 | HR 92 | Ht 65.5 in | Wt 159.6 lb

## 2016-12-08 DIAGNOSIS — I480 Paroxysmal atrial fibrillation: Secondary | ICD-10-CM

## 2016-12-08 DIAGNOSIS — I10 Essential (primary) hypertension: Secondary | ICD-10-CM | POA: Diagnosis not present

## 2016-12-08 DIAGNOSIS — Z7289 Other problems related to lifestyle: Secondary | ICD-10-CM

## 2016-12-08 DIAGNOSIS — E782 Mixed hyperlipidemia: Secondary | ICD-10-CM | POA: Diagnosis not present

## 2016-12-08 DIAGNOSIS — Z789 Other specified health status: Secondary | ICD-10-CM | POA: Diagnosis not present

## 2016-12-08 MED ORDER — METOPROLOL TARTRATE 25 MG PO TABS: 25.0000 mg | ORAL_TABLET | Freq: Two times a day (BID) | ORAL | 3 refills | Status: DC

## 2016-12-08 MED ORDER — APIXABAN 5 MG PO TABS: 5.0000 mg | ORAL_TABLET | Freq: Two times a day (BID) | ORAL | 0 refills | Status: DC

## 2016-12-08 MED ORDER — APIXABAN 5 MG PO TABS: 5.0000 mg | ORAL_TABLET | Freq: Two times a day (BID) | ORAL | 6 refills | Status: DC

## 2016-12-08 NOTE — Patient Instructions (Signed)
Medication Instructions:  STOP ASPIRIN STOP CLONIDINE  START TAKING ELIQUIS 5MG  (1 TABLET) TWO TIMES DAILY START LOPRESSOR 25MG  (1 TABLET) TWO TIMES DAILY  Labwork: NONE  Testing/Procedures: NONE  Follow-Up: Friday 4/6 AT 10:40 with Dr[facility]  Any Other Special Instructions Will Be Listed Below (If Applicable).     If you need a refill on your cardiac medications before your next appointment, please call your pharmacy.

## 2017-02-08 ENCOUNTER — Other Ambulatory Visit: Payer: Self-pay | Admitting: Family Medicine

## 2017-02-09 ENCOUNTER — Encounter: Payer: Self-pay | Admitting: Cardiovascular Disease

## 2017-02-09 ENCOUNTER — Ambulatory Visit (INDEPENDENT_AMBULATORY_CARE_PROVIDER_SITE_OTHER): Payer: Medicare HMO | Admitting: Cardiovascular Disease

## 2017-02-09 ENCOUNTER — Other Ambulatory Visit: Payer: Self-pay

## 2017-02-09 VITALS — BP 141/86 | HR 84 | Ht 65.5 in | Wt 161.4 lb

## 2017-02-09 DIAGNOSIS — I480 Paroxysmal atrial fibrillation: Secondary | ICD-10-CM | POA: Diagnosis not present

## 2017-02-09 DIAGNOSIS — I340 Nonrheumatic mitral (valve) insufficiency: Secondary | ICD-10-CM

## 2017-02-09 DIAGNOSIS — Z7901 Long term (current) use of anticoagulants: Secondary | ICD-10-CM | POA: Diagnosis not present

## 2017-02-09 DIAGNOSIS — I10 Essential (primary) hypertension: Secondary | ICD-10-CM | POA: Diagnosis not present

## 2017-02-09 DIAGNOSIS — R55 Syncope and collapse: Secondary | ICD-10-CM

## 2017-02-09 MED ORDER — HYDRALAZINE HCL 50 MG PO TABS: 50.0000 mg | ORAL_TABLET | Freq: Three times a day (TID) | ORAL | 11 refills | Status: DC

## 2017-02-09 NOTE — Patient Instructions (Signed)
Dr Croitoru recommends that you schedule a follow-up appointment in 12 months. You will receive a reminder letter in the mail two months in advance. If you don't receive a letter, please call our office to schedule the follow-up appointment.  If you need a refill on your cardiac medications before your next appointment, please call your pharmacy. 

## 2017-02-09 NOTE — Telephone Encounter (Signed)
Medication refilled per protocol. 

## 2017-02-09 NOTE — Progress Notes (Signed)
Cardiology Office Note    Date:  02/09/2017   ID:  Christine Hogan, DOB 1946-04-15, MRN [id number]  PCP:  Leo Grosser, MD  Cardiologist:   Thurmon Fair, MD   Chief Complaint  Patient presents with  . Follow-up    pt has no complaints     History of Present Illness:  Christine Hogan is a 71 y.o. female whose father Aron Baba was my patient in the past (He passed away with intracranial hemorrhage last fall, age 6)..    Christine Hogan has a history of hypertension and hyperlipidemia, normal LVEF with grade 1 diastolic dysfunction, moderate to severely dilated left atrium, 2+ MR by previous echo 2014, normal stress test in 2014. She is here to follow-up for asymptomatic incidentally detected atrial fibrillation.  She was seen in January following an episode of syncope that occurred on December 2017 while walking in a store. The syncopal event was preceded by a sensation of flushing, dizziness and diaphoresis and she had brief loss of consciousness under 5 minutes. She did have one syncopal episode as a child. A 30 day event monitor was prescribed showed occasional PACs, rare bursts of nonsustained atrial tachycardia and a 1 hour episode of atrial fibrillation with controlled ventricular rate which was asymptomatic.  Anticoagulation was initiated on February 2 and was recommended that she reduce her intake of alcohol. Has not had syncope. Chest not had any bleeding problems on anticoagulation. Has not had any interim palpitations. Blood pressure control has been fair with systolic blood pressure generally less than 140 when checked at home.    Past Medical History:  Diagnosis Date  . Arthritis   . Chest pain    a. 2014: NST with no evidence of ischemia  . H/O echocardiogram    a. 2014: echo showing EF of 65-70% with Grade 1 DD, moderate TR, and mild to moderate MR  . Hyperlipidemia   . Hypertension     Past Surgical History:  Procedure Laterality Date  . ABDOMINAL HYSTERECTOMY      TAH/BSO (menorrhagia)  . bunion repair    . HERNIA REPAIR      Current Medications: Outpatient Medications Prior to Visit  Medication Sig Dispense Refill  . apixaban (ELIQUIS) 5 MG TABS tablet Take 1 tablet (5 mg total) by mouth 2 (two) times daily. 60 tablet 6  . ezetimibe (ZETIA) 10 MG tablet Take 1 tablet (10 mg total) by mouth daily. 30 tablet 5  . fish oil-omega-3 fatty acids 1000 MG capsule Take 2 capsules (2 g total) by mouth daily. 60 capsule 11  . hydrochlorothiazide (HYDRODIURIL) 25 MG tablet TAKE ONE TABLET BY MOUTH ONCE DAILY 90 tablet 1  . metoprolol tartrate (LOPRESSOR) 25 MG tablet Take 1 tablet (25 mg total) by mouth 2 (two) times daily. 60 tablet 3  . nitroGLYCERIN (NITROSTAT) 0.4 MG SL tablet Place 1 tablet (0.4 mg total) under the tongue every 5 (five) minutes as needed for chest pain. 20 tablet 0  . amLODipine (NORVASC) 10 MG tablet Take 1 tablet (10 mg total) by mouth daily. 90 tablet 1  . hydrALAZINE (APRESOLINE) 50 MG tablet Take 1 tablet (50 mg total) by mouth 3 (three) times daily. (Patient not taking: Reported on 02/09/2017) 90 tablet 5   No facility-administered medications prior to visit.      Allergies:   Codeine   Social History   Social History  . Marital status: Divorced    Spouse name: N/A  . Number  of children: N/A  . Years of education: N/A   Social History Main Topics  . Smoking status: Never Smoker  . Smokeless tobacco: Never Used  . Alcohol use 3.0 - 3.6 oz/week    5 - 6 Standard drinks or equivalent per week  . Drug use: No  . Sexual activity: Yes   Other Topics Concern  . None   Social History Narrative  . None     Family History:  The patient's family history includes Diabetes in her sister and sister; Heart disease in her father; Hypertension in her daughter, daughter, father, sister, and sister.   ROS:   Please see the history of present illness.    ROS All other systems reviewed and are negative.   PHYSICAL EXAM:   VS:   BP (!) 141/86   Pulse 84   Ht 5' 5.5" (1.664 m)   Wt 73.2 kg (161 lb 6.4 oz)   BMI 26.45 kg/m    GEN: Well nourished, well developed, in no acute distress  HEENT: normal  Neck: no JVD, carotid bruits, or masses Cardiac: RRR; S4 present, no murmurs, rubs, or gallops,no edema  Respiratory:  clear to auscultation bilaterally, normal work of breathing GI: soft, nontender, nondistended, + BS MS: no deformity or atrophy  Skin: warm and dry, no rash Neuro:  Alert and Oriented x 3, Strength and sensation are intact Psych: euthymic mood, full affect  Wt Readings from Last 3 Encounters:  02/09/17 73.2 kg (161 lb 6.4 oz)  12/08/16 72.4 kg (159 lb 9.6 oz)  11/13/16 72.6 kg (160 lb)      Studies/Labs Reviewed:   EKG:  EKG is not ordered today.    Recent Labs: 11/02/2016: ALT 15; BUN 16; Creat 1.37; Hemoglobin 11.9; Platelets 219; Potassium 3.6; Sodium 140   Lipid Panel    Component Value Date/Time   CHOL 209 (H) 05/07/2014 0852   CHOL 191 07/10/2013 0832   TRIG 252 (H) 05/07/2014 0852   TRIG 210 (H) 07/10/2013 0832   HDL 39 (L) 05/07/2014 0852   HDL 52 07/10/2013 0832   CHOLHDL 5.4 05/07/2014 0852   VLDL 50 (H) 05/07/2014 0852   LDLCALC 120 (H) 05/07/2014 0852   LDLCALC 97 07/10/2013 0832    Additional studies/ records that were reviewed today include:  Notes from B. Strader    ASSESSMENT:    1. Paroxysmal atrial fibrillation (HCC)   2. Benign essential HTN   3. Non-rheumatic mitral regurgitation   4. Vasovagal syncope   5. Long term current use of anticoagulant      PLAN:  In order of problems listed above:  1. AFib: This was incidentally discovered and probably has little if anything to do with her syncope. There was no evidence of postconversion pause and the rate was spontaneously controlled on her current medications. CHADSVasc 3 (age, gender, HTN) far tolerating anticoagulation without any problems. Her echo in 2014 showed findings compatible with  hypertensive heart disease and showed a moderate to severely dilated left atrium. It is likely that she will have recurrent arrhythmia, but antiarrhythmic therapy is not indicated at this point. 2. HTN: Fair control on current medications. No changes made. 3. Mild/moderate MR: This is not audible on physical exam and did not appear to be hemodynamically significant. Consider repeat echo she develops symptoms or if the burden of arrhythmia increases. 4. Syncope: Clinically, this had all the hallmarks of a neurally mediated syncope event, rather than arrhythmia. Avoid dehydration, extreme heat, protracted  orthostasis with moving, heed prodromal symptoms immediately and lay down if they occur. 5. Eliquis: Reviewed the fact that this increases the risk of bleeding complications, but this is outweighed by the reduction in stroke 6. Alcohol use: Recent increase in alcohol use was associated with multiple deaths in her family and social stressors. This may have led to increasing propensity for arrhythmia and vasovagal syncope. Restrict to no more than 2 beverages a day, 7 drinks a week.     Medication Adjustments/Labs and Tests Ordered: Current medicines are reviewed at length with the patient today.  Concerns regarding medicines are outlined above.  Medication changes, Labs and Tests ordered today are listed in the Patient Instructions below. Patient Instructions  Dr Royann Shivers recommends that you schedule a follow-up appointment in 12 months. You will receive a reminder letter in the mail two months in advance. If you don't receive a letter, please call our office to schedule the follow-up appointment.  If you need a refill on your cardiac medications before your next appointment, please call your pharmacy.    Signed, Thurmon Fair, MD  02/09/2017 3:24 PM    [facility] [address] [place], [place], [place]  [address] Phone: [phone]; Fax: [phone]

## 2017-02-19 ENCOUNTER — Telehealth: Payer: Self-pay | Admitting: Cardiovascular Disease

## 2017-02-19 NOTE — Telephone Encounter (Signed)
Returned call to patient. Informed her there is no generic for Eliquis. Advised she can see if she qualifies for patient assistance. Will print paperwork and provider patient w/her portion and give MD portion to C. Truitt, CMA for MD to complete. Patient would like to complete her section and bring to office and have our office submit all paperwork on her behalf.   She has 1 tablet of Eliquis left  We only have Eliquis 2.5mg  tablets in supply. Provided patient with 2 boxes of these & called her w/instructions to take two 2.5mg  tabs twice daily to equal her  dose. Advised that she call back mid-week to check on more samples. Samples & patient assistance app left for patient to pick up.

## 2017-02-19 NOTE — Telephone Encounter (Signed)
New message    Pt verbalized that she want to go on a generic prescription brand for eliquis   The regular brand is over 200.00 for pt

## 2017-02-21 ENCOUNTER — Telehealth: Payer: Self-pay | Admitting: Cardiovascular Disease

## 2017-02-21 NOTE — Telephone Encounter (Signed)
Patient calling the office for samples of medication:   1.  What medication and dosage are you requesting samples for? Eliquis-Pt said on Monday ,the nurse told her to call and see if we have samples 2.  Are you currently out of this medication?  No,not quite

## 2017-02-21 NOTE — Telephone Encounter (Signed)
Spoke to the patient to inform her that samples of Eliquis  would be waiting for her at the front desk.  Medication Samples have been provided to the patient.  Drug name: Eliquis       Strength: 5 mg        Qty: 2 boxes (28 tabs)  LOT: [id number]  Exp.Date: 3/20  Patient verbalized her appreciation.

## 2017-03-14 MED ORDER — APIXABAN 5 MG PO TABS: 5.0000 mg | ORAL_TABLET | Freq: Two times a day (BID) | ORAL | 3 refills | Status: DC

## 2017-03-14 NOTE — Addendum Note (Signed)
Addended by: Neta EhlersRUITT, ANGELA M on: 03/14/2017 05:37 PM   Modules accepted: Orders

## 2017-03-27 ENCOUNTER — Ambulatory Visit (INDEPENDENT_AMBULATORY_CARE_PROVIDER_SITE_OTHER): Payer: Medicare HMO | Admitting: Family Medicine

## 2017-03-27 ENCOUNTER — Encounter: Payer: Self-pay | Admitting: Family Medicine

## 2017-03-27 VITALS — BP 120/60 | HR 78 | Temp 98.1°F | Resp 14 | Ht 65.5 in | Wt 163.0 lb

## 2017-03-27 DIAGNOSIS — B372 Candidiasis of skin and nail: Secondary | ICD-10-CM

## 2017-03-27 MED ORDER — FLUCONAZOLE 150 MG PO TABS: 150.0000 mg | ORAL_TABLET | ORAL | 0 refills | Status: DC

## 2017-03-27 MED ORDER — CLOTRIMAZOLE-BETAMETHASONE 1-0.05 % EX CREA: 1.0000 "application " | TOPICAL_CREAM | Freq: Two times a day (BID) | CUTANEOUS | 0 refills | Status: DC

## 2017-03-27 NOTE — Progress Notes (Signed)
Subjective:    Patient ID: Christine Hogan, female    DOB: May 27, 1946, 71 y.o.   MRN: [id number]  HPI A she has had a rash in her private area for over 6 months. It began in the crease between her perineum and her thigh bilaterally. It itches and burns. She is tried a combination of Vaseline and cortisone cream intermittently over the last few months with minimal success. Today on exam she has a bright erythematous serpiginous rash on the upper medial thighs bilaterally extending into the crease and onto the perineum consistent with Candida intertrigo.  It is moderate in severity. Past Medical History:  Diagnosis Date  . Arthritis   . Chest pain    a. 2014: NST with no evidence of ischemia  . H/O echocardiogram    a. 2014: echo showing EF of 65-70% with Grade 1 DD, moderate TR, and mild to moderate MR  . Hyperlipidemia   . Hypertension    Past Surgical History:  Procedure Laterality Date  . ABDOMINAL HYSTERECTOMY     TAH/BSO (menorrhagia)  . bunion repair    . HERNIA REPAIR     Current Outpatient Prescriptions on File Prior to Visit  Medication Sig Dispense Refill  . amLODipine (NORVASC) 10 MG tablet TAKE ONE TABLET BY MOUTH ONCE DAILY 90 tablet 1  . apixaban (ELIQUIS) 5 MG TABS tablet Take 1 tablet (5 mg total) by mouth 2 (two) times daily. 180 tablet 3  . ezetimibe (ZETIA) 10 MG tablet Take 1 tablet (10 mg total) by mouth daily. 30 tablet 5  . fish oil-omega-3 fatty acids 1000 MG capsule Take 2 capsules (2 g total) by mouth daily. 60 capsule 11  . hydrALAZINE (APRESOLINE) 50 MG tablet Take 1 tablet (50 mg total) by mouth 3 (three) times daily. 90 tablet 11  . hydrochlorothiazide (HYDRODIURIL) 25 MG tablet TAKE ONE TABLET BY MOUTH ONCE DAILY 90 tablet 1  . nitroGLYCERIN (NITROSTAT) 0.4 MG SL tablet Place 1 tablet (0.4 mg total) under the tongue every 5 (five) minutes as needed for chest pain. 20 tablet 0  . metoprolol tartrate (LOPRESSOR) 25 MG tablet Take 1 tablet (25 mg total) by  mouth 2 (two) times daily. 60 tablet 3   No current facility-administered medications on file prior to visit.    Allergies  Allergen Reactions  . Codeine Nausea And Vomiting    dizziness   Social History   Social History  . Marital status: Divorced    Spouse name: N/A  . Number of children: N/A  . Years of education: N/A   Occupational History  . Not on file.   Social History Main Topics  . Smoking status: Never Smoker  . Smokeless tobacco: Never Used  . Alcohol use 3.0 - 3.6 oz/week    5 - 6 Standard drinks or equivalent per week  . Drug use: No  . Sexual activity: Yes   Other Topics Concern  . Not on file   Social History Narrative  . No narrative on file      Review of Systems  All other systems reviewed and are negative.      Objective:   Physical Exam  Constitutional: She appears well-developed and well-nourished.  Cardiovascular: Normal rate and normal heart sounds.   Pulmonary/Chest: Effort normal and breath sounds normal.  Skin: Skin is warm. Rash noted. There is erythema.     Vitals reviewed.         Assessment & Plan:  Candidal intertrigo  Given the severity, I'll start the patient on Diflucan 150 mg by mouth every other day for the next 2 weeks and supplement with Lotrisone cream twice daily for the next 2 weeks. Recheck if no better in 2 weeks or sooner if worse.

## 2017-03-29 ENCOUNTER — Telehealth: Payer: Self-pay | Admitting: Cardiovascular Disease

## 2017-03-29 NOTE — Telephone Encounter (Signed)
Patient calling the office for samples of medication:   1.  What medication and dosage are you requesting samples for? eliquis 1pill 2x day 2.  Are you currently out of this medication? 3 days

## 2017-03-30 NOTE — Telephone Encounter (Signed)
Patient aware samples are available - she states she has picked them up. She thought someone was calling her back about paperwork for eliquis assistance. Unsure of this.   Spoke with Stanton Kidneyebra, RN after phone call - she had left a message for patient that this paperwork was faxed and will be re-faxed.

## 2017-03-30 NOTE — Telephone Encounter (Signed)
Follow up    Pt is calling to follow up on samples.   Patient calling the office for samples of medication:   1.  What medication and dosage are you requesting samples for? Eliquis 5 mg  2.  Are you currently out of this medication? Pt states she took last pill this morning.

## 2017-03-30 NOTE — Telephone Encounter (Signed)
Medication samples have been provided to the patient.  Drug name: elqiuis 5mg   Qty: 2 boxes  LOT: [id number]  Exp.Date: 08/2019  Samples left at front desk for patient pick-up. LM for patient that samples are at front desk.  Julaine Fusilkins, Jaydee Conran M 10:01 AM 03/30/2017

## 2017-06-08 ENCOUNTER — Other Ambulatory Visit: Payer: Self-pay | Admitting: Family Medicine

## 2017-07-04 ENCOUNTER — Other Ambulatory Visit: Payer: Self-pay | Admitting: Student

## 2017-07-05 NOTE — Telephone Encounter (Signed)
This is Dr. Croitoru's pt 

## 2017-07-06 ENCOUNTER — Ambulatory Visit (INDEPENDENT_AMBULATORY_CARE_PROVIDER_SITE_OTHER): Payer: Medicare HMO | Admitting: Family Medicine

## 2017-07-06 ENCOUNTER — Encounter: Payer: Self-pay | Admitting: Family Medicine

## 2017-07-06 VITALS — BP 162/86 | HR 56 | Temp 98.2°F | Resp 14 | Ht 65.5 in | Wt 160.0 lb

## 2017-07-06 DIAGNOSIS — I10 Essential (primary) hypertension: Secondary | ICD-10-CM | POA: Diagnosis not present

## 2017-07-06 DIAGNOSIS — R0609 Other forms of dyspnea: Secondary | ICD-10-CM

## 2017-07-06 DIAGNOSIS — H6121 Impacted cerumen, right ear: Secondary | ICD-10-CM

## 2017-07-06 DIAGNOSIS — R0600 Dyspnea, unspecified: Secondary | ICD-10-CM

## 2017-07-06 MED ORDER — LISINOPRIL 40 MG PO TABS: 40.0000 mg | ORAL_TABLET | Freq: Every day | ORAL | 3 refills | Status: DC

## 2017-07-06 MED ORDER — METOPROLOL TARTRATE 25 MG PO TABS: 25.0000 mg | ORAL_TABLET | Freq: Two times a day (BID) | ORAL | 3 refills | Status: DC

## 2017-07-06 NOTE — Progress Notes (Signed)
Subjective:    Patient ID: Christine Hogan, female    DOB: [birth year], 71 y.o.   MRN: [id number]  HPI  Reports hearing loss in right ear.  Examination reveals cerumen impaction which was easily removed with irrigation and lavage curing the patient's symptoms. However more concerning, her blood pressure is streaking [place]. She admits that she is not taking the hydralazine 3 times a day as directed. Compliance has been an issue in the past for this patient. What is more concerning is she also reports dyspnea on exertion. She denies any orthopnea. She denies any paroxysmal my internal dyspnea. However she is getting easily winded walking to the mailbox or taking out the garbage. She adamantly denies chest pain. She denies chest pressure. She denies any tachycardia. She has a long-standing history of uncontrolled hypertension Past Medical History:  Diagnosis Date  . Arthritis   . Chest pain    a. 2014: NST with no evidence of ischemia  . H/O echocardiogram    a. 2014: echo showing EF of 65-70% with Grade 1 DD, moderate TR, and mild to moderate MR  . Hyperlipidemia   . Hypertension    Past Surgical History:  Procedure Laterality Date  . ABDOMINAL HYSTERECTOMY     TAH/BSO (menorrhagia)  . bunion repair    . HERNIA REPAIR     Current Outpatient Prescriptions on File Prior to Visit  Medication Sig Dispense Refill  . amLODipine (NORVASC) 10 MG tablet TAKE ONE TABLET BY MOUTH ONCE DAILY 90 tablet 1  . apixaban (ELIQUIS) 5 MG TABS tablet Take 1 tablet (5 mg total) by mouth 2 (two) times daily. 180 tablet 3  . clotrimazole-betamethasone (LOTRISONE) cream Apply 1 application topically 2 (two) times daily. 30 g 0  . ezetimibe (ZETIA) 10 MG tablet Take 1 tablet (10 mg total) by mouth daily. 30 tablet 5  . fish oil-omega-3 fatty acids 1000 MG capsule Take 2 capsules (2 g total) by mouth daily. 60 capsule 11  . hydrALAZINE (APRESOLINE) 50 MG tablet Take 1 tablet (50 mg total) by mouth 3 (three) times  daily. 90 tablet 11  . hydrochlorothiazide (HYDRODIURIL) 25 MG tablet TAKE ONE TABLET BY MOUTH ONCE DAILY 90 tablet 1  . nitroGLYCERIN (NITROSTAT) 0.4 MG SL tablet Place 1 tablet (0.4 mg total) under the tongue every 5 (five) minutes as needed for chest pain. 20 tablet 0   No current facility-administered medications on file prior to visit.    Allergies  Allergen Reactions  . Codeine Nausea And Vomiting    dizziness   Social History   Social History  . Marital status: Divorced    Spouse name: N/A  . Number of children: N/A  . Years of education: N/A   Occupational History  . Not on file.   Social History Main Topics  . Smoking status: Never Smoker  . Smokeless tobacco: Never Used  . Alcohol use 3.0 - 3.6 oz/week    5 - 6 Standard drinks or equivalent per week  . Drug use: No  . Sexual activity: Yes   Other Topics Concern  . Not on file   Social History Narrative  . No narrative on file     Review of Systems  All other systems reviewed and are negative.      Objective:   Physical Exam  Neck: No JVD present.  Cardiovascular: Normal rate, regular rhythm and normal heart sounds.   Pulmonary/Chest: Effort normal and breath sounds normal. No respiratory distress. She  has no wheezes. She has no rales.  Musculoskeletal: She exhibits no edema.          Assessment & Plan:  Hearing loss of right ear due to cerumen impaction  Dyspnea on exertion - Plan: ECHOCARDIOGRAM COMPLETE  Benign essential HTN - Plan: ECHOCARDIOGRAM COMPLETE  Cerumen impaction was remedied using irrigation and lavage. I believe her blood pressures elevated due to noncompliance. I doubt the patient is taking her medication regularly and I certainly don't believe she is taking hydralazine 3 times a day. She even admits is much. Therefore I will discontinue hydralazine and replace it with lisinopril 40 mg a day. Meanwhile I will schedule patient for an echocardiogram to evaluate for systolic  failure given her dyspnea on exertion and her long-standing history of hypertension. If there is evidence of abnormal wall motion, we may also need to consider stress test to evaluate for ischemia.

## 2017-07-11 ENCOUNTER — Telehealth: Payer: Self-pay | Admitting: *Deleted

## 2017-07-11 NOTE — Telephone Encounter (Signed)
Received call from patient.   Reports that she has been monitoring her BP over weekend. States that BP is usually around 120/80, but was noted elevated x1 on 07/07/2017 at 147/86.   Patient states that she is taking Lisinopril 40mg  PO QD, and Amlodipine 10mg  PO QD.   MD to be made aware.

## 2017-07-12 NOTE — Telephone Encounter (Signed)
Those bp sound good, I would not change further at this time.  Goal is less than 140/90

## 2017-07-12 NOTE — Telephone Encounter (Signed)
Call placed to patient. LMTRC.  

## 2017-07-13 NOTE — Telephone Encounter (Signed)
Call placed to patient and patient made aware.  

## 2017-07-20 ENCOUNTER — Other Ambulatory Visit: Payer: Self-pay

## 2017-07-20 ENCOUNTER — Ambulatory Visit (HOSPITAL_COMMUNITY): Payer: Medicare HMO | Attending: Cardiovascular Disease

## 2017-07-20 DIAGNOSIS — I371 Nonrheumatic pulmonary valve insufficiency: Secondary | ICD-10-CM | POA: Insufficient documentation

## 2017-07-20 DIAGNOSIS — I10 Essential (primary) hypertension: Secondary | ICD-10-CM

## 2017-07-20 DIAGNOSIS — I081 Rheumatic disorders of both mitral and tricuspid valves: Secondary | ICD-10-CM | POA: Insufficient documentation

## 2017-07-20 DIAGNOSIS — R0609 Other forms of dyspnea: Secondary | ICD-10-CM | POA: Insufficient documentation

## 2017-07-20 DIAGNOSIS — R0600 Dyspnea, unspecified: Secondary | ICD-10-CM

## 2017-07-23 ENCOUNTER — Encounter: Payer: Self-pay | Admitting: Family Medicine

## 2017-07-23 DIAGNOSIS — I5032 Chronic diastolic (congestive) heart failure: Secondary | ICD-10-CM | POA: Insufficient documentation

## 2017-07-23 DIAGNOSIS — I5189 Other ill-defined heart diseases: Secondary | ICD-10-CM | POA: Insufficient documentation

## 2017-07-30 ENCOUNTER — Telehealth: Payer: Self-pay | Admitting: Family Medicine

## 2017-07-30 NOTE — Telephone Encounter (Signed)
Called and spoke to pt and she states that the Lisinopril gave her a cough and would like to change to something different. Please advise.

## 2017-07-30 NOTE — Telephone Encounter (Signed)
Pt called stating that she cannot take lisinopril that was prescribed can we call in something different to [facility] on [address].

## 2017-07-31 NOTE — Telephone Encounter (Signed)
Switch to losartan 100 mg poqday 

## 2017-08-01 MED ORDER — LOSARTAN POTASSIUM 100 MG PO TABS: 100.0000 mg | ORAL_TABLET | Freq: Every day | ORAL | 3 refills | Status: DC

## 2017-08-01 NOTE — Telephone Encounter (Signed)
[facility] - med sent to [facility]

## 2017-08-01 NOTE — Addendum Note (Signed)
Addended by: Legrand Rams B on: 08/01/2017 11:49 AM   Modules accepted: Orders

## 2017-08-02 ENCOUNTER — Encounter: Payer: Self-pay | Admitting: *Deleted

## 2017-08-02 NOTE — Telephone Encounter (Signed)
Patient aware of providers recommendations and med sent to pharm 

## 2017-08-09 ENCOUNTER — Telehealth: Payer: Self-pay

## 2017-08-09 NOTE — Telephone Encounter (Signed)
Patient called left message that she had a bad cough that was causing chest pain. I called patient back no answer lvm for her to call the office to schedule an appointment to be seen

## 2017-09-18 ENCOUNTER — Telehealth: Payer: Self-pay | Admitting: Cardiovascular Disease

## 2017-09-18 NOTE — Telephone Encounter (Signed)
Samples at the front desk Pt notified 

## 2017-09-18 NOTE — Telephone Encounter (Signed)
New message     Patient calling the office for samples of medication:   1.  What medication and dosage are you requesting samples for?  eliquis 5mg   2.  Are you currently out of this medication?  Has 3 days left has to wait 2 weeks for mail order

## 2017-10-11 ENCOUNTER — Other Ambulatory Visit: Payer: Self-pay | Admitting: Family Medicine

## 2017-10-12 ENCOUNTER — Telehealth: Payer: Self-pay | Admitting: Cardiovascular Disease

## 2017-10-12 MED ORDER — APIXABAN 5 MG PO TABS: 5.0000 mg | ORAL_TABLET | Freq: Two times a day (BID) | ORAL | 0 refills | Status: DC

## 2017-10-12 NOTE — Telephone Encounter (Signed)
Called patient - she was informed we do not have samples of this medication on-hand, but was agreeable to pick up a 2 week supply at local pharmacy while she awaits delivery of her medication. Rx sent w instruction. Pt aware to call back if any further needs.

## 2017-10-12 NOTE — Telephone Encounter (Signed)
New Message      Patient calling the office for samples of medication:   1.  What medication and dosage are you requesting samples for?   eliquis 5 mg   2.  Are you currently out of this medication?  Leaving tomorrow for cruise still has not received mail order yet

## 2017-11-15 ENCOUNTER — Telehealth: Payer: Self-pay | Admitting: Cardiovascular Disease

## 2017-11-15 NOTE — Telephone Encounter (Signed)
Spoke with pt letting her know samples of Eliquis was placed at front desk, pt voice understanding and thanks.

## 2017-11-15 NOTE — Telephone Encounter (Signed)
New message    Pt c/o medication issue:  1. Name of Medication: apixaban (ELIQUIS) 5 MG TABS tablet  2. How are you currently taking this medication (dosage and times per day)? As prescribed  3. Are you having a reaction (difficulty breathing--STAT)? No  4. What is your medication issue? Patient states medication is too costly. Would like to change medication.    Patient calling the office for samples of medication:  1.  What medication and dosage are you requesting samples for?apixaban (ELIQUIS) 5 MG TABS tablet  2.  Are you currently out of this medication? No

## 2017-12-24 ENCOUNTER — Encounter: Payer: Self-pay | Admitting: Family Medicine

## 2017-12-24 ENCOUNTER — Ambulatory Visit (INDEPENDENT_AMBULATORY_CARE_PROVIDER_SITE_OTHER): Payer: Medicare HMO | Admitting: Family Medicine

## 2017-12-24 VITALS — BP 126/70 | HR 58 | Temp 98.1°F | Resp 14 | Ht 65.5 in | Wt 162.0 lb

## 2017-12-24 DIAGNOSIS — E782 Mixed hyperlipidemia: Secondary | ICD-10-CM

## 2017-12-24 DIAGNOSIS — Z1231 Encounter for screening mammogram for malignant neoplasm of breast: Secondary | ICD-10-CM | POA: Diagnosis not present

## 2017-12-24 DIAGNOSIS — Z0000 Encounter for general adult medical examination without abnormal findings: Secondary | ICD-10-CM

## 2017-12-24 DIAGNOSIS — Z780 Asymptomatic menopausal state: Secondary | ICD-10-CM

## 2017-12-24 DIAGNOSIS — N189 Chronic kidney disease, unspecified: Secondary | ICD-10-CM | POA: Diagnosis not present

## 2017-12-24 DIAGNOSIS — I480 Paroxysmal atrial fibrillation: Secondary | ICD-10-CM | POA: Diagnosis not present

## 2017-12-24 DIAGNOSIS — Z1239 Encounter for other screening for malignant neoplasm of breast: Secondary | ICD-10-CM

## 2017-12-24 DIAGNOSIS — I4891 Unspecified atrial fibrillation: Secondary | ICD-10-CM | POA: Insufficient documentation

## 2017-12-24 DIAGNOSIS — Z23 Encounter for immunization: Secondary | ICD-10-CM

## 2017-12-24 DIAGNOSIS — I10 Essential (primary) hypertension: Secondary | ICD-10-CM | POA: Diagnosis not present

## 2017-12-24 LAB — LIPID PANEL
Cholesterol: 247 mg/dL — ABNORMAL HIGH (ref <200)
HDL: 48 mg/dL — ABNORMAL LOW (ref >50)
LDL Cholesterol (Calc): 158 mg/dL (calc) — ABNORMAL HIGH
Non-HDL Cholesterol (Calc): 199 mg/dL (calc) — ABNORMAL HIGH (ref <130)
Total CHOL/HDL Ratio: 5.1 (calc) — ABNORMAL HIGH (ref <5.0)
Triglycerides: 248 mg/dL — ABNORMAL HIGH (ref <150)

## 2017-12-24 LAB — COMPLETE METABOLIC PANEL WITH GFR
AG Ratio: 1.5 (calc) (ref 1.0–2.5)
ALT: 17 U/L (ref 6–29)
AST: 20 U/L (ref 10–35)
Albumin: 4.2 g/dL (ref 3.6–5.1)
Alkaline phosphatase (APISO): 59 U/L (ref 33–130)
BUN/Creatinine Ratio: 10 (calc) (ref 6–22)
BUN: 17 mg/dL (ref 7–25)
CO2: 29 mmol/L (ref 20–32)
Calcium: 10.2 mg/dL (ref 8.6–10.4)
Chloride: 103 mmol/L (ref 98–110)
Creat: 1.65 mg/dL — ABNORMAL HIGH (ref 0.50–0.99)
GFR, Est African American: 36 mL/min/{1.73_m2} — ABNORMAL LOW (ref >=60)
GFR, Est Non African American: 31 mL/min/{1.73_m2} — ABNORMAL LOW (ref >=60)
Globulin: 2.8 g/dL (calc) (ref 1.9–3.7)
Glucose, Bld: 114 mg/dL — ABNORMAL HIGH (ref 65–99)
Potassium: 3.8 mmol/L (ref 3.5–5.3)
Sodium: 142 mmol/L (ref 135–146)
Total Bilirubin: 0.6 mg/dL (ref 0.2–1.2)
Total Protein: 7 g/dL (ref 6.1–8.1)

## 2017-12-24 LAB — CBC WITH DIFFERENTIAL/PLATELET
Basophils Absolute: 21 cells/uL (ref 0–200)
Basophils Relative: 0.4 %
Eosinophils Absolute: 68 cells/uL (ref 15–500)
Eosinophils Relative: 1.3 %
HCT: 34.8 % — ABNORMAL LOW (ref 35.0–45.0)
Hemoglobin: 11.9 g/dL (ref 11.7–15.5)
Lymphs Abs: 1872 cells/uL (ref 850–3900)
MCH: 29.7 pg (ref 27.0–33.0)
MCHC: 34.2 g/dL (ref 32.0–36.0)
MCV: 86.8 fL (ref 80.0–100.0)
MPV: 10.1 fL (ref 7.5–12.5)
Monocytes Relative: 9.2 %
Neutro Abs: 2761 cells/uL (ref 1500–7800)
Neutrophils Relative %: 53.1 %
Platelets: 233 10*3/uL (ref 140–400)
RBC: 4.01 10*6/uL (ref 3.80–5.10)
RDW: 13.2 % (ref 11.0–15.0)
Total Lymphocyte: 36 %
WBC mixed population: 478 cells/uL (ref 200–950)
WBC: 5.2 10*3/uL (ref 3.8–10.8)

## 2017-12-24 NOTE — Progress Notes (Signed)
Subjective:    Patient ID: Christine Hogan, female    DOB: 26-Jul-1946, 72 y.o.   MRN: [id number]  HPI  Patient is here today for complete physical exam.  She is overdue for a mammogram.  She is overdue for a bone density test.  She is due for a colonoscopy but she refuses.  She would be willing to proceed with cologuard.  She is due for Pneumovax 23, the flu shot, and the shingles vaccine. Past Medical History:  Diagnosis Date  . Arthritis   . Chest pain    a. 2014: NST with no evidence of ischemia  . Diastolic dysfunction    grade 2 with dyspnea on exertion  . H/O echocardiogram    a. 2014: echo showing EF of 65-70% with Grade 1 DD, moderate TR, and mild to moderate MR  . Hyperlipidemia   . Hypertension    Past Surgical History:  Procedure Laterality Date  . ABDOMINAL HYSTERECTOMY     TAH/BSO (menorrhagia)  . bunion repair    . HERNIA REPAIR     Current Outpatient Medications on File Prior to Visit  Medication Sig Dispense Refill  . amLODipine (NORVASC) 10 MG tablet TAKE 1 TABLET BY MOUTH ONCE DAILY 90 tablet 1  . apixaban (ELIQUIS) 5 MG TABS tablet Take 1 tablet (5 mg total) by mouth 2 (two) times daily. 28 tablet 0  . clotrimazole-betamethasone (LOTRISONE) cream Apply 1 application topically 2 (two) times daily. 30 g 0  . ezetimibe (ZETIA) 10 MG tablet Take 1 tablet (10 mg total) by mouth daily. 30 tablet 5  . fish oil-omega-3 fatty acids 1000 MG capsule Take 2 capsules (2 g total) by mouth daily. 60 capsule 11  . hydrochlorothiazide (HYDRODIURIL) 25 MG tablet TAKE ONE TABLET BY MOUTH ONCE DAILY 90 tablet 1  . losartan (COZAAR) 100 MG tablet Take 1 tablet (100 mg total) by mouth daily. 90 tablet 3  . metoprolol tartrate (LOPRESSOR) 25 MG tablet Take 1 tablet (25 mg total) by mouth 2 (two) times daily. 60 tablet 3  . nitroGLYCERIN (NITROSTAT) 0.4 MG SL tablet Place 1 tablet (0.4 mg total) under the tongue every 5 (five) minutes as needed for chest pain. 20 tablet 0   No current  facility-administered medications on file prior to visit.    Allergies  Allergen Reactions  . Codeine Nausea And Vomiting    dizziness   Social History   Socioeconomic History  . Marital status: Divorced    Spouse name: Not on file  . Number of children: Not on file  . Years of education: Not on file  . Highest education level: Not on file  Social Needs  . Financial resource strain: Not on file  . Food insecurity - worry: Not on file  . Food insecurity - inability: Not on file  . Transportation needs - medical: Not on file  . Transportation needs - non-medical: Not on file  Occupational History  . Not on file  Tobacco Use  . Smoking status: Never Smoker  . Smokeless tobacco: Never Used  Substance and Sexual Activity  . Alcohol use: Yes    Alcohol/week: 3.0 - 3.6 oz    Types: 5 - 6 Standard drinks or equivalent per week  . Drug use: No  . Sexual activity: Yes  Other Topics Concern  . Not on file  Social History Narrative  . Not on file   Family History  Problem Relation Age of Onset  . Hypertension Father   .  Heart disease Father        pacemaker- followedby Dr. Royann Shivers  . Diabetes Sister   . Hypertension Sister   . Diabetes Sister   . Hypertension Sister   . Hypertension Daughter   . Hypertension Daughter      Review of Systems  All other systems reviewed and are negative.      Objective:   Physical Exam  Constitutional: She is oriented to person, place, and time. She appears well-developed and well-nourished. No distress.  HENT:  Head: Normocephalic and atraumatic.  Right Ear: External ear normal.  Left Ear: External ear normal.  Nose: Nose normal.  Mouth/Throat: Oropharynx is clear and moist. No oropharyngeal exudate.  Eyes: EOM are normal. Pupils are equal, round, and reactive to light. Right eye exhibits no discharge. Left eye exhibits no discharge. No scleral icterus.  Neck: Normal range of motion. Neck supple. No JVD present. No tracheal  deviation present. No thyromegaly present.  Cardiovascular: Normal rate, regular rhythm, normal heart sounds and intact distal pulses. Exam reveals no gallop and no friction rub.  No murmur heard. Pulmonary/Chest: Effort normal and breath sounds normal. No stridor. No respiratory distress. She has no wheezes. She has no rales. She exhibits no tenderness.  Abdominal: Soft. Bowel sounds are normal. She exhibits no distension and no mass. There is no tenderness. There is no rebound and no guarding.  Musculoskeletal: Normal range of motion. She exhibits no edema or tenderness.  Lymphadenopathy:    She has no cervical adenopathy.  Neurological: She is alert and oriented to person, place, and time. She has normal reflexes. No cranial nerve deficit. She exhibits normal muscle tone. Coordination normal.  Skin: Skin is warm. No rash noted. She is not diaphoretic. No erythema. No pallor.  Psychiatric: She has a normal mood and affect. Her behavior is normal. Judgment and thought content normal.  Vitals reviewed.         Assessment & Plan:  General medical exam  Benign essential HTN  Paroxysmal atrial fibrillation (HCC)  Mixed hyperlipidemia  Chronic renal impairment, unspecified CKD stage Blood pressure today is outstanding.  I will schedule the patient for the bone density test along with a mammogram.  I will screen the patient with a CBC, CMP, and fasting lipid panel.  I have recommended a colonoscopy but she declines however she will consent to allow me to schedule her for cologuard.  Patient received Pneumovax 23 today along with the flu shot.  We discussed and I recommended the shingles vaccine but she deferred at the present time

## 2017-12-24 NOTE — Addendum Note (Signed)
Addended by: Legrand RamsWILLIS, Terryn Rosenkranz B on: 12/24/2017 11:51 AM   Modules accepted: Orders

## 2017-12-26 ENCOUNTER — Other Ambulatory Visit: Payer: Self-pay | Admitting: Family Medicine

## 2017-12-26 MED ORDER — ROSUVASTATIN CALCIUM 10 MG PO TABS: 10.0000 mg | ORAL_TABLET | Freq: Every day | ORAL | 1 refills | Status: DC

## 2018-01-08 ENCOUNTER — Other Ambulatory Visit: Payer: Self-pay | Admitting: Family Medicine

## 2018-01-08 DIAGNOSIS — Z780 Asymptomatic menopausal state: Secondary | ICD-10-CM

## 2018-01-08 DIAGNOSIS — Z1239 Encounter for other screening for malignant neoplasm of breast: Secondary | ICD-10-CM

## 2018-01-29 ENCOUNTER — Ambulatory Visit
Admission: RE | Admit: 2018-01-29 | Discharge: 2018-01-29 | Disposition: A | Discharge status: Home / Self Care | Payer: Medicare HMO | Source: Ambulatory Visit | Attending: Family Medicine | Admitting: Family Medicine

## 2018-01-29 DIAGNOSIS — Z1231 Encounter for screening mammogram for malignant neoplasm of breast: Secondary | ICD-10-CM | POA: Diagnosis not present

## 2018-01-29 DIAGNOSIS — Z780 Asymptomatic menopausal state: Secondary | ICD-10-CM | POA: Diagnosis not present

## 2018-01-29 DIAGNOSIS — Z1239 Encounter for other screening for malignant neoplasm of breast: Secondary | ICD-10-CM

## 2018-01-29 DIAGNOSIS — M85851 Other specified disorders of bone density and structure, right thigh: Secondary | ICD-10-CM | POA: Diagnosis not present

## 2018-01-30 ENCOUNTER — Encounter: Payer: Self-pay | Admitting: Family Medicine

## 2018-01-30 DIAGNOSIS — M8580 Other specified disorders of bone density and structure, unspecified site: Secondary | ICD-10-CM | POA: Insufficient documentation

## 2018-02-01 ENCOUNTER — Encounter: Payer: Self-pay | Admitting: Family Medicine

## 2018-02-08 ENCOUNTER — Telehealth: Payer: Self-pay | Admitting: Cardiovascular Disease

## 2018-02-08 NOTE — Telephone Encounter (Signed)
New message     Patient calling the office for samples of medication:   1.  What medication and dosage are you requesting samples for?  eliquis 5mg    2.  Are you currently out of this medication? Has 3 left   Also needs you to fill out paperwork for her to get medication free

## 2018-02-08 NOTE — Telephone Encounter (Signed)
Notified pt of sample medication available for pick up at the front desk along with patient assistance form.  Samples Given: Eliquis 5 mg x 2 boxes Lot # [id number] Expires 6/21 Yearly appointment schedule for 05/13/18 @ 9:20 with Dr. Royann Shiversroitoru

## 2018-02-18 ENCOUNTER — Other Ambulatory Visit: Payer: Self-pay | Admitting: Family Medicine

## 2018-03-12 ENCOUNTER — Telehealth: Payer: Self-pay | Admitting: Cardiovascular Disease

## 2018-03-12 NOTE — Telephone Encounter (Signed)
Called Pt and informed sample medications are available for pick up at the front desk.  Eliquis 5 mg qty: 2 boxes Loth #[id number] Exp: 3/21

## 2018-03-12 NOTE — Telephone Encounter (Signed)
New Message ° ° °Patient calling the office for samples of medication: ° ° °1.  What medication and dosage are you requesting samples for? apixaban (ELIQUIS) 5 MG TABS tablet ° °2.  Are you currently out of this medication?  yes ° ° °

## 2018-03-28 ENCOUNTER — Ambulatory Visit: Payer: Medicare HMO | Admitting: Family Medicine

## 2018-04-02 ENCOUNTER — Ambulatory Visit: Payer: Medicare HMO | Admitting: Family Medicine

## 2018-04-08 ENCOUNTER — Telehealth: Payer: Self-pay | Admitting: Cardiovascular Disease

## 2018-04-08 NOTE — Telephone Encounter (Signed)
New Message    Patient is calling about the paperwork she dropped off the help with the cost of Eliquis. Please call to discuss.

## 2018-04-08 NOTE — Telephone Encounter (Signed)
New Message    Patient calling the office for samples of medication:   1.  What medication and dosage are you requesting samples for? Eliquis  2.  Are you currently out of this medication? Patient only has two days.

## 2018-04-08 NOTE — Telephone Encounter (Signed)
Returned call to patient no answer.Left message on personal voice mail Eliquis 5 mg samples [facility] front desk.

## 2018-04-11 MED ORDER — APIXABAN 5 MG PO TABS: 5.0000 mg | ORAL_TABLET | Freq: Two times a day (BID) | ORAL | 3 refills | Status: DC

## 2018-04-11 NOTE — Telephone Encounter (Signed)
Prescription printed. Awaiting prescriber signature. Patient aware.

## 2018-05-03 NOTE — Telephone Encounter (Signed)
Patient assistance approved.  

## 2018-05-13 ENCOUNTER — Ambulatory Visit: Payer: Medicare HMO | Admitting: Cardiovascular Disease

## 2018-06-17 ENCOUNTER — Other Ambulatory Visit: Payer: Self-pay | Admitting: Family Medicine

## 2018-07-11 ENCOUNTER — Ambulatory Visit: Payer: Medicare HMO | Admitting: Cardiovascular Disease

## 2018-08-09 ENCOUNTER — Other Ambulatory Visit: Payer: Self-pay | Admitting: Family Medicine

## 2018-08-09 MED ORDER — ROSUVASTATIN CALCIUM 10 MG PO TABS: 10.0000 mg | ORAL_TABLET | Freq: Every day | ORAL | 0 refills | Status: DC

## 2018-08-26 ENCOUNTER — Other Ambulatory Visit: Payer: Self-pay | Admitting: Family Medicine

## 2018-09-19 ENCOUNTER — Other Ambulatory Visit: Payer: Self-pay | Admitting: Family Medicine

## 2018-10-08 ENCOUNTER — Encounter: Payer: Self-pay | Admitting: Cardiovascular Disease

## 2018-10-08 ENCOUNTER — Ambulatory Visit: Payer: Medicare HMO | Admitting: Cardiovascular Disease

## 2018-10-08 VITALS — BP 112/70 | HR 72 | Ht 65.5 in | Wt 157.2 lb

## 2018-10-08 DIAGNOSIS — I480 Paroxysmal atrial fibrillation: Secondary | ICD-10-CM | POA: Diagnosis not present

## 2018-10-08 DIAGNOSIS — Z7901 Long term (current) use of anticoagulants: Secondary | ICD-10-CM | POA: Diagnosis not present

## 2018-10-08 DIAGNOSIS — I10 Essential (primary) hypertension: Secondary | ICD-10-CM | POA: Diagnosis not present

## 2018-10-08 DIAGNOSIS — E782 Mixed hyperlipidemia: Secondary | ICD-10-CM

## 2018-10-08 DIAGNOSIS — I340 Nonrheumatic mitral (valve) insufficiency: Secondary | ICD-10-CM | POA: Insufficient documentation

## 2018-10-08 DIAGNOSIS — E785 Hyperlipidemia, unspecified: Secondary | ICD-10-CM | POA: Diagnosis not present

## 2018-10-08 DIAGNOSIS — Z79899 Other long term (current) drug therapy: Secondary | ICD-10-CM | POA: Diagnosis not present

## 2018-10-08 DIAGNOSIS — R55 Syncope and collapse: Secondary | ICD-10-CM | POA: Diagnosis not present

## 2018-10-08 LAB — LIPID PANEL
Chol/HDL Ratio: 2.6 ratio (ref 0.0–4.4)
Cholesterol, Total: 148 mg/dL (ref 100–199)
HDL: 57 mg/dL (ref >39)
LDL Calculated: 56 mg/dL (ref 0–99)
Triglycerides: 175 mg/dL — ABNORMAL HIGH (ref 0–149)
VLDL Cholesterol Cal: 35 mg/dL (ref 5–40)

## 2018-10-08 LAB — COMPREHENSIVE METABOLIC PANEL
ALT: 16 IU/L (ref 0–32)
AST: 24 IU/L (ref 0–40)
Albumin/Globulin Ratio: 1.7 (ref 1.2–2.2)
Albumin: 4.5 g/dL (ref 3.5–4.8)
Alkaline Phosphatase: 63 IU/L (ref 39–117)
BUN/Creatinine Ratio: 13 (ref 12–28)
BUN: 22 mg/dL (ref 8–27)
Bilirubin Total: 0.7 mg/dL (ref 0.0–1.2)
CO2: 26 mmol/L (ref 20–29)
Calcium: 10.3 mg/dL (ref 8.7–10.3)
Chloride: 102 mmol/L (ref 96–106)
Creatinine, Ser: 1.72 mg/dL — ABNORMAL HIGH (ref 0.57–1.00)
GFR calc Af Amer: 34 mL/min/{1.73_m2} — ABNORMAL LOW (ref >59)
GFR calc non Af Amer: 30 mL/min/{1.73_m2} — ABNORMAL LOW (ref >59)
Globulin, Total: 2.7 g/dL (ref 1.5–4.5)
Glucose: 102 mg/dL — ABNORMAL HIGH (ref 65–99)
Potassium: 4 mmol/L (ref 3.5–5.2)
Sodium: 145 mmol/L — ABNORMAL HIGH (ref 134–144)
Total Protein: 7.2 g/dL (ref 6.0–8.5)

## 2018-10-08 MED ORDER — METOPROLOL TARTRATE 25 MG PO TABS: 37.5000 mg | ORAL_TABLET | Freq: Two times a day (BID) | ORAL | 3 refills | Status: DC

## 2018-10-08 NOTE — Patient Instructions (Signed)
Medication Instructions:  Dr Royann Shiversroitoru has recommended making the following medication changes: 1. INCREASE Metoprolol 37.5 mg twice daily  Your physician has requested that you regularly monitor your blood pressure at home. Please use the same machine to check your blood pressure daily. Keep a record of your blood pressures using the log sheet provided. In 2 weeks, please report your readings back to Dr C. You may use our online patient portal 'MyChart' or you can call the office to speak with a nurse.  If you need a refill on your cardiac medications before your next appointment, please call your pharmacy.   Lab work: Your physician recommends that you return for lab work TODAY.  If you have labs (blood work) drawn today and your tests are completely normal, you will receive your results only by: Marland Kitchen. MyChart Message (if you have MyChart) OR . A paper copy in the mail If you have any lab test that is abnormal or we need to change your treatment, we will call you to review the results.  Follow-Up: At [facility], you and your health needs are our priority.  As part of our continuing mission to provide you with exceptional heart care, we have created designated Provider Care Teams.  These Care Teams include your primary Cardiologist (physician) and Advanced Practice Providers (APPs -  Physician Assistants and Nurse Practitioners) who all work together to provide you with the care you need, when you need it. You will need a follow up appointment in 12 months.  Please call our office 2 months in advance to schedule this appointment.  You may see Thurmon FairMihai Croitoru, MD or one of the following Advanced Practice Providers on your designated Care Team: [place] M[place], [place] . Micah FlesherAngela Duke, PA-C

## 2018-10-08 NOTE — Progress Notes (Signed)
Cardiology Office Note    Date:  10/08/2018   ID:  Christine Hogan, DOB Nov 15, 1945, MRN [id number]  PCP:  Christine Hogan, Christine T, MD  Cardiologist:   Christine FairMihai Veona Bittman, MD   Chief Complaint  Patient presents with  . Loss of Consciousness  . Hypertension    History of Present Illness:  Christine Hogan is a 72 y.o. female here to follow-up on atrial fibrillation and syncope.  Her father Christine Hogan was my patient in the past (He passed away with intracranial hemorrhage in 2017, age 70).  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  Her father had frequent syncope throughout his life as well.  She also has hypertension, hyperlipidemia, left ventricular hypertrophy with grade 1 diastolic dysfunction moderate to severely dilated left atrium, 2+ mitral regurgitation.  She had a normal stress test in 2014.  [name] has a history of hypertension and hyperlipidemia, normal LVEF with grade 1 diastolic dysfunction, moderate to severely dilated left atrium, 2+ MR by previous echo 2014, normal stress test in 2014.  Asymptomatic, rate control atrial fibrillation was detected during the event monitor follow-up after syncopal event in 2017.  She has had only one full syncopal event since her last appointment.  This happened several months ago shortly after she "got up too fast from couch".  She did not injure herself.  In addition she has episodes with similar prodrome (weak, flushed, sweaty) that she can abort by sitting down and using a cold washcloth on her forehead.  The patient specifically denies any chest pain at rest or exertion, dyspnea at rest or with exertion, orthopnea, paroxysmal nocturnal dyspnea, palpitations, focal neurological deficits, intermittent claudication, lower extremity edema, unexplained weight gain, cough, hemoptysis or wheezing.  She has not had any serious injuries or bleeding problems.  She is compliant with  anticoagulation.  She takes a statin and labs are followed by Dr. Tanya Hogan.  She reports that at home her blood pressures typically around 150/90.  It is never low.    Past Medical History:  Diagnosis Date  . Arthritis   . Atrial fibrillation (HCC)   . Chest pain    a. 2014: NST with no evidence of ischemia  . Diastolic dysfunction    grade 2 with dyspnea on exertion  . H/O echocardiogram    a. 2014: echo showing EF of 65-70% with Grade 1 DD, moderate TR, and mild to moderate MR  . Hyperlipidemia   . Hypertension   . Osteopenia     Past Surgical History:  Procedure Laterality Date  . ABDOMINAL HYSTERECTOMY     TAH/BSO (menorrhagia)  . bunion repair    . HERNIA REPAIR      Current Medications: Outpatient Medications Prior to Visit  Medication Sig Dispense Refill  . amLODipine (NORVASC) 10 MG tablet TAKE 1 TABLET BY MOUTH ONCE DAILY 90 tablet 2  . apixaban (ELIQUIS) 5 MG TABS tablet Take 1 tablet (5 mg total) by mouth 2 (two) times daily. 180 tablet 3  . hydrochlorothiazide (HYDRODIURIL) 25 MG tablet TAKE 1 TABLET BY MOUTH ONCE DAILY 90 tablet 1  . losartan (COZAAR) 100 MG tablet TAKE 1 TABLET BY MOUTH ONCE DAILY 90 tablet 3  . rosuvastatin (CRESTOR) 10 MG tablet Take 1 tablet (10 mg total) by mouth daily. Needs office visit and labs before further refills 90 tablet 0  . metoprolol tartrate (LOPRESSOR) 25 MG tablet TAKE  1 TABLET BY MOUTH TWICE DAILY 60 tablet 3  . nitroGLYCERIN (NITROSTAT) 0.4 MG SL tablet Place 1 tablet (0.4 mg total) under the tongue every 5 (five) minutes as needed for chest pain. (Patient not taking: Reported on 10/08/2018) 20 tablet 0  . clotrimazole-betamethasone (LOTRISONE) cream Apply 1 application topically 2 (two) times daily. 30 g 0  . fish oil-omega-3 fatty acids 1000 MG capsule Take 2 capsules (2 g total) by mouth daily. 60 capsule 11   No facility-administered medications prior to visit.      Allergies:   Codeine   Social History    Socioeconomic History  . Marital status: Divorced    Spouse name: Not on file  . Number of children: Not on file  . Years of education: Not on file  . Highest education level: Not on file  Occupational History  . Not on file  Social Needs  . Financial resource strain: Not on file  . Food insecurity:    Worry: Not on file    Inability: Not on file  . Transportation needs:    Medical: Not on file    Non-medical: Not on file  Tobacco Use  . Smoking status: Never Smoker  . Smokeless tobacco: Never Used  Substance and Sexual Activity  . Alcohol use: Yes    Alcohol/week: 5.0 - 6.0 standard drinks    Types: 5 - 6 Standard drinks or equivalent per week  . Drug use: No  . Sexual activity: Yes  Lifestyle  . Physical activity:    Days per week: Not on file    Minutes per session: Not on file  . Stress: Not on file  Relationships  . Social connections:    Talks on phone: Not on file    Gets together: Not on file    Attends religious service: Not on file    Active member of club or organization: Not on file    Attends meetings of clubs or organizations: Not on file    Relationship status: Not on file  Other Topics Concern  . Not on file  Social History Narrative  . Not on file     Family History:  The patient's family history includes Diabetes in her sister and sister; Heart disease in her father; Hypertension in her daughter, daughter, father, sister, and sister.   ROS:   Please see the history of present illness.    ROS all the other systems reviewed and are negative   PHYSICAL EXAM:   VS:  BP 112/70   Pulse 72   Ht 5' 5.5" (1.664 m)   Wt 157 lb 3.2 oz (71.3 kg)   BMI 25.76 kg/m     General: Alert, oriented x3, no distress, appears well Head: no evidence of trauma, PERRL, EOMI, no exophtalmos or lid lag, no myxedema, no xanthelasma; normal ears, nose and oropharynx Neck: normal jugular venous pulsations and no hepatojugular reflux; brisk carotid pulses without  delay and no carotid bruits Chest: clear to auscultation, no signs of consolidation by percussion or palpation, normal fremitus, symmetrical and full respiratory excursions Cardiovascular: normal position and quality of the apical impulse, regular rhythm, normal first and second heart sounds, no murmurs, rubs or gallops Abdomen: no tenderness or distention, no masses by palpation, no abnormal pulsatility or arterial bruits, normal bowel sounds, no hepatosplenomegaly Extremities: no clubbing, cyanosis or edema; 2+ radial, ulnar and brachial pulses bilaterally; 2+ right femoral, posterior tibial and dorsalis pedis pulses; 2+ left femoral, posterior tibial and  dorsalis pedis pulses; no subclavian or femoral bruits Neurological: grossly nonfocal Psych: Normal mood and affect  Wt Readings from Last 3 Encounters:  10/08/18 157 lb 3.2 oz (71.3 kg)  12/24/17 162 lb (73.5 kg)  07/06/17 160 lb (72.6 kg)      Studies/Labs Reviewed:   EKG:  EKG is ordered today.  It shows sinus rhythm with a QS pattern in lead V1-V2, no repolarization abnormalities, QTC 431 ms  Recent Labs: 12/24/2017: ALT 17; BUN 17; Creat 1.65; Hemoglobin 11.9; Platelets 233; Potassium 3.8; Sodium 142   Lipid Panel    Component Value Date/Time   CHOL 247 (H) 12/24/2017 1017   CHOL 191 07/10/2013 0832   TRIG 248 (H) 12/24/2017 1017   TRIG 210 (H) 07/10/2013 0832   HDL 48 (L) 12/24/2017 1017   HDL 52 07/10/2013 0832   CHOLHDL 5.1 (H) 12/24/2017 1017   VLDL 50 (H) 05/07/2014 0852   LDLCALC 158 (H) 12/24/2017 1017   LDLCALC 97 07/10/2013 0832    Additional studies/ records that were reviewed today include:  Notes from Dr. Tanya Nones  ASSESSMENT:    1. Paroxysmal atrial fibrillation (HCC)   2. Essential hypertension   3. Nonrheumatic mitral (valve) insufficiency   4. Vasovagal syncope   5. Long term current use of anticoagulant   6. Mixed hyperlipidemia   7. Medication management      PLAN:  In order of problems  listed above:  1. AFib: Asymptomatic, discovered incidentally, well rate controlled on metoprolol. CHADSVasc 3 (age, gender, HTN) .  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  Antiarrhythmic therapy is however not indicated since she is completely asymptomatic. 2. HTN: Her blood pressure is consistently elevated and she has evidence of endorgan damage (LVH, left atrial dilation), will increase the metoprolol to 37.5 mg twice daily.  Ideally will use more beta-blocker unless diuretic if this might help with her episodes of syncope. 3. Mild/moderate MR: Inaudible on exam, asymptomatic 4. Syncope: Clinically, this had all the hallmarks of a neurally mediated syncope event, rather than arrhythmia. Avoid dehydration, extreme heat, protracted orthostasis with moving, heed prodromal symptoms immediately and lay down if they occur.  The overall burden of syncope is quite low, only once in the last year.  Prefer the use of beta-blockers over diuretics. 5. Eliquis: Well-tolerated without bleeding complications. 6. HLP: LDL 158, started on rosuvastatin earlier this year.  Also has hypertriglyceridemia.  Recheck labs today.     Medication Adjustments/Labs and Tests Ordered: Current medicines are reviewed at length with the patient today.  Concerns regarding medicines are outlined above.  Medication changes, Labs and Tests ordered today are listed in the Patient Instructions below. Patient Instructions  Medication Instructions:  Dr Royann Shivers has recommended making the following medication changes: 1. INCREASE Metoprolol 37.5 mg twice daily  Your physician has requested that you regularly monitor your blood pressure at home. Please use the same machine to check your blood pressure daily. Keep a record of your blood pressures using the log sheet provided. In 2 weeks, please report your readings back to Dr C. You  may use our online patient portal 'MyChart' or you can call the office to speak with a nurse.  If you need a refill on your cardiac medications before your next appointment, please call your pharmacy.   Lab work: Your physician recommends that you return for lab work TODAY.  If you have labs (blood  work) drawn today and your tests are completely normal, you will receive your results only by: Marland Kitchen MyChart Message (if you have MyChart) OR . A paper copy in the mail If you have any lab test that is abnormal or we need to change your treatment, we will call you to review the results.  Follow-Up: At [facility], you and your health needs are our priority.  As part of our continuing mission to provide you with exceptional heart care, we have created designated Provider Care Teams.  These Care Teams include your primary Cardiologist (physician) and Advanced Practice Providers (APPs -  Physician Assistants and Nurse Practitioners) who all work together to provide you with the care you need, when you need it. You will need a follow up appointment in 12 months.  Please call our office 2 months in advance to schedule this appointment.  You may see Christine Fair, MD or one of the following Advanced Practice Providers on your designated Care Team: [place], [place] . Micah Flesher, PA-C    Signed, Christine Fair, MD  10/08/2018 2:18 PM    [facility] [address] [place], [place], [place]  [address] Phone: [phone]; Fax: [phone]

## 2018-12-02 ENCOUNTER — Other Ambulatory Visit: Payer: Self-pay | Admitting: Family Medicine

## 2018-12-10 ENCOUNTER — Ambulatory Visit (HOSPITAL_COMMUNITY)
Admission: EM | Admit: 2018-12-10 | Discharge: 2018-12-10 | Disposition: A | Discharge status: Home / Self Care | Payer: Medicare HMO | Attending: Family Medicine | Admitting: Family Medicine

## 2018-12-10 ENCOUNTER — Encounter (HOSPITAL_COMMUNITY): Payer: Self-pay

## 2018-12-10 DIAGNOSIS — R1011 Right upper quadrant pain: Secondary | ICD-10-CM

## 2018-12-10 LAB — POCT URINALYSIS DIP (DEVICE)
Bilirubin Urine: NEGATIVE
Glucose, UA: NEGATIVE mg/dL
Ketones, ur: NEGATIVE mg/dL
Leukocytes, UA: NEGATIVE
Nitrite: NEGATIVE
Protein, ur: 100 mg/dL — AB
Specific Gravity, Urine: 1.015 (ref 1.005–1.030)
Urobilinogen, UA: 0.2 mg/dL (ref 0.0–1.0)
pH: 5.5 (ref 5.0–8.0)

## 2018-12-10 LAB — COMPREHENSIVE METABOLIC PANEL
ALT: 21 U/L (ref 0–44)
AST: 28 U/L (ref 15–41)
Albumin: 4.4 g/dL (ref 3.5–5.0)
Alkaline Phosphatase: 60 U/L (ref 38–126)
Anion gap: 14 (ref 5–15)
BUN: 23 mg/dL (ref 8–23)
CO2: 24 mmol/L (ref 22–32)
Calcium: 9.9 mg/dL (ref 8.9–10.3)
Chloride: 99 mmol/L (ref 98–111)
Creatinine, Ser: 1.62 mg/dL — ABNORMAL HIGH (ref 0.44–1.00)
GFR calc Af Amer: 37 mL/min — ABNORMAL LOW (ref >60)
GFR calc non Af Amer: 32 mL/min — ABNORMAL LOW (ref >60)
Glucose, Bld: 114 mg/dL — ABNORMAL HIGH (ref 70–99)
Potassium: 3 mmol/L — ABNORMAL LOW (ref 3.5–5.1)
Sodium: 137 mmol/L (ref 135–145)
Total Bilirubin: 1.3 mg/dL — ABNORMAL HIGH (ref 0.3–1.2)
Total Protein: 7.8 g/dL (ref 6.5–8.1)

## 2018-12-10 LAB — CBC
HCT: 36.4 % (ref 36.0–46.0)
Hemoglobin: 11.4 g/dL — ABNORMAL LOW (ref 12.0–15.0)
MCH: 27.7 pg (ref 26.0–34.0)
MCHC: 31.3 g/dL (ref 30.0–36.0)
MCV: 88.6 fL (ref 80.0–100.0)
Platelets: 179 10*3/uL (ref 150–400)
RBC: 4.11 MIL/uL (ref 3.87–5.11)
RDW: 14 % (ref 11.5–15.5)
WBC: 7.8 10*3/uL (ref 4.0–10.5)
nRBC: 0 % (ref 0.0–0.2)

## 2018-12-10 LAB — LIPASE, BLOOD: Lipase: 27 U/L (ref 11–51)

## 2018-12-10 MED ORDER — DICYCLOMINE HCL 20 MG PO TABS: 20.0000 mg | ORAL_TABLET | Freq: Two times a day (BID) | ORAL | 0 refills | Status: DC

## 2018-12-10 MED ORDER — ONDANSETRON 4 MG PO TBDP: 4.0000 mg | ORAL_TABLET | Freq: Three times a day (TID) | ORAL | 0 refills | Status: DC | PRN

## 2018-12-10 NOTE — ED Triage Notes (Signed)
Pt presents with generalized abdominal pain with some nausea. 

## 2018-12-10 NOTE — Discharge Instructions (Addendum)
Your EKG looked similar to previous EKGs Urine with some blood, but no signs of infection Blood pressure dropped slightly with moving from lying to sitting- please be sure to drink plenty of fluids Zofran as needed for nausea Try 1 tablet of bentyl to see if this helps with stomach discomfort I will call you if blood work abnormal suggesting being evaluation in emergency room  If pain or dizziness worsening over next 24-48 hours please go to the emergency room

## 2018-12-11 NOTE — ED Provider Notes (Signed)
[facility]    CSN: [id number] Arrival date & time: 12/10/18  1828     History   Chief Complaint Chief Complaint  Patient presents with  . Abdominal Pain    HPI Arloa KohJoan C Mohiuddin is a 73 y.o. female history of A. fib, hypertension, osteopenia, presenting today for evaluation of abdominal pain and nausea.  Patient states that earlier this morning she felt hot and lightheaded.  Afterwards she developed some discomfort in her upper abdomen that is been associated with nausea.  She denies any associated vomiting or diarrhea.  Pain is been persistent since around 12 PM.  She denies any fevers.  She has not attempted to eat or drink anything since.  She denies any associated chest pain or shortness of breath, but she does continue to feel slightly dizzy.  Describes dizziness more as slightly lightheaded.  Notices this more when changing positions.  Denies vision changes.  Denies weakness.  Denies any urinary symptoms of dysuria or increased frequency.  Denies history of kidney stones.  HPI  Past Medical History:  Diagnosis Date  . Arthritis   . Atrial fibrillation (HCC)   . Chest pain    a. 2014: NST with no evidence of ischemia  . Diastolic dysfunction    grade 2 with dyspnea on exertion  . H/O echocardiogram    a. 2014: echo showing EF of 65-70% with Grade 1 DD, moderate TR, and mild to moderate MR  . Hyperlipidemia   . Hypertension   . Osteopenia     Patient Active Problem List   Diagnosis Date Noted  . Nonrheumatic mitral (valve) insufficiency 10/08/2018  . Vasovagal syncope 10/08/2018  . Osteopenia   . Atrial fibrillation (HCC)   . Diastolic dysfunction   . Paroxysmal atrial fibrillation (HCC) 02/09/2017  . Long term current use of anticoagulant 02/09/2017  . Chronic renal insufficiency 06/28/2013  . Chest pain 06/27/2013  . Essential hypertension   . Mixed hyperlipidemia     Past Surgical History:  Procedure Laterality Date  . ABDOMINAL HYSTERECTOMY     TAH/BSO (menorrhagia)  . bunion repair    . HERNIA REPAIR      OB History   No obstetric history on file.      Home Medications    Prior to Admission medications   Medication Sig Start Date End Date Taking? Authorizing Provider  amLODipine (NORVASC) 10 MG tablet TAKE 1 TABLET BY MOUTH ONCE DAILY 06/17/18   Donita BrooksPickard, Warren T, MD  apixaban (ELIQUIS) 5 MG TABS tablet Take 1 tablet (5 mg total) by mouth 2 (two) times daily. 04/11/18   Croitoru, Mihai, MD  dicyclomine (BENTYL) 20 MG tablet Take 1 tablet (20 mg total) by mouth 2 (two) times daily. 12/10/18   Wieters, Hallie C, PA-C  hydrochlorothiazide (HYDRODIURIL) 25 MG tablet TAKE 1 TABLET BY MOUTH ONCE DAILY 12/02/18   Donita BrooksPickard, Warren T, MD  losartan (COZAAR) 100 MG tablet TAKE 1 TABLET BY MOUTH ONCE DAILY 08/27/18   Donita BrooksPickard, Warren T, MD  metoprolol tartrate (LOPRESSOR) 25 MG tablet Take 1.5 tablets (37.5 mg total) by mouth 2 (two) times daily. 10/08/18   Croitoru, Mihai, MD  nitroGLYCERIN (NITROSTAT) 0.4 MG SL tablet Place 1 tablet (0.4 mg total) under the tongue every 5 (five) minutes as needed for chest pain. Patient not taking: Reported on 10/08/2018 12/11/13   Salley Scarleturham, Kawanta F, MD  ondansetron (ZOFRAN ODT) 4 MG disintegrating tablet Take 1 tablet (4 mg total) by mouth every 8 (eight) hours  as needed for nausea or vomiting. 12/10/18   Wieters, Hallie C, PA-C  rosuvastatin (CRESTOR) 10 MG tablet Take 1 tablet (10 mg total) by mouth daily. Needs office visit and labs before further refills 08/09/18   Donita Brooks, MD    Family History Family History  Problem Relation Age of Onset  . Hypertension Father   . Heart disease Father        pacemaker- followedby Dr. Royann Shivers  . Diabetes Sister   . Hypertension Sister   . Diabetes Sister   . Hypertension Sister   . Hypertension Daughter   . Hypertension Daughter     Social History Social History   Tobacco Use  . Smoking status: Never Smoker  . Smokeless tobacco: Never Used  Substance  Use Topics  . Alcohol use: Yes    Alcohol/week: 5.0 - 6.0 standard drinks    Types: 5 - 6 Standard drinks or equivalent per week  . Drug use: No     Allergies   Codeine   Review of Systems Review of Systems  Constitutional: Negative for fatigue and fever.  HENT: Negative for congestion, sinus pressure and sore throat.   Eyes: Negative for photophobia, pain and visual disturbance.  Respiratory: Negative for cough and shortness of breath.   Cardiovascular: Negative for chest pain.  Gastrointestinal: Positive for abdominal pain and nausea. Negative for vomiting.  Genitourinary: Negative for decreased urine volume and hematuria.  Musculoskeletal: Negative for myalgias, neck pain and neck stiffness.  Neurological: Positive for dizziness, light-headedness and headaches. Negative for syncope, facial asymmetry, speech difficulty, weakness and numbness.     Physical Exam Triage Vital Signs ED Triage Vitals  Enc Vitals Group     BP 12/10/18 1924 (!) 165/79     Pulse Rate 12/10/18 1924 82     Resp 12/10/18 1924 20     Temp 12/10/18 1924 98.3 F (36.8 C)     Temp Source 12/10/18 1924 Oral     SpO2 12/10/18 1924 99 %     Weight --      Height --      Head Circumference --      Peak Flow --      Pain Score 12/10/18 1925 10     Pain Loc --      Pain Edu? --      Excl. in GC? --    Orthostatic VS for the past 24 hrs:  BP- Lying Pulse- Lying BP- Sitting Pulse- Sitting  12/10/18 2005 162/76 72 149/76 79    Updated Vital Signs BP (!) 165/79 (BP Location: Right Arm)   Pulse 82   Temp 98.3 F (36.8 C) (Oral)   Resp 20   SpO2 99%   Visual Acuity Right Eye Distance:   Left Eye Distance:   Bilateral Distance:    Right Eye Near:   Left Eye Near:    Bilateral Near:     Physical Exam Vitals signs and nursing note reviewed.  Constitutional:      General: She is not in acute distress.    Appearance: She is well-developed.  HENT:     Head: Normocephalic and atraumatic.      Mouth/Throat:     Comments: Oral mucosa pink and moist, no tonsillar enlargement or exudate. Posterior pharynx patent and nonerythematous, no uvula deviation or swelling. Normal phonation. Eyes:     Extraocular Movements: Extraocular movements intact.     Conjunctiva/sclera: Conjunctivae normal.     Pupils: Pupils are equal,  round, and reactive to light.     Comments: Wearing glasses  Neck:     Musculoskeletal: Neck supple.  Cardiovascular:     Rate and Rhythm: Normal rate and regular rhythm.     Heart sounds: No murmur.  Pulmonary:     Effort: Pulmonary effort is normal. No respiratory distress.     Breath sounds: Normal breath sounds.     Comments: Breathing comfortably at rest, CTABL, no wheezing, rales or other adventitious sounds auscultated Abdominal:     Palpations: Abdomen is soft.     Tenderness: There is abdominal tenderness.     Comments: Abdomen soft, nondistended, significant tenderness throughout abdomen, more tender to right upper quadrant and epigastrium, negative McBurney's, weakly positive Murphy's, negative rebound  Skin:    General: Skin is warm and dry.  Neurological:     General: No focal deficit present.     Mental Status: She is alert and oriented to person, place, and time. Mental status is at baseline.     Comments: Patient A&O x3, cranial nerves II-XII grossly intact, strength at shoulders, hips and knees 5/5, equal bilaterally, Gait without abnormality.      UC Treatments / Results  Labs (all labs ordered are listed, but only abnormal results are displayed) Labs Reviewed  CBC - Abnormal; Notable for the following components:      Result Value   Hemoglobin 11.4 (*)    All other components within normal limits  COMPREHENSIVE METABOLIC PANEL - Abnormal; Notable for the following components:   Potassium 3.0 (*)    Glucose, Bld 114 (*)    Creatinine, Ser 1.62 (*)    Total Bilirubin 1.3 (*)    GFR calc non Af Amer 32 (*)    GFR calc Af Amer 37 (*)      All other components within normal limits  POCT URINALYSIS DIP (DEVICE) - Abnormal; Notable for the following components:   Hgb urine dipstick MODERATE (*)    Protein, ur 100 (*)    All other components within normal limits  LIPASE, BLOOD    EKG None  Radiology No results found.  Procedures Procedures (including critical care time)  Medications Ordered in UC Medications - No data to display  Initial Impression / Assessment and Plan / UC Course  I have reviewed the triage vital signs and the nursing notes.  Pertinent labs & imaging results that were available during my care of the patient were reviewed by me and considered in my medical decision making (see chart for details).     Urine with negative leuks and nitrites, does have moderate hemoglobin.  Kidney stone possible, but seems less likely given patient without back pain and without history.  EKG normal sinus rhythm, similar to previous EKG, no acute signs of ischemia or infarction.  Negative orthostatics, but blood pressure did drop slightly with moving from lying to sitting.  Discussed with patient going to emergency room for further imaging and evaluation of abdominal pain given tenderness versus monitoring over next 24 to 48 hours with blood work obtained here.  Patient opted for blood work.  Obtain CBC, CMP and lipase, will call patient with results, if suggestive of pancreatitis or gallbladder pathology will recommend going to ED.  Discussed if symptoms worsening overnight during the next 24 hours to go to emergency room.  Zofran as needed for nausea.  Did provide Bentyl, advised if she is able to cut this in half to take half tablet initially given her  age and this being an antihistamine.  Follow-up with primary care.Discussed strict return precautions. Patient verbalized understanding and is agreeable with plan.  Final Clinical Impressions(s) / UC Diagnoses   Final diagnoses:  Right upper quadrant abdominal pain      Discharge Instructions     Your EKG looked similar to previous EKGs Urine with some blood, but no signs of infection Blood pressure dropped slightly with moving from lying to sitting- please be sure to drink plenty of fluids Zofran as needed for nausea Try 1 tablet of bentyl to see if this helps with stomach discomfort I will call you if blood work abnormal suggesting being evaluation in emergency room  If pain or dizziness worsening over next 24-48 hours please go to the emergency room    ED Prescriptions    Medication Sig Dispense Auth. Provider   ondansetron (ZOFRAN ODT) 4 MG disintegrating tablet Take 1 tablet (4 mg total) by mouth every 8 (eight) hours as needed for nausea or vomiting. 20 tablet Wieters, Hallie C, PA-C   dicyclomine (BENTYL) 20 MG tablet Take 1 tablet (20 mg total) by mouth 2 (two) times daily. 20 tablet Wieters, SherwoodHallie C, PA-C     Controlled Substance Prescriptions [place] Controlled Substance Registry consulted? Not Applicable   Lew DawesWieters, Hallie C, [place] 12/11/18 1112

## 2019-01-18 ENCOUNTER — Other Ambulatory Visit: Payer: Self-pay | Admitting: Family Medicine

## 2019-04-24 ENCOUNTER — Ambulatory Visit (INDEPENDENT_AMBULATORY_CARE_PROVIDER_SITE_OTHER): Payer: Medicare HMO | Admitting: Family Medicine

## 2019-04-24 ENCOUNTER — Other Ambulatory Visit: Payer: Self-pay

## 2019-04-24 ENCOUNTER — Encounter: Payer: Self-pay | Admitting: Family Medicine

## 2019-04-24 VITALS — BP 160/80 | HR 58 | Temp 98.5°F | Resp 14 | Ht 65.5 in | Wt 156.0 lb

## 2019-04-24 DIAGNOSIS — R55 Syncope and collapse: Secondary | ICD-10-CM | POA: Diagnosis not present

## 2019-04-24 DIAGNOSIS — I10 Essential (primary) hypertension: Secondary | ICD-10-CM

## 2019-04-24 DIAGNOSIS — I480 Paroxysmal atrial fibrillation: Secondary | ICD-10-CM | POA: Diagnosis not present

## 2019-04-24 DIAGNOSIS — R7309 Other abnormal glucose: Secondary | ICD-10-CM | POA: Diagnosis not present

## 2019-04-24 DIAGNOSIS — R300 Dysuria: Secondary | ICD-10-CM | POA: Diagnosis not present

## 2019-04-24 LAB — MICROSCOPIC MESSAGE

## 2019-04-24 LAB — URINALYSIS, ROUTINE W REFLEX MICROSCOPIC
Bilirubin Urine: NEGATIVE
Glucose, UA: NEGATIVE
Hyaline Cast: NONE SEEN /LPF
Leukocytes,Ua: NEGATIVE
Nitrite: NEGATIVE
Specific Gravity, Urine: 1.03 (ref 1.001–1.03)
WBC, UA: NONE SEEN /HPF (ref 0–5)
pH: 5.5 (ref 5.0–8.0)

## 2019-04-24 MED ORDER — LOSARTAN POTASSIUM 100 MG PO TABS: 100.0000 mg | ORAL_TABLET | Freq: Every day | ORAL | 3 refills | Status: DC

## 2019-04-24 MED ORDER — ROSUVASTATIN CALCIUM 10 MG PO TABS: ORAL_TABLET | ORAL | 3 refills | Status: DC

## 2019-04-24 MED ORDER — CEPHALEXIN 500 MG PO CAPS: 500.0000 mg | ORAL_CAPSULE | Freq: Three times a day (TID) | ORAL | 0 refills | Status: DC

## 2019-04-24 MED ORDER — AMLODIPINE BESYLATE 10 MG PO TABS: 10.0000 mg | ORAL_TABLET | Freq: Every day | ORAL | 2 refills | Status: DC

## 2019-04-24 MED ORDER — HYDROCHLOROTHIAZIDE 25 MG PO TABS: 25.0000 mg | ORAL_TABLET | Freq: Every day | ORAL | 0 refills | Status: DC

## 2019-04-24 NOTE — Progress Notes (Signed)
Subjective:    Patient ID: Christine Hogan, female    DOB: 10-May-1946, 73 y.o.   MRN: [id number]  HPI Patient is here today because we would not refill her medication until she had an office visit.  Since I last saw the patient she was diagnosed with paroxysmal atrial fibrillation after we referred her to cardiology for near syncopal episodes.  She is currently on a combination of metoprolol and Eliquis.  She denies any bleeding or bruising.  However she did recently have an episode of syncope.  She states that she felt extremely hot and warm.  She lost consciousness and began to shake all over.  This was witnessed by her sister.  Symptoms resolved after just a minute or so.  She states that this happens occasionally.  There is a family history of seizures in her father.  She felt extremely lightheaded and dizzy prior to the episode.  She did not check her heart rate or her blood pressure.  However she denied any chest pain shortness of breath or palpitations when it occurred.  Her blood pressure today is elevated at 160/80 however she states at home is usually 140/90 Past Medical History:  Diagnosis Date  . Arthritis   . Atrial fibrillation (HCC)   . Chest pain    a. 2014: NST with no evidence of ischemia  . Diastolic dysfunction    grade 2 with dyspnea on exertion  . H/O echocardiogram    a. 2014: echo showing EF of 65-70% with Grade 1 DD, moderate TR, and mild to moderate MR  . Hyperlipidemia   . Hypertension   . Osteopenia    Past Surgical History:  Procedure Laterality Date  . ABDOMINAL HYSTERECTOMY     TAH/BSO (menorrhagia)  . bunion repair    . HERNIA REPAIR     Current Outpatient Medications on File Prior to Visit  Medication Sig Dispense Refill  . apixaban (ELIQUIS) 5 MG TABS tablet Take 1 tablet (5 mg total) by mouth 2 (two) times daily. 180 tablet 3  . dicyclomine (BENTYL) 20 MG tablet Take 1 tablet (20 mg total) by mouth 2 (two) times daily. 20 tablet 0  . metoprolol  tartrate (LOPRESSOR) 25 MG tablet Take 1.5 tablets (37.5 mg total) by mouth 2 (two) times daily. 270 tablet 3  . nitroGLYCERIN (NITROSTAT) 0.4 MG SL tablet Place 1 tablet (0.4 mg total) under the tongue every 5 (five) minutes as needed for chest pain. 20 tablet 0  . ondansetron (ZOFRAN ODT) 4 MG disintegrating tablet Take 1 tablet (4 mg total) by mouth every 8 (eight) hours as needed for nausea or vomiting. 20 tablet 0   No current facility-administered medications on file prior to visit.    Allergies  Allergen Reactions  . Codeine Nausea And Vomiting    dizziness   Social History   Socioeconomic History  . Marital status: Divorced    Spouse name: Not on file  . Number of children: Not on file  . Years of education: Not on file  . Highest education level: Not on file  Occupational History  . Not on file  Social Needs  . Financial resource strain: Not on file  . Food insecurity    Worry: Not on file    Inability: Not on file  . Transportation needs    Medical: Not on file    Non-medical: Not on file  Tobacco Use  . Smoking status: Never Smoker  . Smokeless tobacco: Never Used  Substance and Sexual Activity  . Alcohol use: Yes    Alcohol/week: 5.0 - 6.0 standard drinks    Types: 5 - 6 Standard drinks or equivalent per week  . Drug use: No  . Sexual activity: Yes  Lifestyle  . Physical activity    Days per week: Not on file    Minutes per session: Not on file  . Stress: Not on file  Relationships  . Social Musicianconnections    Talks on phone: Not on file    Gets together: Not on file    Attends religious service: Not on file    Active member of club or organization: Not on file    Attends meetings of clubs or organizations: Not on file    Relationship status: Not on file  . Intimate partner violence    Fear of current or ex partner: Not on file    Emotionally abused: Not on file    Physically abused: Not on file    Forced sexual activity: Not on file  Other Topics  Concern  . Not on file  Social History Narrative  . Not on file   Family History  Problem Relation Age of Onset  . Hypertension Father   . Heart disease Father        pacemaker- followedby Dr. Royann Shiverscroitoru  . Diabetes Sister   . Hypertension Sister   . Diabetes Sister   . Hypertension Sister   . Hypertension Daughter   . Hypertension Daughter      Review of Systems  All other systems reviewed and are negative.      Objective:   Physical Exam  Constitutional: She is oriented to person, place, and time. She appears well-developed and well-nourished. No distress.  HENT:  Head: Normocephalic and atraumatic.  Right Ear: External ear normal.  Left Ear: External ear normal.  Nose: Nose normal.  Mouth/Throat: Oropharynx is clear and moist. No oropharyngeal exudate.  Eyes: Pupils are equal, round, and reactive to light. EOM are normal. Right eye exhibits no discharge. Left eye exhibits no discharge. No scleral icterus.  Neck: Normal range of motion. Neck supple. No JVD present. No tracheal deviation present. No thyromegaly present.  Cardiovascular: Normal rate, regular rhythm, normal heart sounds and intact distal pulses. Exam reveals no gallop and no friction rub.  No murmur heard. Pulmonary/Chest: Effort normal and breath sounds normal. No stridor. No respiratory distress. She has no wheezes. She has no rales. She exhibits no tenderness.  Abdominal: Soft. Bowel sounds are normal. She exhibits no distension and no mass. There is no abdominal tenderness. There is no rebound and no guarding.  Musculoskeletal: Normal range of motion.        General: No tenderness or edema.  Lymphadenopathy:    She has no cervical adenopathy.  Neurological: She is alert and oriented to person, place, and time. She has normal reflexes. No cranial nerve deficit. She exhibits normal muscle tone. Coordination normal.  Skin: Skin is warm. No rash noted. She is not diaphoretic. No erythema. No pallor.   Psychiatric: She has a normal mood and affect. Her behavior is normal. Judgment and thought content normal.  Vitals reviewed.         Assessment & Plan:  The primary encounter diagnosis was Dysuria. Diagnoses of Benign essential HTN, Near syncope, and Paroxysmal atrial fibrillation (HCC) were also pertinent to this visit. I will check a CBC, and CMP today to measure the patient's electrolytes.  I am concerned about her near  syncopal episode.  Is possible it was vasovagal with subsequent seizure activity afterwards.  However I will refer the patient to neurology to evaluate for possible underlying seizure disorder as a cause of her syncope.  I also recommended a cardiology consultation for monitor placement if appropriate to rule out cardiac arrhythmias given her history of atrial fibrillation as a cause of her syncope.  Blood pressure is elevated however I will defer treatment of this until after we have determine if there is in fact a cardiac arrhythmia causing hypotension and decreased cerebral perfusion leading to her near syncopal episode. She also reports increased urinary frequency and mild dysuria with urinary hesitancy.  Urinalysis is unremarkable today but symptoms are consistent with a urinary tract infection.  I will try the patient on Keflex 500 3 times daily for 5 days and then reassess

## 2019-04-28 ENCOUNTER — Other Ambulatory Visit: Payer: Self-pay | Admitting: *Deleted

## 2019-04-28 MED ORDER — LOSARTAN POTASSIUM 50 MG PO TABS: 100.0000 mg | ORAL_TABLET | Freq: Every day | ORAL | 1 refills | Status: DC

## 2019-04-29 LAB — COMPLETE METABOLIC PANEL WITH GFR
AG Ratio: 1.6 (calc) (ref 1.0–2.5)
ALT: 13 U/L (ref 6–29)
AST: 18 U/L (ref 10–35)
Albumin: 4.4 g/dL (ref 3.6–5.1)
Alkaline phosphatase (APISO): 57 U/L (ref 37–153)
BUN/Creatinine Ratio: 9 (calc) (ref 6–22)
BUN: 14 mg/dL (ref 7–25)
CO2: 26 mmol/L (ref 20–32)
Calcium: 10 mg/dL (ref 8.6–10.4)
Chloride: 108 mmol/L (ref 98–110)
Creat: 1.52 mg/dL — ABNORMAL HIGH (ref 0.60–0.93)
GFR, Est African American: 40 mL/min/{1.73_m2} — ABNORMAL LOW (ref >=60)
GFR, Est Non African American: 34 mL/min/{1.73_m2} — ABNORMAL LOW (ref >=60)
Globulin: 2.7 g/dL (calc) (ref 1.9–3.7)
Glucose, Bld: 122 mg/dL — ABNORMAL HIGH (ref 65–99)
Potassium: 4 mmol/L (ref 3.5–5.3)
Sodium: 143 mmol/L (ref 135–146)
Total Bilirubin: 0.6 mg/dL (ref 0.2–1.2)
Total Protein: 7.1 g/dL (ref 6.1–8.1)

## 2019-04-29 LAB — CBC WITH DIFFERENTIAL/PLATELET
Absolute Monocytes: 407 cells/uL (ref 200–950)
Basophils Absolute: 19 cells/uL (ref 0–200)
Basophils Relative: 0.5 %
Eosinophils Absolute: 103 cells/uL (ref 15–500)
Eosinophils Relative: 2.7 %
HCT: 33.9 % — ABNORMAL LOW (ref 35.0–45.0)
Hemoglobin: 11.2 g/dL — ABNORMAL LOW (ref 11.7–15.5)
Lymphs Abs: 2079 cells/uL (ref 850–3900)
MCH: 28.9 pg (ref 27.0–33.0)
MCHC: 33 g/dL (ref 32.0–36.0)
MCV: 87.4 fL (ref 80.0–100.0)
MPV: 10.5 fL (ref 7.5–12.5)
Monocytes Relative: 10.7 %
Neutro Abs: 1193 cells/uL — ABNORMAL LOW (ref 1500–7800)
Neutrophils Relative %: 31.4 %
Platelets: 217 10*3/uL (ref 140–400)
RBC: 3.88 10*6/uL (ref 3.80–5.10)
RDW: 13.9 % (ref 11.0–15.0)
Total Lymphocyte: 54.7 %
WBC: 3.8 10*3/uL (ref 3.8–10.8)

## 2019-04-29 LAB — TEST AUTHORIZATION

## 2019-04-29 LAB — HEMOGLOBIN A1C
Hgb A1c MFr Bld: 5.4 % of total Hgb (ref <5.7)
Mean Plasma Glucose: 108 (calc)
eAG (mmol/L): 6 (calc)

## 2019-05-15 ENCOUNTER — Telehealth: Payer: Self-pay | Admitting: Cardiovascular Disease

## 2019-05-15 NOTE — Telephone Encounter (Signed)

## 2019-05-16 ENCOUNTER — Other Ambulatory Visit: Payer: Self-pay

## 2019-05-16 ENCOUNTER — Telehealth: Payer: Self-pay | Admitting: Radiology

## 2019-05-16 ENCOUNTER — Ambulatory Visit (INDEPENDENT_AMBULATORY_CARE_PROVIDER_SITE_OTHER): Payer: Medicare HMO | Admitting: Cardiovascular Disease

## 2019-05-16 ENCOUNTER — Encounter: Payer: Self-pay | Admitting: Cardiovascular Disease

## 2019-05-16 VITALS — BP 141/78 | HR 69 | Ht 65.5 in | Wt 155.0 lb

## 2019-05-16 DIAGNOSIS — I480 Paroxysmal atrial fibrillation: Secondary | ICD-10-CM | POA: Diagnosis not present

## 2019-05-16 DIAGNOSIS — N183 Chronic kidney disease, stage 3 unspecified: Secondary | ICD-10-CM

## 2019-05-16 DIAGNOSIS — I10 Essential (primary) hypertension: Secondary | ICD-10-CM

## 2019-05-16 DIAGNOSIS — E782 Mixed hyperlipidemia: Secondary | ICD-10-CM | POA: Diagnosis not present

## 2019-05-16 DIAGNOSIS — I340 Nonrheumatic mitral (valve) insufficiency: Secondary | ICD-10-CM | POA: Diagnosis not present

## 2019-05-16 DIAGNOSIS — R55 Syncope and collapse: Secondary | ICD-10-CM | POA: Diagnosis not present

## 2019-05-16 DIAGNOSIS — Z7901 Long term (current) use of anticoagulants: Secondary | ICD-10-CM | POA: Diagnosis not present

## 2019-05-16 MED ORDER — HYDROCHLOROTHIAZIDE 12.5 MG PO TABS: 12.5000 mg | ORAL_TABLET | Freq: Every day | ORAL | 3 refills | Status: DC

## 2019-05-16 MED ORDER — METOPROLOL TARTRATE 50 MG PO TABS: 50.0000 mg | ORAL_TABLET | Freq: Two times a day (BID) | ORAL | 3 refills | Status: DC

## 2019-05-16 NOTE — Telephone Encounter (Signed)
Enrolled patient for a 14 day [employer] Event monitor to be mailed. Brief instructions were gone over and she knows to expect the monitor to arrive in 3-4 days.

## 2019-05-16 NOTE — Progress Notes (Signed)
Cardiology Office Note    Date:  05/16/2019   ID:  [name], DOB [birth year], MRN [id number]  PCP:  Susy Frizzle, MD  Cardiologist:   Sanda Klein, MD   Chief Complaint  Patient presents with  . Loss of Consciousness    History of Present Illness:  Christine Hogan is a 73 y.o. female referred back by Dr. Terence Lux after experiencing a syncopal event.  She has a history of previous syncope going back to adolescence with a pattern suggestive of neurally mediated events.  She also has systemic hypertension and incidentally discovered rate controlled paroxysmal atrial fibrillation seen on event monitor in 2017.  Her father Christine Hogan was my patient in the past (He passed away with intracranial hemorrhage in 2017, age 71).  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  Her father had frequent syncope throughout his life as well.  She also has hypertension, hyperlipidemia, left ventricular hypertrophy with grade 1 diastolic dysfunction moderate to severely dilated left atrium, 2+ mitral regurgitation.  She had a normal stress test in 2014.  She was walking at the [employer] on a hot day when she began experiencing diaphoresis, weakness and spots in her vision and felt suddenly extremely tired.  She had been wearing her mask and believes this was constricting her breathing.  Her sister told her that she lost consciousness and was shaking all over and she was worried she was having a seizure.  Last week she had a near syncopal event, diaphoresis and spots in her vision when walking from her yard back to the house, but was able to immediately abort syncope by sitting down.  The patient specifically denies any chest pain at rest exertion, dyspnea at rest or with exertion, orthopnea, paroxysmal nocturnal dyspnea,  palpitations, focal neurological deficits, intermittent claudication, lower extremity edema, unexplained  weight gain, cough, hemoptysis or wheezing.  She has not had any serious falls, injuries or bleeding complications from the anticoagulant.  She is aware of the need for social distancing and wears a mask whenever outside her home.  She does not have any signs or symptoms of coronavirus infection.   Past Medical History:  Diagnosis Date  . Arthritis   . Atrial fibrillation ([place])   . Chest pain    a. 2014: NST with no evidence of ischemia  . Diastolic dysfunction    grade 2 with dyspnea on exertion  . H/O echocardiogram    a. 2014: echo showing EF of 65-70% with Grade 1 DD, moderate TR, and mild to moderate MR  . Hyperlipidemia   . Hypertension   . Osteopenia     Past Surgical History:  Procedure Laterality Date  . ABDOMINAL HYSTERECTOMY     TAH/BSO (menorrhagia)  . bunion repair    . HERNIA REPAIR      Current Medications: Outpatient Medications Prior to Visit  Medication Sig Dispense Refill  . amLODipine (NORVASC) 10 MG tablet Take 1 tablet (10 mg total) by mouth daily. 90 tablet 2  . apixaban (ELIQUIS) 5 MG TABS tablet Take 1 tablet (5 mg total) by mouth 2 (two) times daily. 180 tablet 3  . cephALEXin (KEFLEX) 500 MG capsule Take 1 capsule (500 mg total) by mouth 3 (three) times daily. 15 capsule 0  . dicyclomine (BENTYL) 20 MG tablet Take 1 tablet (20 mg total) by mouth 2 (two) times daily. 20 tablet 0  .  losartan (COZAAR) 50 MG tablet Take 2 tablets (100 mg total) by mouth daily. 180 tablet 1  . nitroGLYCERIN (NITROSTAT) 0.4 MG SL tablet Place 1 tablet (0.4 mg total) under the tongue every 5 (five) minutes as needed for chest pain. 20 tablet 0  . ondansetron (ZOFRAN ODT) 4 MG disintegrating tablet Take 1 tablet (4 mg total) by mouth every 8 (eight) hours as needed for nausea or vomiting. 20 tablet 0  . rosuvastatin (CRESTOR) 10 MG tablet TAKE 1 TABLET BY MOUTH ONCE DAILY 90 tablet 3  . hydrochlorothiazide (HYDRODIURIL) 25 MG tablet Take 1 tablet (25 mg total) by mouth daily.  90 tablet 0  . metoprolol tartrate (LOPRESSOR) 25 MG tablet Take 1.5 tablets (37.5 mg total) by mouth 2 (two) times daily. 270 tablet 3   No facility-administered medications prior to visit.      Allergies:   Codeine   Social History   Socioeconomic History  . Marital status: Divorced    Spouse name: Not on file  . Number of children: Not on file  . Years of education: Not on file  . Highest education level: Not on file  Occupational History  . Not on file  Social Needs  . Financial resource strain: Not on file  . Food insecurity    Worry: Not on file    Inability: Not on file  . Transportation needs    Medical: Not on file    Non-medical: Not on file  Tobacco Use  . Smoking status: Never Smoker  . Smokeless tobacco: Never Used  Substance and Sexual Activity  . Alcohol use: Yes    Alcohol/week: 5.0 - 6.0 standard drinks    Types: 5 - 6 Standard drinks or equivalent per week  . Drug use: No  . Sexual activity: Yes  Lifestyle  . Physical activity    Days per week: Not on file    Minutes per session: Not on file  . Stress: Not on file  Relationships  . Social Musicianconnections    Talks on phone: Not on file    Gets together: Not on file    Attends religious service: Not on file    Active member of club or organization: Not on file    Attends meetings of clubs or organizations: Not on file    Relationship status: Not on file  Other Topics Concern  . Not on file  Social History Narrative  . Not on file     Family History:  The patient's family history includes Diabetes in her sister and sister; Heart disease in her father; Hypertension in her daughter, daughter, father, sister, and sister.   ROS:   Please see the history of present illness.    ROS all other systems are reviewed and are negative    PHYSICAL EXAM:   VS:  BP (!) 141/78   Pulse 69   Ht 5' 5.5" (1.664 m)   Wt 155 lb (70.3 kg)   SpO2 99%   BMI 25.40 kg/m      General: Alert, oriented x3, no  distress, appears fit and younger than her stated age Head: no evidence of trauma, PERRL, EOMI, no exophtalmos or lid lag, no myxedema, no xanthelasma; normal ears, nose and oropharynx Neck: normal jugular venous pulsations and no hepatojugular reflux; brisk carotid pulses without delay and no carotid bruits Chest: clear to auscultation, no signs of consolidation by percussion or palpation, normal fremitus, symmetrical and full respiratory excursions Cardiovascular: normal position and quality  of the apical impulse, regular rhythm, normal first and second heart sounds, 2/6 holosystolic apical murmur radiating towards the left parasternal area, no diastolic murmurs, rubs or gallops Abdomen: no tenderness or distention, no masses by palpation, no abnormal pulsatility or arterial bruits, normal bowel sounds, no hepatosplenomegaly Extremities: no clubbing, cyanosis or edema; 2+ radial, ulnar and brachial pulses bilaterally; 2+ right femoral, posterior tibial and dorsalis pedis pulses; 2+ left femoral, posterior tibial and dorsalis pedis pulses; no subclavian or femoral bruits Neurological: grossly nonfocal Psych: Normal mood and affect   Wt Readings from Last 3 Encounters:  05/16/19 155 lb (70.3 kg)  04/24/19 156 lb (70.8 kg)  10/08/18 157 lb 3.2 oz (71.3 kg)      Studies/Labs Reviewed:   EKG:  EKG is not ordered today.  The ECG from 12/10/2018 shows sinus rhythm and a QS pattern in leads V1-V2 consistent with a septal infarction, but there are no repolarization abnormalities.  The tracing is identical with previous months.  Recent Labs: 04/24/2019: ALT 13; BUN 14; Creat 1.52; Hemoglobin 11.2; Platelets 217; Potassium 4.0; Sodium 143   Lipid Panel    Component Value Date/Time   CHOL 148 10/08/2018 1140   CHOL 191 07/10/2013 0832   TRIG 175 (H) 10/08/2018 1140   TRIG 210 (H) 07/10/2013 0832   HDL 57 10/08/2018 1140   HDL 52 07/10/2013 0832   CHOLHDL 2.6 10/08/2018 1140   CHOLHDL 5.1 (H)  12/24/2017 1017   VLDL 50 (H) 05/07/2014 0852   LDLCALC 56 10/08/2018 1140   LDLCALC 158 (H) 12/24/2017 1017   LDLCALC 97 07/10/2013 0832    Additional studies/ records that were reviewed today include:  Notes from Dr. Tanya NonesPickard  ASSESSMENT:    1. Syncope, unspecified syncope type      PLAN:  In order of problems listed above:  1. Syncope: The pattern is quite consistent with vasovagal syncope.  She has a fairly lengthy prodrome and has learned what to do to avoid passing out, but was probably unable to find a spot to lay down while [place] [employer] and allowed the hemodynamic changes to progress to follow.  It is reasonable to repeat an event monitor, since she does have risk factors for complex arrhythmia and it will help us see the progress of her atrial fibrillation as well.  Reminded her about the importance of staying very well-hydrated, avoiding prolonged orthostasis without moving, avoiding the heat.  Most importantly she should immediately pay heed to the prodromal symptoms and lie down to prevent full syncope.  We will decrease the hydrochlorothiazide to 12.5 milligrams daily and increase the metoprolol to 50 mg twice daily. 2. AFib: Asymptomatic and discovered incidentally on event monitor.  Asymptomatic, discovered incidentally and was well rate controlled on metoprolol. CHADSVasc 3 (age, gender, HTN) .  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  Antiarrhythmic therapy is however not indicated since she is completely asymptomatic. 3. HTN: We will increase the metoprolol to 50 mg twice daily and decrease the hydrochlorothiazide to lessen the likelihood of syncopal events. 4. Mild/moderate MR:  murmur is more distinct at this time, but she remains asymptomatic. 5. Eliquis: No history of stroke or TIA.  No bleeding complications. 6. HLP: Marked improvement in cholesterol  parameters on statin. 7. CKD: stable renal function.     Medication Adjustments/Labs and Tests Ordered: Current medicines are reviewed at length with the patient  today.  Concerns regarding medicines are outlined above.  Medication changes, Labs and Tests ordered today are listed in the Patient Instructions below. Patient Instructions  Medication Instructions:  DECREASE the Hydrochlorothiazide to 12.5 mg once daily INCREASE the Metoprolol Tartrate to 50 mg twice daily  If you need a refill on your cardiac medications before your next appointment, please call your pharmacy.   Lab work: None ordered  Testing/Procedures: Your physician has recommended that you wear an 14 day event monitor. Event monitors are medical devices that record the heart's electrical activity. Doctors most often us these monitors to diagnose arrhythmias. Arrhythmias are problems with the speed or rhythm of the heartbeat. The monitor is a small, portable device. You can wear one while you do your normal daily activities. This is usually used to diagnose what is causing palpitations/syncope (passing out). Someone from the device clinic at the [facility] office will reach out to you.  Follow-Up: At [facility], you and your health needs are our priority.  As part of our continuing mission to provide you with exceptional heart care, we have created designated Provider Care Teams.  These Care Teams include your primary Cardiologist (physician) and Advanced Practice Providers (APPs -  Physician Assistants and Nurse Practitioners) who all work together to provide you with the care you need, when you need it. You will need a follow up appointment in 12 months.  Please call our office 2 months in advance to schedule this appointment.  You may see Thurmon FairMihai Shelvy Perazzo, MD or one of the following Advanced Practice Providers on your designated Care Team: [place] M[place], [place]-C . Micah FlesherAngela Duke, PA-C       Signed, Thurmon FairMihai Mcdonald Reiling, MD   05/16/2019 8:36 AM    [facility] [address], [place], [place]  [address] Phone: [phone]; Fax: [phone]

## 2019-05-16 NOTE — Patient Instructions (Signed)
Medication Instructions:  DECREASE the Hydrochlorothiazide to 12.5 mg once daily INCREASE the Metoprolol Tartrate to 50 mg twice daily  If you need a refill on your cardiac medications before your next appointment, please call your pharmacy.   Lab work: None ordered  Testing/Procedures: Your physician has recommended that you wear an 14 day event monitor. Event monitors are medical devices that record the heart's electrical activity. Doctors most often [place] these monitors to diagnose arrhythmias. Arrhythmias are problems with the speed or rhythm of the heartbeat. The monitor is a small, portable device. You can wear one while you do your normal daily activities. This is usually used to diagnose what is causing palpitations/syncope (passing out). Someone from the device clinic at the [facility] office will reach out to you.  Follow-Up: At [facility], you and your health needs are our priority.  As part of our continuing mission to provide you with exceptional heart care, we have created designated Provider Care Teams.  These Care Teams include your primary Cardiologist (physician) and Advanced Practice Providers (APPs -  Physician Assistants and Nurse Practitioners) who all work together to provide you with the care you need, when you need it. You will need a follow up appointment in 12 months.  Please call our office 2 months in advance to schedule this appointment.  You may see Sanda Klein, MD or one of the following Advanced Practice Providers on your designated Care Team: [place], [place] . Fabian Sharp, PA-C

## 2019-05-21 ENCOUNTER — Encounter (INDEPENDENT_AMBULATORY_CARE_PROVIDER_SITE_OTHER): Payer: Medicare HMO

## 2019-05-21 DIAGNOSIS — R55 Syncope and collapse: Secondary | ICD-10-CM | POA: Diagnosis not present

## 2019-05-26 ENCOUNTER — Encounter: Payer: Self-pay | Admitting: Neurology

## 2019-05-26 ENCOUNTER — Ambulatory Visit (INDEPENDENT_AMBULATORY_CARE_PROVIDER_SITE_OTHER): Payer: Medicare HMO | Admitting: Neurology

## 2019-05-26 ENCOUNTER — Other Ambulatory Visit: Payer: Self-pay

## 2019-05-26 VITALS — BP 132/68 | HR 60 | Temp 98.5°F | Ht 65.5 in | Wt 154.0 lb

## 2019-05-26 DIAGNOSIS — R55 Syncope and collapse: Secondary | ICD-10-CM | POA: Insufficient documentation

## 2019-05-26 DIAGNOSIS — I480 Paroxysmal atrial fibrillation: Secondary | ICD-10-CM

## 2019-05-26 NOTE — Progress Notes (Signed)
PATIENT: Christine Hogan DOB: 1946/04/21  Chief Complaint  Patient presents with  . Concerned about seizures    From PCP: [place] [employer] [place] when she began experiencing diaphoresis, weakness, spots in her vision and felt suddenly extremely tired.  She had been wearing her mask and believes this was constricting her breathing. Her sister told her that she lost consciousness and was shaking all over (she was worried about seizures). Last week she had a near syncopal event, diaphoresis, spots in her vision when walking from her yard back to the house. Avoided syncope by sitting down.  Marland Kitchen. PCP    Christine Hogan, Christine T, MD.  She is here with her daughter, Christine Hogan, today.     HISTORICAL  Christine Hogan is a 73 year old female, seen in request by her primary care physician Dr. Nils PyleWarren Hogan for evaluation of passing out spells, she is accompanied by her daughter Christine Hogan at today's visit on May 26, 2019  I have reviewed and summarized the referring note from the referring physician.  She had a past medical history of hypertension, hyperlipidemia, atrial fibrillation, is taking Eliquis.  She was diagnosed with paroxysmal atrial fibrillation in 2019, she presented with sudden onset dizziness, heavy sweat, feels like she is going to pass out, denies chest pain, heart palpitation at that time, diagnosis was confirmed by 30 days cardiac monitoring  She had 2 similar episode, in June 2020, while [place] [employer] with her sister, she suddenly feel really hot, sick on her stomach, break out into sweat, nauseous, she has to sit down, 15 minutes, as of consciousness  Second episode was on May 13, 2019, while walking, she felt lightheadedness, as if she is going to pass out, she has to sit down  Lab on April 30 2019, Glucose 122, creat 1.52, Hg 11.2  REVIEW OF SYSTEMS: Full 14 system review of systems performed and notable only for as above. All other review of systems  were negative.  ALLERGIES: Allergies  Allergen Reactions  . Codeine Nausea And Vomiting    dizziness    HOME MEDICATIONS: Current Outpatient Medications  Medication Sig Dispense Refill  . amLODipine (NORVASC) 10 MG tablet Take 1 tablet (10 mg total) by mouth daily. 90 tablet 2  . apixaban (ELIQUIS) 5 MG TABS tablet Take 1 tablet (5 mg total) by mouth 2 (two) times daily. 180 tablet 3  . hydrochlorothiazide (HYDRODIURIL) 12.5 MG tablet Take 1 tablet (12.5 mg total) by mouth daily. 90 tablet 3  . losartan (COZAAR) 50 MG tablet Take 2 tablets (100 mg total) by mouth daily. 180 tablet 1  . metoprolol tartrate (LOPRESSOR) 50 MG tablet Take 1 tablet (50 mg total) by mouth 2 (two) times daily. 180 tablet 3  . nitroGLYCERIN (NITROSTAT) 0.4 MG SL tablet Place 1 tablet (0.4 mg total) under the tongue every 5 (five) minutes as needed for chest pain. 20 tablet 0  . rosuvastatin (CRESTOR) 10 MG tablet TAKE 1 TABLET BY MOUTH ONCE DAILY 90 tablet 3   No current facility-administered medications for this visit.     PAST MEDICAL HISTORY: Past Medical History:  Diagnosis Date  . Arthritis   . Atrial fibrillation (HCC)   . Chest pain    a. 2014: NST with no evidence of ischemia  . Diastolic dysfunction    grade 2 with dyspnea on exertion  . H/O echocardiogram    a. 2014: echo showing EF of 65-70%  with Grade 1 DD, moderate TR, and mild to moderate MR  . Hyperlipidemia   . Hypertension   . Near syncope   . Osteopenia     PAST SURGICAL HISTORY: Past Surgical History:  Procedure Laterality Date  . ABDOMINAL HYSTERECTOMY     TAH/BSO (menorrhagia)  . bunion repair    . HERNIA REPAIR      FAMILY HISTORY: Family History  Problem Relation Age of Onset  . Hypertension Father   . Heart disease Father        pacemaker- followedby Dr. Sallyanne Kuster  . Diabetes Sister   . Hypertension Sister   . Diabetes Sister   . Hypertension Sister   . Hypertension Daughter   . Hypertension Daughter   .  Kidney failure Mother     SOCIAL HISTORY: Social History   Socioeconomic History  . Marital status: Divorced    Spouse name: Not on file  . Number of children: 5  . Years of education: 12th grade  . Highest education level: Not on file  Occupational History  . Occupation: Retired  Scientific laboratory technician  . Financial resource strain: Not on file  . Food insecurity    Worry: Not on file    Inability: Not on file  . Transportation needs    Medical: Not on file    Non-medical: Not on file  Tobacco Use  . Smoking status: Never Smoker  . Smokeless tobacco: Never Used  Substance and Sexual Activity  . Alcohol use: Yes    Comment: rare  . Drug use: No  . Sexual activity: Yes  Lifestyle  . Physical activity    Days per week: Not on file    Minutes per session: Not on file  . Stress: Not on file  Relationships  . Social Herbalist on phone: Not on file    Gets together: Not on file    Attends religious service: Not on file    Active member of club or organization: Not on file    Attends meetings of clubs or organizations: Not on file    Relationship status: Not on file  . Intimate partner violence    Fear of current or ex partner: Not on file    Emotionally abused: Not on file    Physically abused: Not on file    Forced sexual activity: Not on file  Other Topics Concern  . Not on file  Social History Narrative   Lives with her son.   Right-handed.   Four living children.   Occasional caffeine use.     PHYSICAL EXAM   Vitals:   05/26/19 1022  BP: 132/68  Pulse: 60  Temp: 98.5 F (36.9 C)  Weight: 154 lb (69.9 kg)  Height: 5' 5.5" (1.664 m)    Not recorded      Body mass index is 25.24 kg/m.  PHYSICAL EXAMNIATION:  Gen: NAD, conversant, well nourised, obese, well groomed                     Cardiovascular: Regular rate rhythm, no peripheral edema, warm, nontender. Eyes: Conjunctivae clear without exudates or hemorrhage Neck: Supple, no carotid  bruits. Pulmonary: Clear to auscultation bilaterally   NEUROLOGICAL EXAM:  MENTAL STATUS: Speech:    Speech is normal; fluent and spontaneous with normal comprehension.  Cognition:     Orientation to time, place and person     Normal recent and remote memory     Normal Attention  span and concentration     Normal Language, naming, repeating,spontaneous speech     Fund of knowledge   CRANIAL NERVES: CN II: Visual fields are full to confrontation.  Pupils are round equal and briskly reactive to light. CN III, IV, VI: extraocular movement are normal. No ptosis. CN V: Facial sensation is intact to pinprick in all 3 divisions bilaterally. Corneal responses are intact.  CN VII: Face is symmetric with normal eye closure and smile. CN VIII: Hearing is normal to rubbing fingers CN IX, X: Palate elevates symmetrically. Phonation is normal. CN XI: Head turning and shoulder shrug are intact CN XII: Tongue is midline with normal movements and no atrophy.  MOTOR: There is no pronator drift of out-stretched arms. Muscle bulk and tone are normal. Muscle strength is normal.  REFLEXES: Reflexes are 2+ and symmetric at the biceps, triceps, knees, and ankles. Plantar responses are flexor.  SENSORY: Intact to light touch, pinprick, positional sensation and vibratory sensation are intact in fingers and toes.  COORDINATION: Rapid alternating movements and fine finger movements are intact. There is no dysmetria on finger-to-nose and heel-knee-shin.    GAIT/STANCE: Posture is normal. Gait is steady with normal steps, base, arm swing, and turning. Heel and toe walking are normal. Tandem gait is normal.  Romberg is absent.   DIAGNOSTIC DATA (LABS, IMAGING, TESTING) - I reviewed patient records, labs, notes, testing and imaging myself where available.   ASSESSMENT AND PLAN  Christine Hogan is a 73 y.o. female   Pass out spells  Most likely syncope, remote possibility of seizure  Complete  evaluation with MRI brain w/wo, EEG  She is wearing cardiac monitoring  Levert FeinsteinYijun Cumi Sanagustin, M.D. Ph.D.  [facility] [address], [address] [place], [place] [address] Ph: [phone] Fax: [phone]  CC: Referring Provider

## 2019-05-27 ENCOUNTER — Telehealth: Payer: Self-pay | Admitting: Neurology

## 2019-05-27 ENCOUNTER — Telehealth: Payer: Self-pay | Admitting: *Deleted

## 2019-05-27 ENCOUNTER — Other Ambulatory Visit: Payer: Self-pay | Admitting: *Deleted

## 2019-05-27 MED ORDER — APIXABAN 5 MG PO TABS: 5.0000 mg | ORAL_TABLET | Freq: Two times a day (BID) | ORAL | 3 refills | Status: DC

## 2019-05-27 NOTE — Telephone Encounter (Signed)
Mcarthur Rossetti Josem Kaufmann: [phone] (exp. 05/27/19 to 06/26/19) order sent to [facility]. They will reach out to the patient to schedule.

## 2019-05-27 NOTE — Telephone Encounter (Signed)
[employer] pending faxed notes.  

## 2019-05-27 NOTE — Telephone Encounter (Signed)
Eliquis patient assistance papers have been faxed to [phone]

## 2019-05-28 NOTE — Telephone Encounter (Signed)
Follow Up   The patient assistance foundation calling in to have 2 pages of the patient portion to be re-faxed. States that the consent form needs to be dated and the insurance information (policy number) needs to be included. She states that pages that need to be resubmitted has been faxed to the office, they just need to be corrected and faxed back.   Fax # [phone]

## 2019-06-02 ENCOUNTER — Ambulatory Visit (INDEPENDENT_AMBULATORY_CARE_PROVIDER_SITE_OTHER): Payer: Medicare HMO | Admitting: Neurology

## 2019-06-02 ENCOUNTER — Other Ambulatory Visit: Payer: Self-pay

## 2019-06-02 DIAGNOSIS — I480 Paroxysmal atrial fibrillation: Secondary | ICD-10-CM

## 2019-06-02 DIAGNOSIS — R55 Syncope and collapse: Secondary | ICD-10-CM

## 2019-06-09 NOTE — Telephone Encounter (Signed)
Forms have been refaxed  

## 2019-06-14 NOTE — Procedures (Signed)
   HISTORY: 73 years old female, had a recurrent episode of confusion, dizziness, difficulty breathing    TECHNIQUE:  This is a routine 16 channel EEG recording with one channel devoted to a limited EKG recording.  It was performed during wakefulness, drowsiness and asleep.  Hyperventilation and photic stimulation were performed as activating procedures.  There are minimum muscle and movement artifact noted.  Upon maximum arousal, posterior dominant waking rhythm consistent of mildly dysarhythmic mixed theta and alpha range activity, with frequency of 7-8Hz . Activities are symmetric over the bilateral posterior derivations and attenuated with eye opening.  Hyperventilation produced mild/moderate buildup with higher amplitude and the slower activities noted.  Photic stimulation did not alter the tracing.  During EEG recording, patient developed drowsiness and no deeper stage of sleep was achieved.  During EEG recording, there was no epileptiform discharge noted.  EKG demonstrate sinus rhythm, with heart rate of 66 beats per minute.  CONCLUSION: This is a mild abnormal EEG.  There is mild background slowing, common etiology are metabolic toxic.  Marcial Pacas, M.D. Ph.D.  [facility] [place], [place] [address] Phone: [phone] Fax:      [phone]

## 2019-06-27 NOTE — Telephone Encounter (Signed)
Assistance has been approved from 06/11/2019-11/06/2019

## 2019-08-15 ENCOUNTER — Ambulatory Visit (INDEPENDENT_AMBULATORY_CARE_PROVIDER_SITE_OTHER): Payer: Medicare HMO | Admitting: Family Medicine

## 2019-08-15 DIAGNOSIS — J069 Acute upper respiratory infection, unspecified: Secondary | ICD-10-CM

## 2019-08-15 MED ORDER — HYDROCODONE-HOMATROPINE 5-1.5 MG/5ML PO SYRP: 5.0000 mL | ORAL_SOLUTION | Freq: Three times a day (TID) | ORAL | 0 refills | Status: DC | PRN

## 2019-08-15 NOTE — Progress Notes (Signed)
Subjective:    Patient ID: Christine Hogan, female    DOB: Mar 15, 1946, 73 y.o.   MRN: [id number]  HPI  Patient is being seen today as a telephone visit.  Phone call began at 917.  Phone call concluded at 927.  Patient consents to be seen over the telephone.  She states that 2 weeks ago, she developed a cold.  Symptoms included rhinorrhea, postnasal drip, and a nonproductive cough.  She denies any chest pain, she denied any fever, she denied any shortness of breath.  She did not feel bad she was just congested.  All of the rhinorrhea and congestion has subsided however she continues to have a cough.  The cough is mild.  It is nonproductive.  She denies any purulent sputum.  She denies any pleurisy.  She denies any chest congestion.  She denies any fever or hemoptysis.  She describes it is more of an irritant cough that keeps her awake at night.  Despite taking cough medication the cough is lingering.  Overall she feels much better. Past Medical History:  Diagnosis Date  . Arthritis   . Atrial fibrillation ([place])   . Chest pain    a. 2014: NST with no evidence of ischemia  . Diastolic dysfunction    grade 2 with dyspnea on exertion  . H/O echocardiogram    a. 2014: echo showing EF of 65-70% with Grade 1 DD, moderate TR, and mild to moderate MR  . Hyperlipidemia   . Hypertension   . Near syncope   . Osteopenia    Past Surgical History:  Procedure Laterality Date  . ABDOMINAL HYSTERECTOMY     TAH/BSO (menorrhagia)  . bunion repair    . HERNIA REPAIR     Current Outpatient Medications on File Prior to Visit  Medication Sig Dispense Refill  . amLODipine (NORVASC) 10 MG tablet Take 1 tablet (10 mg total) by mouth daily. 90 tablet 2  . apixaban (ELIQUIS) 5 MG TABS tablet Take 1 tablet (5 mg total) by mouth 2 (two) times daily. 180 tablet 3  . hydrochlorothiazide (HYDRODIURIL) 12.5 MG tablet Take 1 tablet (12.5 mg total) by mouth daily. 90 tablet 3  . losartan (COZAAR) 50 MG tablet Take 2  tablets (100 mg total) by mouth daily. 180 tablet 1  . metoprolol tartrate (LOPRESSOR) 50 MG tablet Take 1 tablet (50 mg total) by mouth 2 (two) times daily. 180 tablet 3  . nitroGLYCERIN (NITROSTAT) 0.4 MG SL tablet Place 1 tablet (0.4 mg total) under the tongue every 5 (five) minutes as needed for chest pain. 20 tablet 0  . rosuvastatin (CRESTOR) 10 MG tablet TAKE 1 TABLET BY MOUTH ONCE DAILY 90 tablet 3   No current facility-administered medications on file prior to visit.    Allergies  Allergen Reactions  . Codeine Nausea And Vomiting    dizziness   Social History   Socioeconomic History  . Marital status: Divorced    Spouse name: Not on file  . Number of children: 5  . Years of education: 12th grade  . Highest education level: Not on file  Occupational History  . Occupation: Retired  Scientific laboratory technician  . Financial resource strain: Not on file  . Food insecurity    Worry: Not on file    Inability: Not on file  . Transportation needs    Medical: Not on file    Non-medical: Not on file  Tobacco Use  . Smoking status: Never Smoker  . Smokeless  tobacco: Never Used  Substance and Sexual Activity  . Alcohol use: Yes    Comment: rare  . Drug use: No  . Sexual activity: Yes  Lifestyle  . Physical activity    Days per week: Not on file    Minutes per session: Not on file  . Stress: Not on file  Relationships  . Social Musician on phone: Not on file    Gets together: Not on file    Attends religious service: Not on file    Active member of club or organization: Not on file    Attends meetings of clubs or organizations: Not on file    Relationship status: Not on file  . Intimate partner violence    Fear of current or ex partner: Not on file    Emotionally abused: Not on file    Physically abused: Not on file    Forced sexual activity: Not on file  Other Topics Concern  . Not on file  Social History Narrative   Lives with her son.   Right-handed.   Four  living children.   Occasional caffeine use.     Review of Systems  All other systems reviewed and are negative.      Objective:   Physical Exam  Patient is speaking full and complete sentences today over the telephone with no respiratory distress      Assessment & Plan:  URI, acute  Symptoms sound like the patient had a viral upper respiratory infection.  She has been longer than 2 weeks since symptom onset therefore even if this was COVID, the patient would likely no longer be contagious and testing would not change management.  Therefore I recommended treating her cough with Hycodan 1 teaspoon every 8 hours as needed for cough.  Reassess in 1 week if still persistent or sooner if worsening

## 2019-08-21 ENCOUNTER — Other Ambulatory Visit: Payer: Self-pay

## 2019-08-21 ENCOUNTER — Telehealth: Payer: Self-pay | Admitting: Family Medicine

## 2019-08-21 ENCOUNTER — Encounter (HOSPITAL_COMMUNITY): Payer: Self-pay

## 2019-08-21 ENCOUNTER — Ambulatory Visit (HOSPITAL_COMMUNITY)
Admission: EM | Admit: 2019-08-21 | Discharge: 2019-08-21 | Disposition: A | Discharge status: Home / Self Care | Payer: Medicare HMO | Attending: Family Medicine | Admitting: Family Medicine

## 2019-08-21 DIAGNOSIS — R109 Unspecified abdominal pain: Secondary | ICD-10-CM | POA: Diagnosis not present

## 2019-08-21 DIAGNOSIS — R110 Nausea: Secondary | ICD-10-CM | POA: Insufficient documentation

## 2019-08-21 DIAGNOSIS — F1010 Alcohol abuse, uncomplicated: Secondary | ICD-10-CM | POA: Diagnosis not present

## 2019-08-21 LAB — CBC WITH DIFFERENTIAL/PLATELET
Abs Immature Granulocytes: 0.02 10*3/uL (ref 0.00–0.07)
Basophils Absolute: 0 10*3/uL (ref 0.0–0.1)
Basophils Relative: 0 %
Eosinophils Absolute: 0 10*3/uL (ref 0.0–0.5)
Eosinophils Relative: 0 %
HCT: 42.2 % (ref 36.0–46.0)
Hemoglobin: 13.8 g/dL (ref 12.0–15.0)
Immature Granulocytes: 0 %
Lymphocytes Relative: 39 %
Lymphs Abs: 2.2 10*3/uL (ref 0.7–4.0)
MCH: 28.6 pg (ref 26.0–34.0)
MCHC: 32.7 g/dL (ref 30.0–36.0)
MCV: 87.4 fL (ref 80.0–100.0)
Monocytes Absolute: 0.6 10*3/uL (ref 0.1–1.0)
Monocytes Relative: 10 %
Neutro Abs: 2.8 10*3/uL (ref 1.7–7.7)
Neutrophils Relative %: 51 %
Platelets: 256 10*3/uL (ref 150–400)
RBC: 4.83 MIL/uL (ref 3.87–5.11)
RDW: 14.3 % (ref 11.5–15.5)
WBC: 5.6 10*3/uL (ref 4.0–10.5)
nRBC: 0 % (ref 0.0–0.2)

## 2019-08-21 LAB — POCT URINALYSIS DIP (DEVICE)
Glucose, UA: NEGATIVE mg/dL
Leukocytes,Ua: NEGATIVE
Nitrite: NEGATIVE
Protein, ur: >=300 mg/dL — AB
Specific Gravity, Urine: >=1.03 (ref 1.005–1.030)
Urobilinogen, UA: 1 mg/dL (ref 0.0–1.0)
pH: 5.5 (ref 5.0–8.0)

## 2019-08-21 LAB — LIPASE, BLOOD: Lipase: 35 U/L (ref 11–51)

## 2019-08-21 LAB — COMPREHENSIVE METABOLIC PANEL
ALT: 25 U/L (ref 0–44)
AST: 28 U/L (ref 15–41)
Albumin: 4.6 g/dL (ref 3.5–5.0)
Alkaline Phosphatase: 71 U/L (ref 38–126)
Anion gap: 13 (ref 5–15)
BUN: 14 mg/dL (ref 8–23)
CO2: 25 mmol/L (ref 22–32)
Calcium: 10.3 mg/dL (ref 8.9–10.3)
Chloride: 99 mmol/L (ref 98–111)
Creatinine, Ser: 1.77 mg/dL — ABNORMAL HIGH (ref 0.44–1.00)
GFR calc Af Amer: 33 mL/min — ABNORMAL LOW (ref >60)
GFR calc non Af Amer: 28 mL/min — ABNORMAL LOW (ref >60)
Glucose, Bld: 127 mg/dL — ABNORMAL HIGH (ref 70–99)
Potassium: 3.5 mmol/L (ref 3.5–5.1)
Sodium: 137 mmol/L (ref 135–145)
Total Bilirubin: 1.7 mg/dL — ABNORMAL HIGH (ref 0.3–1.2)
Total Protein: 8.4 g/dL — ABNORMAL HIGH (ref 6.5–8.1)

## 2019-08-21 LAB — FOLATE: Folate: 59.7 ng/mL (ref >5.9)

## 2019-08-21 LAB — AMYLASE: Amylase: 115 U/L — ABNORMAL HIGH (ref 28–100)

## 2019-08-21 MED ORDER — ONDANSETRON 4 MG PO TBDP
ORAL_TABLET | ORAL | Status: AC
  Filled 2019-08-21: qty 1

## 2019-08-21 MED ORDER — SODIUM CHLORIDE 0.9 % IV BOLUS
1000.0000 mL | Freq: Once | INTRAVENOUS | Status: AC
  Administered 2019-08-21: 1000 mL via INTRAVENOUS

## 2019-08-21 MED ORDER — ONDANSETRON 4 MG PO TBDP: 4.0000 mg | ORAL_TABLET | Freq: Three times a day (TID) | ORAL | 0 refills | Status: DC | PRN

## 2019-08-21 MED ORDER — ONDANSETRON 4 MG PO TBDP
4.0000 mg | ORAL_TABLET | Freq: Once | ORAL | Status: AC
  Administered 2019-08-21: 4 mg via ORAL

## 2019-08-21 NOTE — ED Triage Notes (Signed)
Patient presents to Urgent Care with complaints of abdominal pain and feeling like she has a hangover since drinking an excessive amount of alcohol over the weekend. Patient reports she called EMS yesterday and was told to try and rehydrate because her electrolytes were probably low. Pt unable to see PCP until next week so was told to come here.

## 2019-08-21 NOTE — Discharge Instructions (Addendum)
You have been seen today for abdominal pain. Some of your symptoms may be related to sudden cessation of alcohol use. Your evaluation was not suggestive of any emergent condition requiring medical intervention at this time. However, some abdominal problems make take more time to appear. Therefore, it is very important for you to pay attention to any new symptoms or worsening of your current condition.  Call 911 or go the Emergency Department immediately should you begin to feel worse in any way or have any of the following symptoms: increasing or different abdominal pain, persistent vomiting, inability to drink fluids, fevers, shaking chills, or seizures.  Please do your best to ensure adequate fluid intake in order to avoid dehydration. I have sent a nausea medicine to your pharmacy should you need this.

## 2019-08-21 NOTE — ED Provider Notes (Signed)
[facility]   [id number] 08/21/19 Arrival Time: 1115  ASSESSMENT & PLAN:  1. Abdominal discomfort   2. Alcohol abuse   3. Nausea without vomiting     Benign abdominal exam. No indications for urgent abdominal/pelvic imaging at this time. Discussed. Reports feeling better after IVF and Zofran. Is not having active alcohol withdrawal symptoms at this time. She does desire to quit abusing alcohol. Plans to discuss with PCP.  Meds ordered this encounter  Medications  . sodium chloride 0.9 % bolus 1,000 mL  . ondansetron (ZOFRAN-ODT) disintegrating tablet 4 mg  . ondansetron (ZOFRAN-ODT) 4 MG disintegrating tablet    Sig: Take 1 tablet (4 mg total) by mouth every 8 (eight) hours as needed for nausea or vomiting.    Dispense:  15 tablet    Refill:  0     Discharge Instructions     You have been seen today for abdominal pain. Some of your symptoms may be related to sudden cessation of alcohol use. Your evaluation was not suggestive of any emergent condition requiring medical intervention at this time. However, some abdominal problems make take more time to appear. Therefore, it is very important for you to pay attention to any new symptoms or worsening of your current condition.  Call 911 or go the Emergency Department immediately should you begin to feel worse in any way or have any of the following symptoms: increasing or different abdominal pain, persistent vomiting, inability to drink fluids, fevers, shaking chills, or seizures.  Please do your best to ensure adequate fluid intake in order to avoid dehydration. I have sent a nausea medicine to your pharmacy should you need this.   Follow-up Information    Donita Brooks, MD.   Specialty: Family Medicine Why: Keep your follow up appointment on 08/25/2019. Contact information: [address] [place] [address] [phone]        [facility] EMERGENCY DEPARTMENT.   Specialty:  Emergency Medicine Why: If symptoms worsen in any way. Contact information: [address] [id number] mc [place] [address] [phone]         Urine culture sent. Doubt acute pancreatitis. Cr 1.77 that is above her baseline; suspect pre-renal. Glucose elevation noted; non-fasting.  She has a f/u with PCP on 10/19. May recheck labs at that time. Discussed lab abnormalcies with her before discharge.  Reviewed expectations re: course of current medical issues. Questions answered. Outlined signs and symptoms indicating need for more acute intervention. Patient verbalized understanding. After Visit Summary given.   SUBJECTIVE: History from: patient. Christine Hogan is a 73 y.o. female who presents with complaint of intermittent generalized abdominal discomfort. Onset gradual, approx 3 days ago. Discomfort described as "kind of a full feeling"; without radiation; does not wake her at night. Symptoms are not present at this time; "maybe feeling a little better". Does report frequent and heavy alcohol use; a fifth of vodka will last her 2 days usually. Has had "one shot" two days ago, otherwise has not used alcohol. Does reports a few hours of shaking chills "and feeling sick to my stomach" a couple of days ago. This has passed. No seizure activity or hallucinations. Her son was staying with her and helping her. No recent illnesses. Fever: absent. Aggravating factors: have not been identified. Alleviating factors: have not been identified. Associated symptoms: fatigue. She denies arthralgias, belching, constipation, diarrhea, dysuria, myalgias and sweats. No flank or back pain. Appetite: decreased. PO intake: decreased.  Ambulatory without assistance. Urinary symptoms: none. Bowel movements: have not significantly changed; last bowel movement within the past 1-2 days and without blood. No illegal drug use.  OTC treatment: none reported.  No LMP recorded. Patient has had a  hysterectomy.   Past Surgical History:  Procedure Laterality Date  . ABDOMINAL HYSTERECTOMY     TAH/BSO (menorrhagia)  . bunion repair    . HERNIA REPAIR     Social History   Tobacco Use  Smoking Status Never Smoker  Smokeless Tobacco Never Used   ROS: As per HPI. All other systems negative.  OBJECTIVE:  Vitals:   08/21/19 1132  BP: 136/84  Pulse: 95  Resp: 16  Temp: 97.8 F (36.6 C)  TempSrc: Temporal  SpO2: 100%    General appearance: alert, oriented, no acute distress; does appear fatigued HEENT: [place]; AT; oropharynx dry Lungs: clear to auscultation bilaterally; unlabored respirations Heart: regular rate and rhythm Abdomen: soft; without distention; mild  and poorly localized tenderness to palpation over epigastric area; normal bowel sounds; without masses or organomegaly; without guarding or rebound tenderness; no bruising or skin discoloration over abdomen Back: without CVA tenderness; FROM at waist Extremities: without LE edema; symmetrical; without gross deformities Skin: warm and dry Neurologic: normal gait Psychological: alert and cooperative; normal mood and affect  Labs:  Labs Reviewed  COMPREHENSIVE METABOLIC PANEL - Abnormal; Notable for the following components:      Result Value   Glucose, Bld 127 (*)    Creatinine, Ser 1.77 (*)    Total Protein 8.4 (*)    Total Bilirubin 1.7 (*)    GFR calc non Af Amer 28 (*)    GFR calc Af Amer 33 (*)    All other components within normal limits  POCT URINALYSIS DIP (DEVICE) - Abnormal; Notable for the following components:   Bilirubin Urine SMALL (*)    Ketones, ur TRACE (*)    Hgb urine dipstick LARGE (*)    Protein, ur >=300 (*)    All other components within normal limits  URINE CULTURE  CBC WITH DIFFERENTIAL/PLATELET  LIPASE, BLOOD  AMYLASE  FOLATE     Allergies  Allergen Reactions  . Codeine Nausea And Vomiting    dizziness                                               Past Medical History:   Diagnosis Date  . Arthritis   . Atrial fibrillation (HCC)   . Chest pain    a. 2014: NST with no evidence of ischemia  . Diastolic dysfunction    grade 2 with dyspnea on exertion  . H/O echocardiogram    a. 2014: echo showing EF of 65-70% with Grade 1 DD, moderate TR, and mild to moderate MR  . Hyperlipidemia   . Hypertension   . Near syncope   . Osteopenia    Social History   Socioeconomic History  . Marital status: Divorced    Spouse name: Not on file  . Number of children: 5  . Years of education: 12th grade  . Highest education level: Not on file  Occupational History  . Occupation: Retired  Engineer, productionocial Needs  . Financial resource strain: Not on file  . Food insecurity    Worry: Not on file    Inability: Not on file  . Transportation needs  Medical: Not on file    Non-medical: Not on file  Tobacco Use  . Smoking status: Never Smoker  . Smokeless tobacco: Never Used  Substance and Sexual Activity  . Alcohol use: Yes  . Drug use: No  . Sexual activity: Yes  Lifestyle  . Physical activity    Days per week: Not on file    Minutes per session: Not on file  . Stress: Not on file  Relationships  . Social Herbalist on phone: Not on file    Gets together: Not on file    Attends religious service: Not on file    Active member of club or organization: Not on file    Attends meetings of clubs or organizations: Not on file    Relationship status: Not on file  . Intimate partner violence    Fear of current or ex partner: Not on file    Emotionally abused: Not on file    Physically abused: Not on file    Forced sexual activity: Not on file  Other Topics Concern  . Not on file  Social History Narrative   Lives with her son.   Right-handed.   Four living children.   Occasional caffeine use.   Family History  Problem Relation Age of Onset  . Hypertension Father   . Heart disease Father        pacemaker- followedby Dr. Sallyanne Kuster  . Diabetes Sister   .  Hypertension Sister   . Diabetes Sister   . Hypertension Sister   . Hypertension Daughter   . Hypertension Daughter   . Kidney failure Mother      Vanessa Kick, MD 08/21/19 [phone]

## 2019-08-21 NOTE — Telephone Encounter (Signed)
Patient called in stating that she has been experiencing abdominal pain to the center of her abdomen with nausea for the past three days. States that he stools have also been irregular and she has not been able to eat. Has had some tremors with abdominal pain. Patient called paramedics to her home on yesterday and states that her vitals were fine so she did not go to hospital. Patient also informed me that she just returned from a vacation at the beach and she drunk a lot of alcohol and symptoms started after drinking quite a bit. Advised patient to go to the UC/ER today and get evaluated today as we have no appointments. Patient verbalized understanding.

## 2019-08-22 LAB — URINE CULTURE: Culture: <10000 — AB

## 2019-08-25 ENCOUNTER — Encounter: Payer: Self-pay | Admitting: Family Medicine

## 2019-08-25 ENCOUNTER — Other Ambulatory Visit: Payer: Self-pay

## 2019-08-25 ENCOUNTER — Ambulatory Visit (INDEPENDENT_AMBULATORY_CARE_PROVIDER_SITE_OTHER): Payer: Medicare HMO | Admitting: Family Medicine

## 2019-08-25 VITALS — BP 130/70 | HR 58 | Temp 97.4°F | Resp 16 | Ht 65.5 in | Wt 157.0 lb

## 2019-08-25 DIAGNOSIS — F10930 Alcohol use, unspecified with withdrawal, uncomplicated: Secondary | ICD-10-CM

## 2019-08-25 DIAGNOSIS — R251 Tremor, unspecified: Secondary | ICD-10-CM

## 2019-08-25 DIAGNOSIS — D539 Nutritional anemia, unspecified: Secondary | ICD-10-CM | POA: Diagnosis not present

## 2019-08-25 DIAGNOSIS — F10230 Alcohol dependence with withdrawal, uncomplicated: Secondary | ICD-10-CM | POA: Diagnosis not present

## 2019-08-25 NOTE — Progress Notes (Signed)
Subjective:    Patient ID: Christine Hogan, female    DOB: [birth year], 73 y.o.   MRN: [id number]  HPI  Patient is a very pleasant 73 year old African-American female.  Recently she has been extremely sick.  She admits that she drinks a heavy amount of alcohol.  She states that she drinks approximately 1/5 of hard liquor every 1 to 2 days.  Recently she stopped drinking and became extremely sick.  She was nauseated.  She developed a tremor.  She was throwing up.  She denies any hallucinations however she felt extremely weak and lightheaded.  She went to an urgent care where the doctor checked a CBC, BMP, and urinalysis.  Doctor told her that he thought she was suffering from withdrawal.  She was given some IV fluids.  Today she feels much better.  He has been more than 1 week since her last drink.  She denies any tremor today.  She denies any hallucinations.  She denies any headache.  She denies any seizure activity or syncope.  She denies any abdominal pain.  However she states the last week has been very rough.  She states that she never wants to go through that again. Past Medical History:  Diagnosis Date  . Arthritis   . Atrial fibrillation (HCC)   . Chest pain    a. 2014: NST with no evidence of ischemia  . Diastolic dysfunction    grade 2 with dyspnea on exertion  . H/O echocardiogram    a. 2014: echo showing EF of 65-70% with Grade 1 DD, moderate TR, and mild to moderate MR  . Hyperlipidemia   . Hypertension   . Near syncope   . Osteopenia    Past Surgical History:  Procedure Laterality Date  . ABDOMINAL HYSTERECTOMY     TAH/BSO (menorrhagia)  . bunion repair    . HERNIA REPAIR     Current Outpatient Medications on File Prior to Visit  Medication Sig Dispense Refill  . amLODipine (NORVASC) 10 MG tablet Take 1 tablet (10 mg total) by mouth daily. 90 tablet 2  . apixaban (ELIQUIS) 5 MG TABS tablet Take 1 tablet (5 mg total) by mouth 2 (two) times daily. 180 tablet 3  .  hydrochlorothiazide (HYDRODIURIL) 12.5 MG tablet Take 1 tablet (12.5 mg total) by mouth daily. 90 tablet 3  . HYDROcodone-homatropine (HYCODAN) 5-1.5 MG/5ML syrup Take 5 mLs by mouth every 8 (eight) hours as needed for cough. 120 mL 0  . losartan (COZAAR) 100 MG tablet Take 100 mg by mouth daily.     . metoprolol tartrate (LOPRESSOR) 50 MG tablet Take 1 tablet (50 mg total) by mouth 2 (two) times daily. 180 tablet 3  . nitroGLYCERIN (NITROSTAT) 0.4 MG SL tablet Place 1 tablet (0.4 mg total) under the tongue every 5 (five) minutes as needed for chest pain. 20 tablet 0  . ondansetron (ZOFRAN-ODT) 4 MG disintegrating tablet Take 1 tablet (4 mg total) by mouth every 8 (eight) hours as needed for nausea or vomiting. 15 tablet 0  . rosuvastatin (CRESTOR) 10 MG tablet TAKE 1 TABLET BY MOUTH ONCE DAILY 90 tablet 3   No current facility-administered medications on file prior to visit.    Allergies  Allergen Reactions  . Codeine Nausea And Vomiting    dizziness   Social History   Socioeconomic History  . Marital status: Divorced    Spouse name: Not on file  . Number of children: 5  . Years of education: 12th  grade  . Highest education level: Not on file  Occupational History  . Occupation: Retired  Scientific laboratory technician  . Financial resource strain: Not on file  . Food insecurity    Worry: Not on file    Inability: Not on file  . Transportation needs    Medical: Not on file    Non-medical: Not on file  Tobacco Use  . Smoking status: Never Smoker  . Smokeless tobacco: Never Used  Substance and Sexual Activity  . Alcohol use: Yes  . Drug use: No  . Sexual activity: Yes  Lifestyle  . Physical activity    Days per week: Not on file    Minutes per session: Not on file  . Stress: Not on file  Relationships  . Social Herbalist on phone: Not on file    Gets together: Not on file    Attends religious service: Not on file    Active member of club or organization: Not on file     Attends meetings of clubs or organizations: Not on file    Relationship status: Not on file  . Intimate partner violence    Fear of current or ex partner: Not on file    Emotionally abused: Not on file    Physically abused: Not on file    Forced sexual activity: Not on file  Other Topics Concern  . Not on file  Social History Narrative   Lives with her son.   Right-handed.   Four living children.   Occasional caffeine use.     Review of Systems  All other systems reviewed and are negative.      Objective:   Physical Exam Vitals signs reviewed.  Constitutional:      General: She is not in acute distress.    Appearance: Normal appearance. She is normal weight. She is not ill-appearing, toxic-appearing or diaphoretic.  Eyes:     Extraocular Movements: Extraocular movements intact.     Pupils: Pupils are equal, round, and reactive to light.  Cardiovascular:     Rate and Rhythm: Normal rate and regular rhythm.     Pulses: Normal pulses.     Heart sounds: Normal heart sounds. No murmur. No friction rub. No gallop.   Pulmonary:     Effort: Pulmonary effort is normal. No respiratory distress.     Breath sounds: Normal breath sounds. No stridor. No wheezing, rhonchi or rales.  Abdominal:     General: Abdomen is flat. Bowel sounds are normal.     Palpations: Abdomen is soft.  Neurological:     General: No focal deficit present.     Mental Status: She is alert and oriented to person, place, and time. Mental status is at baseline.     Cranial Nerves: No cranial nerve deficit.     Sensory: No sensory deficit.     Motor: No weakness.     Coordination: Coordination normal.     Gait: Gait normal.     Deep Tendon Reflexes: Reflexes normal.  Psychiatric:        Mood and Affect: Mood normal.        Behavior: Behavior normal.        Thought Content: Thought content normal.        Judgment: Judgment normal.           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  However she is clinically back to her baseline today.  Her blood pressure is normal.  She is not tachycardic.  She has no tremor today on exam.  There is no neurologic deficit.  She is not hallucinating.  I will check a BMP to monitor her electrolytes.  Given her heavy alcohol consumption and history of neuropathy I will check a vitamin B12, vitamin B1, and a folic acid level.  Encouraged adequate hydration and abstinence from all alcohol in the future

## 2019-08-26 ENCOUNTER — Encounter: Payer: Self-pay | Admitting: Neurology

## 2019-08-26 ENCOUNTER — Telehealth: Payer: Self-pay | Admitting: Neurology

## 2019-08-26 ENCOUNTER — Ambulatory Visit (INDEPENDENT_AMBULATORY_CARE_PROVIDER_SITE_OTHER): Payer: Medicare HMO | Admitting: Neurology

## 2019-08-26 VITALS — BP 122/68 | HR 58 | Ht 65.5 in | Wt 154.8 lb

## 2019-08-26 DIAGNOSIS — R404 Transient alteration of awareness: Secondary | ICD-10-CM | POA: Diagnosis not present

## 2019-08-26 DIAGNOSIS — I480 Paroxysmal atrial fibrillation: Secondary | ICD-10-CM | POA: Diagnosis not present

## 2019-08-26 LAB — BASIC METABOLIC PANEL WITH GFR
BUN/Creatinine Ratio: 12 (calc) (ref 6–22)
BUN: 17 mg/dL (ref 7–25)
CO2: 27 mmol/L (ref 20–32)
Calcium: 10.6 mg/dL — ABNORMAL HIGH (ref 8.6–10.4)
Chloride: 104 mmol/L (ref 98–110)
Creat: 1.39 mg/dL — ABNORMAL HIGH (ref 0.60–0.93)
GFR, Est African American: 44 mL/min/{1.73_m2} — ABNORMAL LOW (ref >=60)
GFR, Est Non African American: 38 mL/min/{1.73_m2} — ABNORMAL LOW (ref >=60)
Glucose, Bld: 112 mg/dL — ABNORMAL HIGH (ref 65–99)
Potassium: 4.2 mmol/L (ref 3.5–5.3)
Sodium: 142 mmol/L (ref 135–146)

## 2019-08-26 LAB — VITAMIN B12: Vitamin B-12: 589 pg/mL (ref 200–1100)

## 2019-08-26 LAB — FOLATE: Folate: >24 ng/mL

## 2019-08-26 NOTE — Progress Notes (Signed)
PATIENT: Christine Hogan DOB: [birth year]  Chief Complaint  Patient presents with  . Syncopal Events    No further episodes of passing out. She had normal cardiac event monitoring.  She would like to review her EEG.  She never called back to schedule her brain MRI.  She would like to discuss if the scan is still needed.     HISTORICAL  Christine Hogan is a 73 year old female, seen in request by her primary care physician Dr. Hester Mates for evaluation of passing out spells, she is accompanied by her daughter Christine Hogan at today's visit on May 26, 2019  I have reviewed and summarized the referring note from the referring physician.  She had a past medical history of hypertension, hyperlipidemia, atrial fibrillation, is taking Eliquis.  She was diagnosed with paroxysmal atrial fibrillation in 2019, she presented with sudden onset dizziness, heavy sweat, feels like she is going to pass out, denies chest pain, heart palpitation at that time, diagnosis was confirmed by 30 days cardiac monitoring  She had 2 similar episode, in June 2020, while walking on the [employer] with her sister, she suddenly feel really hot, sick on her stomach, break out into sweat, nauseous, she has to sit down, 15 minutes, as of consciousness  Second episode was on May 13, 2019, while walking, she felt lightheadedness, as if she is going to pass out, she has to sit down  Lab on April 30 2019, Glucose 122, creat 1.52, Hg 11.2  UPDATE Aug 26 2019: The patient's monitoring period was 05/21/2019 - 06/03/2019. Baseline sample showed Sinus Rhythm with a heart rate of 69 bpm. There were no significant abnormality noted.  She has no passing out spells, EEG showed mild dysrhythmic slow background activity,  REVIEW OF SYSTEMS: Full 14 system review of systems performed and notable only for as above. All other review of systems were negative.  ALLERGIES: Allergies  Allergen Reactions  . Codeine Nausea And Vomiting   dizziness    HOME MEDICATIONS: Current Outpatient Medications  Medication Sig Dispense Refill  . amLODipine (NORVASC) 10 MG tablet Take 1 tablet (10 mg total) by mouth daily. 90 tablet 2  . apixaban (ELIQUIS) 5 MG TABS tablet Take 1 tablet (5 mg total) by mouth 2 (two) times daily. 180 tablet 3  . hydrochlorothiazide (HYDRODIURIL) 12.5 MG tablet Take 1 tablet (12.5 mg total) by mouth daily. 90 tablet 3  . HYDROcodone-homatropine (HYCODAN) 5-1.5 MG/5ML syrup Take 5 mLs by mouth every 8 (eight) hours as needed for cough. 120 mL 0  . losartan (COZAAR) 100 MG tablet Take 100 mg by mouth daily.     . metoprolol tartrate (LOPRESSOR) 50 MG tablet Take 1 tablet (50 mg total) by mouth 2 (two) times daily. 180 tablet 3  . nitroGLYCERIN (NITROSTAT) 0.4 MG SL tablet Place 1 tablet (0.4 mg total) under the tongue every 5 (five) minutes as needed for chest pain. 20 tablet 0  . ondansetron (ZOFRAN-ODT) 4 MG disintegrating tablet Take 1 tablet (4 mg total) by mouth every 8 (eight) hours as needed for nausea or vomiting. 15 tablet 0  . rosuvastatin (CRESTOR) 10 MG tablet TAKE 1 TABLET BY MOUTH ONCE DAILY 90 tablet 3   No current facility-administered medications for this visit.     PAST MEDICAL HISTORY: Past Medical History:  Diagnosis Date  . Arthritis   . Atrial fibrillation ([place])   . Chest pain    a. 2014: NST with no evidence of ischemia  .  Diastolic dysfunction    grade 2 with dyspnea on exertion  . H/O echocardiogram    a. 2014: echo showing EF of 65-70% with Grade 1 DD, moderate TR, and mild to moderate MR  . Hyperlipidemia   . Hypertension   . Near syncope   . Osteopenia     PAST SURGICAL HISTORY: Past Surgical History:  Procedure Laterality Date  . ABDOMINAL HYSTERECTOMY     TAH/BSO (menorrhagia)  . bunion repair    . HERNIA REPAIR      FAMILY HISTORY: Family History  Problem Relation Age of Onset  . Hypertension Father   . Heart disease Father        pacemaker- followedby  Dr. Royann Shiverscroitoru  . Diabetes Sister   . Hypertension Sister   . Diabetes Sister   . Hypertension Sister   . Hypertension Daughter   . Hypertension Daughter   . Kidney failure Mother     SOCIAL HISTORY: Social History   Socioeconomic History  . Marital status: Divorced    Spouse name: Not on file  . Number of children: 5  . Years of education: 12th grade  . Highest education level: Not on file  Occupational History  . Occupation: Retired  Engineer, productionocial Needs  . Financial resource strain: Not on file  . Food insecurity    Worry: Not on file    Inability: Not on file  . Transportation needs    Medical: Not on file    Non-medical: Not on file  Tobacco Use  . Smoking status: Never Smoker  . Smokeless tobacco: Never Used  Substance and Sexual Activity  . Alcohol use: Yes  . Drug use: No  . Sexual activity: Yes  Lifestyle  . Physical activity    Days per week: Not on file    Minutes per session: Not on file  . Stress: Not on file  Relationships  . Social Musicianconnections    Talks on phone: Not on file    Gets together: Not on file    Attends religious service: Not on file    Active member of club or organization: Not on file    Attends meetings of clubs or organizations: Not on file    Relationship status: Not on file  . Intimate partner violence    Fear of current or ex partner: Not on file    Emotionally abused: Not on file    Physically abused: Not on file    Forced sexual activity: Not on file  Other Topics Concern  . Not on file  Social History Narrative   Lives with her son.   Right-handed.   Four living children.   Occasional caffeine use.     PHYSICAL EXAM   Vitals:   08/26/19 0954  BP: 122/68  Pulse: (!) 58  Weight: 154 lb 12 oz (70.2 kg)  Height: 5' 5.5" (1.664 m)    Not recorded      Body mass index is 25.36 kg/m.  PHYSICAL EXAMNIATION:  Gen: NAD, conversant, well nourised, obese, well groomed                     Cardiovascular: Regular rate  rhythm, no peripheral edema, warm, nontender. Eyes: Conjunctivae clear without exudates or hemorrhage Neck: Supple, no carotid bruits. Pulmonary: Clear to auscultation bilaterally   NEUROLOGICAL EXAM:  MENTAL STATUS: Speech:    Speech is normal; fluent and spontaneous with normal comprehension.  Cognition:     Orientation to time, place  and person     Normal recent and remote memory     Normal Attention span and concentration     Normal Language, naming, repeating,spontaneous speech     Fund of knowledge   CRANIAL NERVES: CN II: Visual fields are full to confrontation.  Pupils are round equal and briskly reactive to light. CN III, IV, VI: extraocular movement are normal. No ptosis. CN V: Facial sensation is intact to pinprick in all 3 divisions bilaterally. Corneal responses are intact.  CN VII: Face is symmetric with normal eye closure and smile. CN VIII: Hearing is normal to rubbing fingers CN IX, X: Palate elevates symmetrically. Phonation is normal. CN XI: Head turning and shoulder shrug are intact CN XII: Tongue is midline with normal movements and no atrophy.  MOTOR: There is no pronator drift of out-stretched arms. Muscle bulk and tone are normal. Muscle strength is normal.  REFLEXES: Reflexes are 2+ and symmetric at the biceps, triceps, knees, and ankles. Plantar responses are flexor.  SENSORY: Intact to light touch, pinprick, positional sensation and vibratory sensation are intact in fingers and toes.  COORDINATION: Rapid alternating movements and fine finger movements are intact. There is no dysmetria on finger-to-nose and heel-knee-shin.    GAIT/STANCE: Posture is normal. Gait is steady with normal steps, base, arm swing, and turning. Heel and toe walking are normal. Tandem gait is normal.  Romberg is absent.   DIAGNOSTIC DATA (LABS, IMAGING, TESTING) - I reviewed patient records, labs, notes, testing and imaging myself where available.   ASSESSMENT AND PLAN   Christine Hogan is a 73 y.o. female   Pass out spells  Most likely syncope, remote possibility of seizure  EEG showed mild dysrhythmic slow background activity  2 weeks cardiac monitoring showed no significant abnormalities  MRI of brain is pending  Levert Feinstein, M.D. Ph.D.  [facility] [address] [place], [place] [address] Ph: [phone] Fax: [phone]  CC: Referring Provider

## 2019-08-26 NOTE — Telephone Encounter (Signed)
Please schedule her MRI brain

## 2019-08-26 NOTE — Telephone Encounter (Signed)
Noted, GI attempted 3 times to schedule with the patient. On 05/29/19, 06/05/19 & 06/12/19. I will resend the order and they will reach out to the patient to schedule.  Auth stays the same [id number] (exp. 08/26/19 to 09/26/19)

## 2019-08-29 LAB — VITAMIN B1: Vitamin B1 (Thiamine): 13 nmol/L (ref 8–30)

## 2019-09-08 ENCOUNTER — Other Ambulatory Visit: Payer: Self-pay | Admitting: Family Medicine

## 2019-09-10 NOTE — Telephone Encounter (Signed)
Same auth number expire dates are from 09/12/19 to 10/12/19 patient is scheduled at GI for 09/12/19.

## 2019-09-12 ENCOUNTER — Other Ambulatory Visit: Payer: Self-pay

## 2019-09-12 ENCOUNTER — Ambulatory Visit
Admission: RE | Admit: 2019-09-12 | Discharge: 2019-09-12 | Disposition: A | Discharge status: Home / Self Care | Payer: Medicare HMO | Source: Ambulatory Visit | Attending: Neurology | Admitting: Neurology

## 2019-09-12 DIAGNOSIS — R55 Syncope and collapse: Secondary | ICD-10-CM

## 2019-09-12 DIAGNOSIS — I480 Paroxysmal atrial fibrillation: Secondary | ICD-10-CM

## 2019-09-15 ENCOUNTER — Telehealth: Payer: Self-pay | Admitting: Neurology

## 2019-09-15 DIAGNOSIS — I480 Paroxysmal atrial fibrillation: Secondary | ICD-10-CM

## 2019-09-15 DIAGNOSIS — R55 Syncope and collapse: Secondary | ICD-10-CM

## 2019-09-15 DIAGNOSIS — R9089 Other abnormal findings on diagnostic imaging of central nervous system: Secondary | ICD-10-CM | POA: Insufficient documentation

## 2019-09-15 NOTE — Telephone Encounter (Signed)
I was able to speak to the patient.  She verbalized understanding of her MRI brain results.  She is agreeable to get MRA scans completed.

## 2019-09-15 NOTE — Telephone Encounter (Signed)
Left message, on home and mobile number, requesting a call back.

## 2019-09-15 NOTE — Telephone Encounter (Signed)
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   T2/flair hyperintense foci in the hemispheres most consistent with mild chronic microvascular ischemic changes. 2.   There appears to be reduced flow in the left internal carotid artery.  Recommend evaluation with MR or CT angiography if clinically indicated.

## 2019-09-15 NOTE — Telephone Encounter (Signed)
Left second message requesting a return call. 

## 2019-09-15 NOTE — Telephone Encounter (Signed)
[employer] pending faxed notes.  

## 2019-09-29 ENCOUNTER — Other Ambulatory Visit: Payer: Self-pay | Admitting: *Deleted

## 2019-09-29 ENCOUNTER — Telehealth: Payer: Self-pay | Admitting: *Deleted

## 2019-09-29 DIAGNOSIS — I480 Paroxysmal atrial fibrillation: Secondary | ICD-10-CM

## 2019-09-29 DIAGNOSIS — R55 Syncope and collapse: Secondary | ICD-10-CM

## 2019-09-29 DIAGNOSIS — R9089 Other abnormal findings on diagnostic imaging of central nervous system: Secondary | ICD-10-CM

## 2019-09-29 NOTE — Telephone Encounter (Signed)
Christine Hogan from [facility] called to request change of MRA neck to w/wo contrast.  Per vo by Dr. Rexene Alberts, okay to change order.  Due to the patient's most recent kidney function labs, she will only get half dose of contrast.  Order updated in Epic.

## 2019-10-21 ENCOUNTER — Ambulatory Visit
Admission: RE | Admit: 2019-10-21 | Discharge: 2019-10-21 | Disposition: A | Discharge status: Home / Self Care | Payer: Medicare HMO | Source: Ambulatory Visit | Attending: Neurology | Admitting: Neurology

## 2019-10-21 ENCOUNTER — Other Ambulatory Visit: Payer: Self-pay

## 2019-10-21 DIAGNOSIS — I480 Paroxysmal atrial fibrillation: Secondary | ICD-10-CM | POA: Diagnosis not present

## 2019-10-21 DIAGNOSIS — R9089 Other abnormal findings on diagnostic imaging of central nervous system: Secondary | ICD-10-CM | POA: Diagnosis not present

## 2019-10-21 DIAGNOSIS — R55 Syncope and collapse: Secondary | ICD-10-CM

## 2019-10-21 MED ORDER — GADOBENATE DIMEGLUMINE 529 MG/ML IV SOLN
7.0000 mL | Freq: Once | INTRAVENOUS | Status: AC | PRN
  Administered 2019-10-21: 7 mL via INTRAVENOUS

## 2019-10-23 ENCOUNTER — Telehealth: Payer: Self-pay | Admitting: *Deleted

## 2019-10-23 NOTE — Telephone Encounter (Signed)
I spoke to the patient and she has been informed of her results.

## 2019-10-23 NOTE — Telephone Encounter (Signed)
-----   Message from Star Age, MD sent at 10/23/2019  4:57 PM EST ----- MRA neck was reported as normal. Please update pt.

## 2019-10-23 NOTE — Progress Notes (Signed)
MRA neck was reported as normal. Please update pt.

## 2019-10-23 NOTE — Telephone Encounter (Signed)
-----   Message from Marcial Pacas, MD sent at 10/23/2019  3:13 PM EST ----- Please call pt for normal MRA of the brain.

## 2019-10-23 NOTE — Telephone Encounter (Signed)
I was able to speak the patient and provided her with the results below.  She verbalized understanding.

## 2019-10-23 NOTE — Telephone Encounter (Signed)
Called patient on home #, call was picked up but no one spoke.

## 2019-12-11 ENCOUNTER — Telehealth: Payer: Self-pay | Admitting: Cardiovascular Disease

## 2019-12-11 NOTE — Telephone Encounter (Signed)
Letter printed and placed at the front desk for pick up  Pt notified

## 2019-12-11 NOTE — Telephone Encounter (Signed)
New Message    We are recommending the COVID-19 vaccine to all of our patients. Cardiac medications (including blood thinners) should not deter anyone from being vaccinated and there is no need to hold any of those medications prior to vaccine administration.     Currently, there is a hotline to call (active 11/14/19) to schedule vaccination appointments as no walk-ins will be accepted.   Number: [phone].    If an appointment is not available please go to [email or web address] to sign up for notification when additional vaccine appointments are available.   If you have further questions or concerns about the vaccine process, please visit [email or web address] or contact your primary care physician.    Pt is stating she was advised she will need a note before she can get her vaccine that is scheduled on Saturday.   Please advise

## 2019-12-11 NOTE — Progress Notes (Signed)
see phone note dated 12-11-2019:  Pt is stating she was advised she will need a note before she can get her vaccine that is scheduled on Saturday.   Please advise

## 2020-01-03 DIAGNOSIS — I10 Essential (primary) hypertension: Secondary | ICD-10-CM | POA: Diagnosis not present

## 2020-01-03 DIAGNOSIS — R0902 Hypoxemia: Secondary | ICD-10-CM | POA: Diagnosis not present

## 2020-01-06 ENCOUNTER — Emergency Department (HOSPITAL_COMMUNITY)
Admission: EM | Admit: 2020-01-06 | Discharge: 2020-01-06 | Disposition: A | Discharge status: Home / Self Care | Payer: Medicare Other | Attending: Emergency Medicine | Admitting: Emergency Medicine

## 2020-01-06 ENCOUNTER — Other Ambulatory Visit: Payer: Self-pay

## 2020-01-06 ENCOUNTER — Emergency Department (HOSPITAL_COMMUNITY): Payer: Medicare Other

## 2020-01-06 ENCOUNTER — Encounter (HOSPITAL_COMMUNITY): Payer: Self-pay

## 2020-01-06 DIAGNOSIS — R112 Nausea with vomiting, unspecified: Secondary | ICD-10-CM | POA: Diagnosis not present

## 2020-01-06 DIAGNOSIS — I5030 Unspecified diastolic (congestive) heart failure: Secondary | ICD-10-CM | POA: Diagnosis not present

## 2020-01-06 DIAGNOSIS — Z79899 Other long term (current) drug therapy: Secondary | ICD-10-CM | POA: Insufficient documentation

## 2020-01-06 DIAGNOSIS — R1111 Vomiting without nausea: Secondary | ICD-10-CM | POA: Diagnosis not present

## 2020-01-06 DIAGNOSIS — I110 Hypertensive heart disease with heart failure: Secondary | ICD-10-CM | POA: Diagnosis not present

## 2020-01-06 DIAGNOSIS — I10 Essential (primary) hypertension: Secondary | ICD-10-CM | POA: Diagnosis not present

## 2020-01-06 DIAGNOSIS — I517 Cardiomegaly: Secondary | ICD-10-CM | POA: Diagnosis not present

## 2020-01-06 DIAGNOSIS — F1010 Alcohol abuse, uncomplicated: Secondary | ICD-10-CM | POA: Diagnosis not present

## 2020-01-06 DIAGNOSIS — Z7901 Long term (current) use of anticoagulants: Secondary | ICD-10-CM | POA: Insufficient documentation

## 2020-01-06 DIAGNOSIS — R52 Pain, unspecified: Secondary | ICD-10-CM | POA: Diagnosis present

## 2020-01-06 DIAGNOSIS — R531 Weakness: Secondary | ICD-10-CM | POA: Diagnosis not present

## 2020-01-06 DIAGNOSIS — E860 Dehydration: Secondary | ICD-10-CM | POA: Diagnosis not present

## 2020-01-06 DIAGNOSIS — R110 Nausea: Secondary | ICD-10-CM | POA: Diagnosis not present

## 2020-01-06 LAB — CBC WITH DIFFERENTIAL/PLATELET
Abs Immature Granulocytes: 0.02 10*3/uL (ref 0.00–0.07)
Basophils Absolute: 0 10*3/uL (ref 0.0–0.1)
Basophils Relative: 0 %
Eosinophils Absolute: 0 10*3/uL (ref 0.0–0.5)
Eosinophils Relative: 1 %
HCT: 39.9 % (ref 36.0–46.0)
Hemoglobin: 12.7 g/dL (ref 12.0–15.0)
Immature Granulocytes: 1 %
Lymphocytes Relative: 36 %
Lymphs Abs: 1.3 10*3/uL (ref 0.7–4.0)
MCH: 28.7 pg (ref 26.0–34.0)
MCHC: 31.8 g/dL (ref 30.0–36.0)
MCV: 90.3 fL (ref 80.0–100.0)
Monocytes Absolute: 0.6 10*3/uL (ref 0.1–1.0)
Monocytes Relative: 16 %
Neutro Abs: 1.7 10*3/uL (ref 1.7–7.7)
Neutrophils Relative %: 46 %
Platelets: 182 10*3/uL (ref 150–400)
RBC: 4.42 MIL/uL (ref 3.87–5.11)
RDW: 14.6 % (ref 11.5–15.5)
WBC: 3.7 10*3/uL — ABNORMAL LOW (ref 4.0–10.5)
nRBC: 0 % (ref 0.0–0.2)

## 2020-01-06 LAB — COMPREHENSIVE METABOLIC PANEL
ALT: 20 U/L (ref 0–44)
AST: 29 U/L (ref 15–41)
Albumin: 4.2 g/dL (ref 3.5–5.0)
Alkaline Phosphatase: 69 U/L (ref 38–126)
Anion gap: 13 (ref 5–15)
BUN: 12 mg/dL (ref 8–23)
CO2: 24 mmol/L (ref 22–32)
Calcium: 9.4 mg/dL (ref 8.9–10.3)
Chloride: 100 mmol/L (ref 98–111)
Creatinine, Ser: 1.24 mg/dL — ABNORMAL HIGH (ref 0.44–1.00)
GFR calc Af Amer: 50 mL/min — ABNORMAL LOW (ref >60)
GFR calc non Af Amer: 43 mL/min — ABNORMAL LOW (ref >60)
Glucose, Bld: 139 mg/dL — ABNORMAL HIGH (ref 70–99)
Potassium: 3.2 mmol/L — ABNORMAL LOW (ref 3.5–5.1)
Sodium: 137 mmol/L (ref 135–145)
Total Bilirubin: 1.3 mg/dL — ABNORMAL HIGH (ref 0.3–1.2)
Total Protein: 7.7 g/dL (ref 6.5–8.1)

## 2020-01-06 LAB — URINALYSIS, ROUTINE W REFLEX MICROSCOPIC
Bilirubin Urine: NEGATIVE
Glucose, UA: NEGATIVE mg/dL
Ketones, ur: NEGATIVE mg/dL
Leukocytes,Ua: NEGATIVE
Nitrite: NEGATIVE
Protein, ur: >=300 mg/dL — AB
Specific Gravity, Urine: 1.008 (ref 1.005–1.030)
pH: 7 (ref 5.0–8.0)

## 2020-01-06 LAB — CK: Total CK: 145 U/L (ref 38–234)

## 2020-01-06 LAB — ETHANOL: Alcohol, Ethyl (B): <10 mg/dL (ref <10)

## 2020-01-06 LAB — TROPONIN I (HIGH SENSITIVITY): Troponin I (High Sensitivity): 14 ng/L (ref <18)

## 2020-01-06 MED ORDER — LORAZEPAM 1 MG PO TABS: 1.0000 mg | ORAL_TABLET | Freq: Three times a day (TID) | ORAL | 0 refills | Status: DC | PRN

## 2020-01-06 MED ORDER — SODIUM CHLORIDE 0.9 % IV BOLUS (SEPSIS)
500.0000 mL | Freq: Once | INTRAVENOUS | Status: AC
  Administered 2020-01-06: 500 mL via INTRAVENOUS

## 2020-01-06 MED ORDER — LORAZEPAM 2 MG/ML IJ SOLN
1.0000 mg | Freq: Once | INTRAMUSCULAR | Status: AC
  Administered 2020-01-06: 1 mg via INTRAVENOUS
  Filled 2020-01-06: qty 1

## 2020-01-06 NOTE — ED Notes (Signed)
Pt awaiting ride at this time

## 2020-01-06 NOTE — ED Notes (Signed)
Patient ambulated to restroom independently.

## 2020-01-06 NOTE — ED Triage Notes (Signed)
Generalized body aches after second covid vaccine.

## 2020-01-06 NOTE — Discharge Instructions (Addendum)
Substance Abuse Treatment Programs ° °Intensive Outpatient Programs °[facility]     °[address]      °[place], [place]                   °[phone]      ° °[facility] °[address] °[place], [place] °[phone] ° °[facility]     °(Inpatient and outpatient)     °[address]           °[phone]   ° °[facility] °336-288-1484 (Suboxone and Methadone) ° °[address]      °[place], [place] [address]      °336-882-2125      ° °[address] °[place], [place] °852-3033 ° °[facility] (Outpatient/Inpatient, [facility])    °(insurance only) [phone]      °       °[facility] (Groups & Residential) °[place], [place] °[phone] ° °   °[facility]     °[address]     °[place], [place]      °[phone]      ° °[facility] (for caregivers and family) °[address] °[place], [place] °336-299-4655 ° ° ° ° ° °Residential Treatment Programs °[facility]      °[address], [place], [place] [address]  °([phone]      ° °[facility] °[address] [place], [place] [address] °[phone] ° °[facility]        °336-248-8914      ° °[facility] °1-800-659-3381 ° °[facility]             °[address]                                         °[place], [place]                                                °877-615-2722 or 336-784-9470                              ° °[facility] [place] °[address] °[place], [address] °1.877.941.8954 ° °[facility]    °[address]      °[place], [place]     °[phone]      ° °[facility] °[address] °, [place] °336-285-9073 ° °[facility]   °[address]     °[place], [place] [address]     °[phone]      °Admissions: 8am-3pm M-F ° °[facility] °[address] °[place] °336-227-7417 ° °[facility]: Residential Program (90 Days)   °[place], [place]      °336-725-8389 or 800-758-6077    ° °[facility] °[place], [place] °(Walk in Hours over the weekend or by referral) ° °[facility] °[address], [place], [place] [address] °([phone] ° °C[facility] Mobile: [facility]:  1-877-626-1772 (for crisis response 24 hours a day) °[facility] Hotline:      1-800-256-2452 °Outpatient Psychiatry and Counseling ° °[facility]: Mobile Crisis   Management 24 hours:  1-877-626-1772 ° °[facility] sliding scale fee and walk in schedule: M-F 8am-12pm/1pm-3pm °[address]  °[place], [place] [address] °[phone] ° °[facility] °[address] °[place], [place] [address] °336-703-9650 ° °[facility] ([facility] new patient walk-in appointments available Monday - Friday 8am -3pm.          °[address] °[place], [place] [address] °336-676-6840 or crisis line- 336-676-6905 ° °[facility]ensive [facility] °[address] °[place], [place] [address] °336-832-9804 ° °[facility]                  °[facility]      °[phone]      °[address]     °[place], [place] [address]                ° °[facility]   °[facility] °[phone] °[address] °[place], [place] [address] ° ° °[facility]          °[address]  °[place], [place] [address]       °([phone] ° °[facility] °[address] °[place], [place] [address] °336-292-1510 ° °[facility]    °[address]    °[place], [place] [address]     °[phone]      ° °[facility], [facility]     °[address]     °[place] [address]     °336-282-1251     °  °[facility] °[address] °[place] [address] ° °[facility]     °[address]     °[place], [place]      °336-542-2076      ° °[facility] °[facility] °[address] °[place], [place] [address] °[phone] ° °[facility]     °[address]     °[place], [place] [address]     °336-274-1237      ° °[facility] °[address] °[place], [place] [address] °336-854-4450 °Resources for Temporary Residential Assistance/Crisis Centers ° °DAY CENTERS °[facility] °M-F 8am-3pm   °[address] [place], [place] [address]   336-332-0824 °[facility] include: laundry, barbering, support groups, case management, phone  & computer access, showers, AA/NA mtgs, mental health/substance abuse nurse, job skills class, disability information, VA assistance, spiritual classes, etc.  ° °HOMELESS SHELTERS ° °[facility]     °[facility]   °[address], [place]     °[phone]       °       °[name]       °[address] °[place], [place] [address] °336-275-0820 °[name] °Application Required ° °[facility]   °[address]    °[place] [address]     °[phone]       °             °[facility] °[address] °[place], [place] [address] °336.273.5572 °336-235-0363(schedule application appt.) °Application Required ° °[facility] (women only)    °[address]     °[place], [place] [address]     °336-884-1039      °  Intake starts 6pm daily °Need valid ID, SSC, & Police report °[facility] [place] °[address] °[place], [place] °336-881-5420 °Application Required ° °[facility] (men only)     °[address]      °[place], [place]     °[phone]      ° °[facility] °(Pregnant women only) °[address] °[place], [place] °336-275-0206 ° °[facility]      °[address]      °[place], [place] [address]     °[phone]      °       °[facility] °[address] °[place], [place] °336-723-1848 °90 day commitment/SA/Application process ° °[facility](men only)     °[address]     °[place], [place]     °[phone]       °Check-in at 7pm     °       °[facility] °[address] °[place], [place] [address] °336-248-6684 °Men/Women/Women and Children must be there by 7 pm ° °[facility] °[place], [place] °336-722-8721                ° °

## 2020-01-06 NOTE — ED Notes (Signed)
Pt requesting prescription for alcohol withdrawal. MD aware

## 2020-01-06 NOTE — ED Provider Notes (Signed)
Patient now feeling much better.  No further vomiting.  No tachycardia.  Patient states she feels well enough to go home.  Will discharge.  Patient troponin came in at 14.  Not significantly elevated no current chest pain denies any chest pain at all.  Do not feel she needs a delta troponin.   Vanetta Mulders, MD 01/06/20 [phone]

## 2020-01-06 NOTE — ED Provider Notes (Signed)
[facility] DEPT Provider Note   CSN: [id number] Arrival date & time: 01/06/20  1478     History Chief Complaint  Patient presents with  . Generalized Body Aches    after second covid vaccine    Christine Hogan is a 74 y.o. female.  The history is provided by the patient.  Weakness Severity:  Moderate Onset quality:  Gradual Duration:  24 hours Timing:  Constant Progression:  Worsening Chronicity:  New Context: alcohol use   Context comment:  Covid vaccine Relieved by:  Nothing Worsened by:  Nothing Associated symptoms: myalgias and vomiting   Associated symptoms: no abdominal pain, no chest pain, no cough, no diarrhea, no dysuria, no numbness in extremities, no fever, no headaches, no loss of consciousness, no seizures, no shortness of breath, no stroke symptoms and no syncope   Patient with history of atrial fibrillation, hypertension presents with generalized weakness.  Patient received her second COVID-19 vaccination on February 27.  Later that day she began drinking alcohol heavily to celebrate her birthday.  She reports the following day she felt she had a hangover.  However the past 24 hours her symptoms have worsened with nausea and vomiting.  She reports generalized fatigue and myalgias. No fevers.  No headache.  No chest pain shortness of breath.  No cough. No focal weakness    Patient admits that she drinks alcohol on a daily basis.  Last drink was on February 28.  She reports she had an episode of withdrawal in the past.  No known seizures Past Medical History:  Diagnosis Date  . Arthritis   . Atrial fibrillation ([place])   . Chest pain    a. 2014: NST with no evidence of ischemia  . Diastolic dysfunction    grade 2 with dyspnea on exertion  . H/O echocardiogram    a. 2014: echo showing EF of 65-70% with Grade 1 DD, moderate TR, and mild to moderate MR  . Hyperlipidemia   . Hypertension   . Near syncope   . Osteopenia     Patient  Active Problem List   Diagnosis Date Noted  . Abnormal finding on MRI of brain 09/15/2019  . Alteration of consciousness 08/26/2019  . Syncope 05/26/2019  . Nonrheumatic mitral (valve) insufficiency 10/08/2018  . Vasovagal syncope 10/08/2018  . Osteopenia   . Atrial fibrillation ([place])   . Diastolic dysfunction   . Paroxysmal atrial fibrillation ([place]) 02/09/2017  . Long term current use of anticoagulant 02/09/2017  . Chronic renal insufficiency 06/28/2013  . Chest pain 06/27/2013  . Essential hypertension   . Mixed hyperlipidemia     Past Surgical History:  Procedure Laterality Date  . ABDOMINAL HYSTERECTOMY     TAH/BSO (menorrhagia)  . bunion repair    . HERNIA REPAIR       OB History   No obstetric history on file.     Family History  Problem Relation Age of Onset  . Hypertension Father   . Heart disease Father        pacemaker- followedby Dr. Sallyanne Kuster  . Diabetes Sister   . Hypertension Sister   . Diabetes Sister   . Hypertension Sister   . Hypertension Daughter   . Hypertension Daughter   . Kidney failure Mother     Social History   Tobacco Use  . Smoking status: Never Smoker  . Smokeless tobacco: Never Used  Substance Use Topics  . Alcohol use: Yes  . Drug use: No  Home Medications Prior to Admission medications   Medication Sig Start Date End Date Taking? Authorizing Provider  amLODipine (NORVASC) 10 MG tablet Take 1 tablet (10 mg total) by mouth daily. 04/24/19   Donita Brooks, MD  apixaban (ELIQUIS) 5 MG TABS tablet Take 1 tablet (5 mg total) by mouth 2 (two) times daily. 05/27/19   Croitoru, Mihai, MD  hydrochlorothiazide (HYDRODIURIL) 12.5 MG tablet Take 1 tablet (12.5 mg total) by mouth daily. 05/16/19   Croitoru, Mihai, MD  HYDROcodone-homatropine (HYCODAN) 5-1.5 MG/5ML syrup Take 5 mLs by mouth every 8 (eight) hours as needed for cough. 08/15/19   Donita Brooks, MD  losartan (COZAAR) 100 MG tablet Take 100 mg by mouth daily.  08/24/19    [provider]  metoprolol tartrate (LOPRESSOR) 50 MG tablet Take 1 tablet (50 mg total) by mouth 2 (two) times daily. 05/16/19   Croitoru, Mihai, MD  nitroGLYCERIN (NITROSTAT) 0.4 MG SL tablet Place 1 tablet (0.4 mg total) under the tongue every 5 (five) minutes as needed for chest pain. 12/11/13   Salley Scarlet, MD  ondansetron (ZOFRAN-ODT) 4 MG disintegrating tablet Take 1 tablet (4 mg total) by mouth every 8 (eight) hours as needed for nausea or vomiting. 08/21/19   Mardella Layman, MD  rosuvastatin (CRESTOR) 10 MG tablet TAKE 1 TABLET BY MOUTH ONCE DAILY 04/24/19   Donita Brooks, MD    Allergies    Codeine  Review of Systems   Review of Systems  Constitutional: Positive for fatigue. Negative for fever.  Respiratory: Negative for cough and shortness of breath.   Cardiovascular: Negative for chest pain and syncope.  Gastrointestinal: Positive for vomiting. Negative for abdominal pain and diarrhea.  Genitourinary: Negative for dysuria.  Musculoskeletal: Positive for myalgias.  Neurological: Positive for weakness. Negative for seizures, loss of consciousness and headaches.  All other systems reviewed and are negative.   Physical Exam Updated Vital Signs BP (!) 168/82 (BP Location: Right Arm)   Pulse 67   Temp 98.3 F (36.8 C) (Oral)   Resp 17   Ht 1.676 m (5\' 6" )   Wt 74.8 kg   SpO2 100%   BMI 26.63 kg/m   Physical Exam CONSTITUTIONAL: Elderly, no acute distress HEAD: Normocephalic/atraumatic EYES: EOMI/PERRL, mild icterus ENMT: Mucous membranes dry NECK: supple no meningeal signs SPINE/BACK:entire spine nontender CV: S1/S2 noted, no murmurs/rubs/gallops noted LUNGS: Coarse breath sounds at the bases, no apparent distress ABDOMEN: soft, nontender, no rebound or guarding, bowel sounds noted throughout abdomen GU:no cva tenderness NEURO: Pt is awake/alert/appropriate, moves all extremitiesx4.  No facial droop.  No arm or leg drift.  Mild tremors noted in the  upper extremities EXTREMITIES: pulses normal/equal, full ROM SKIN: warm, color normal PSYCH: no abnormalities of mood noted, alert and oriented to situation  ED Results / Procedures / Treatments   Labs (all labs ordered are listed, but only abnormal results are displayed) Labs Reviewed  CBC WITH DIFFERENTIAL/PLATELET - Abnormal; Notable for the following components:      Result Value   WBC 3.7 (*)    All other components within normal limits  COMPREHENSIVE METABOLIC PANEL  CK  URINALYSIS, ROUTINE W REFLEX MICROSCOPIC  ETHANOL  TROPONIN I (HIGH SENSITIVITY)    EKG  ED ECG REPORT   Date: 01/06/2020 03/07/2020  Rate: 59  Rhythm: normal sinus rhythm  QRS Axis: right  Intervals: normal  ST/T Wave abnormalities: nonspecific T wave changes  Conduction Disutrbances:none  Narrative Interpretation:   Old EKG Reviewed:  changes noted  I have personally reviewed the EKG tracing and agree with the computerized printout as noted.  Radiology DG Chest Port 1 View  Result Date: 01/06/2020 CLINICAL DATA:  Weakness EXAM: PORTABLE CHEST 1 VIEW COMPARISON:  None. FINDINGS: The heart size and mediastinal contours are mildly enlarged. Aortic knob calcifications. There is a moderate to large hiatal hernia present. No large airspace consolidation or pleural effusion. No acute osseous abnormality. IMPRESSION: Unchanged mild cardiomegaly. Moderate to large hiatal hernia. Electronically Signed   By: Jonna Clark M.D.   On: 01/06/2020 06:56    Procedures Procedures  Medications Ordered in ED Medications  sodium chloride 0.9 % bolus 500 mL (500 mLs Intravenous New Bag/Given (Non-Interop) 01/06/20 5956)  LORazepam (ATIVAN) injection 1 mg (1 mg Intravenous Given 01/06/20 3875)    ED Course  I have reviewed the triage vital signs and the nursing notes.  Pertinent labs & imaging results that were available during my care of the patient were reviewed by me and considered in my medical decision making (see chart  for details).    MDM Rules/Calculators/A&P                      6:44 AM Patient presents with nonspecific complaints of generalized fatigue and myalgias.  She reports she thinks it is due to COVID-19 vaccine in addition to alcohol abuse.  It has been almost  2 days since last drink of alcohol, therefore she could be undergoing alcohol withdrawal.  Plan will be to check labs, chest x-ray and reassess. Patient counseled on alcohol abuse. .6:56 AM EKG reveals new T wave changes in inferior leads. Will check troponin though patient denies CP 7:02 AM Signed out to Dr. Deretha Emory with labs pending and recheck Final Clinical Impression(s) / ED Diagnoses Final diagnoses:  Alcohol abuse    Rx / DC Orders ED Discharge Orders    None       Zadie Rhine, MD 01/06/20 [phone]

## 2020-03-08 ENCOUNTER — Encounter: Payer: Self-pay | Admitting: Family Medicine

## 2020-03-08 ENCOUNTER — Other Ambulatory Visit: Payer: Self-pay

## 2020-03-08 ENCOUNTER — Ambulatory Visit (INDEPENDENT_AMBULATORY_CARE_PROVIDER_SITE_OTHER): Payer: Medicare Other | Admitting: Family Medicine

## 2020-03-08 VITALS — BP 150/78 | HR 66 | Temp 96.3°F | Resp 12 | Ht 65.5 in | Wt 158.0 lb

## 2020-03-08 DIAGNOSIS — R1013 Epigastric pain: Secondary | ICD-10-CM

## 2020-03-08 LAB — CBC WITH DIFFERENTIAL/PLATELET
Absolute Monocytes: 592 cells/uL (ref 200–950)
Basophils Absolute: 12 cells/uL (ref 0–200)
Basophils Relative: 0.2 %
Eosinophils Absolute: 58 cells/uL (ref 15–500)
Eosinophils Relative: 1 %
HCT: 35.4 % (ref 35.0–45.0)
Hemoglobin: 11.7 g/dL (ref 11.7–15.5)
Lymphs Abs: 2256 cells/uL (ref 850–3900)
MCH: 28.6 pg (ref 27.0–33.0)
MCHC: 33.1 g/dL (ref 32.0–36.0)
MCV: 86.6 fL (ref 80.0–100.0)
MPV: 10.4 fL (ref 7.5–12.5)
Monocytes Relative: 10.2 %
Neutro Abs: 2883 cells/uL (ref 1500–7800)
Neutrophils Relative %: 49.7 %
Platelets: 192 10*3/uL (ref 140–400)
RBC: 4.09 10*6/uL (ref 3.80–5.10)
RDW: 13.5 % (ref 11.0–15.0)
Total Lymphocyte: 38.9 %
WBC: 5.8 10*3/uL (ref 3.8–10.8)

## 2020-03-08 LAB — COMPLETE METABOLIC PANEL WITH GFR
AG Ratio: 1.5 (calc) (ref 1.0–2.5)
ALT: 17 U/L (ref 6–29)
AST: 19 U/L (ref 10–35)
Albumin: 4.3 g/dL (ref 3.6–5.1)
Alkaline phosphatase (APISO): 63 U/L (ref 37–153)
BUN/Creatinine Ratio: 14 (calc) (ref 6–22)
BUN: 23 mg/dL (ref 7–25)
CO2: 27 mmol/L (ref 20–32)
Calcium: 10 mg/dL (ref 8.6–10.4)
Chloride: 104 mmol/L (ref 98–110)
Creat: 1.67 mg/dL — ABNORMAL HIGH (ref 0.60–0.93)
GFR, Est African American: 35 mL/min/{1.73_m2} — ABNORMAL LOW (ref >=60)
GFR, Est Non African American: 30 mL/min/{1.73_m2} — ABNORMAL LOW (ref >=60)
Globulin: 2.9 g/dL (calc) (ref 1.9–3.7)
Glucose, Bld: 120 mg/dL — ABNORMAL HIGH (ref 65–99)
Potassium: 3.9 mmol/L (ref 3.5–5.3)
Sodium: 140 mmol/L (ref 135–146)
Total Bilirubin: 1 mg/dL (ref 0.2–1.2)
Total Protein: 7.2 g/dL (ref 6.1–8.1)

## 2020-03-08 LAB — LIPASE: Lipase: 31 U/L (ref 7–60)

## 2020-03-08 MED ORDER — PANTOPRAZOLE SODIUM 40 MG PO TBEC: 40.0000 mg | DELAYED_RELEASE_TABLET | Freq: Every day | ORAL | 3 refills | Status: DC

## 2020-03-08 NOTE — Progress Notes (Signed)
Subjective:    Patient ID: Christine Hogan, female    DOB: Sep 24, 1946, 74 y.o.   MRN: [id number]  HPI  Patient is a very sweet 74 year old African-American female who presents today with several weeks of abdominal pain.  It does not seem to localize into one position.  She states that it is near her umbilicus.  However on her exam today it seems to be more in the epigastric area.  She states that it comes and goes.  She denies any constipation.  Defecation does not help the pain.  She denies any melena or hematochezia.  She denies any diarrhea.  Alcohol does irritate the pain and make it worse.  Sometimes food seems to make it worse.  Today she is tender to palpation of the right upper quadrant and also over the pancreas.  She denies any chest pain.  She denies any shortness of breath.  She denies any nausea or vomiting.  She denies any hematemesis.  She is on Eliquis for atrial fibrillation.  Today on exam she is in normal sinus rhythm however.  She denies any dysuria, urgency, or frequency.  She has a history of a hysterectomy.  It has been several years since her last colonoscopy.  A CT scan of her abdomen in 2016 did reveal numerous diverticula. Past Medical History:  Diagnosis Date  . Arthritis   . Atrial fibrillation (HCC)   . Chest pain    a. 2014: NST with no evidence of ischemia  . Diastolic dysfunction    grade 2 with dyspnea on exertion  . H/O echocardiogram    a. 2014: echo showing EF of 65-70% with Grade 1 DD, moderate TR, and mild to moderate MR  . Hyperlipidemia   . Hypertension   . Near syncope   . Osteopenia    Past Surgical History:  Procedure Laterality Date  . ABDOMINAL HYSTERECTOMY     TAH/BSO (menorrhagia)  . bunion repair    . HERNIA REPAIR     Current Outpatient Medications on File Prior to Visit  Medication Sig Dispense Refill  . amLODipine (NORVASC) 10 MG tablet Take 1 tablet (10 mg total) by mouth daily. 90 tablet 2  . apixaban (ELIQUIS) 5 MG TABS tablet Take  1 tablet (5 mg total) by mouth 2 (two) times daily. 180 tablet 3  . hydrochlorothiazide (HYDRODIURIL) 12.5 MG tablet Take 1 tablet (12.5 mg total) by mouth daily. 90 tablet 3  . LORazepam (ATIVAN) 1 MG tablet Take 1 tablet (1 mg total) by mouth 3 (three) times daily as needed for anxiety. 15 tablet 0  . losartan (COZAAR) 100 MG tablet Take 100 mg by mouth daily.     . metoprolol tartrate (LOPRESSOR) 50 MG tablet Take 1 tablet (50 mg total) by mouth 2 (two) times daily. 180 tablet 3  . Multiple Vitamins-Minerals (MULTIVITAMIN WITH MINERALS) tablet Take 1 tablet by mouth daily. Centrum Silver    . nitroGLYCERIN (NITROSTAT) 0.4 MG SL tablet Place 1 tablet (0.4 mg total) under the tongue every 5 (five) minutes as needed for chest pain. 20 tablet 0  . ondansetron (ZOFRAN-ODT) 4 MG disintegrating tablet Take 1 tablet (4 mg total) by mouth every 8 (eight) hours as needed for nausea or vomiting. 15 tablet 0  . rosuvastatin (CRESTOR) 10 MG tablet TAKE 1 TABLET BY MOUTH ONCE DAILY 90 tablet 3   No current facility-administered medications on file prior to visit.   Allergies  Allergen Reactions  . Codeine Nausea And  Vomiting    dizziness   Social History   Socioeconomic History  . Marital status: Divorced    Spouse name: Not on file  . Number of children: 5  . Years of education: 12th grade  . Highest education level: Not on file  Occupational History  . Occupation: Retired  Tobacco Use  . Smoking status: Never Smoker  . Smokeless tobacco: Never Used  Substance and Sexual Activity  . Alcohol use: Yes  . Drug use: No  . Sexual activity: Yes  Other Topics Concern  . Not on file  Social History Narrative   Lives with her son.   Right-handed.   Four living children.   Occasional caffeine use.   Social Determinants of Health   Financial Resource Strain:   . Difficulty of Paying Living Expenses:   Food Insecurity:   . Worried About Programme researcher, broadcasting/film/video in the Last Year:   . Engineer, site in the Last Year:   Transportation Needs:   . Freight forwarder (Medical):   Marland Kitchen Lack of Transportation (Non-Medical):   Physical Activity:   . Days of Exercise per Week:   . Minutes of Exercise per Session:   Stress:   . Feeling of Stress :   Social Connections:   . Frequency of Communication with Friends and Family:   . Frequency of Social Gatherings with Friends and Family:   . Attends Religious Services:   . Active Member of Clubs or Organizations:   . Attends Banker Meetings:   Marland Kitchen Marital Status:   Intimate Partner Violence:   . Fear of Current or Ex-Partner:   . Emotionally Abused:   Marland Kitchen Physically Abused:   . Sexually Abused:      Review of Systems  All other systems reviewed and are negative.      Objective:   Physical Exam Vitals reviewed.  Constitutional:      General: She is not in acute distress.    Appearance: Normal appearance. She is normal weight. She is not ill-appearing, toxic-appearing or diaphoretic.  Eyes:     Extraocular Movements: Extraocular movements intact.     Pupils: Pupils are equal, round, and reactive to light.  Cardiovascular:     Rate and Rhythm: Normal rate and regular rhythm.     Pulses: Normal pulses.     Heart sounds: Normal heart sounds. No murmur. No friction rub. No gallop.   Pulmonary:     Effort: Pulmonary effort is normal. No respiratory distress.     Breath sounds: Normal breath sounds. No stridor. No wheezing, rhonchi or rales.  Abdominal:     General: Abdomen is flat. Bowel sounds are normal.     Palpations: Abdomen is soft.    Neurological:     General: No focal deficit present.     Mental Status: She is alert and oriented to person, place, and time. Mental status is at baseline.     Cranial Nerves: No cranial nerve deficit.     Sensory: No sensory deficit.     Motor: No weakness.     Coordination: Coordination normal.     Gait: Gait normal.     Deep Tendon Reflexes: Reflexes normal.    Psychiatric:        Mood and Affect: Mood normal.        Behavior: Behavior normal.        Thought Content: Thought content normal.        Judgment: Judgment  normal.           Assessment & Plan:  Epigastric pain - Plan: CBC with Differential/Platelet, COMPLETE METABOLIC PANEL WITH GFR, Lipase  Differential diagnosis includes gastritis, peptic ulcer disease, biliary colic, pancreatitis.  Less likely would be IBS or diverticulitis.  I favor either gastritis or pancreatitis given her past medical history.  I will start the patient on Protonix 40 mg a day.  I recommended complete abstinence from alcohol.  I will check a CBC, CMP, and lipase.  If liver tests or lipase are elevated, I would recommend imaging of the abdomen and pelvis to evaluate further.  If lab work is completely normal, we may allow Protonix a therapeutic trial of 1 week to see if it is beneficial.  Continue to encourage abstinence from alcohol.

## 2020-03-10 ENCOUNTER — Other Ambulatory Visit: Payer: Self-pay | Admitting: Family Medicine

## 2020-03-10 DIAGNOSIS — R944 Abnormal results of kidney function studies: Secondary | ICD-10-CM

## 2020-03-10 DIAGNOSIS — N183 Chronic kidney disease, stage 3 unspecified: Secondary | ICD-10-CM

## 2020-03-12 ENCOUNTER — Other Ambulatory Visit: Payer: Medicare Other

## 2020-03-12 ENCOUNTER — Other Ambulatory Visit: Payer: Self-pay

## 2020-03-12 DIAGNOSIS — R944 Abnormal results of kidney function studies: Secondary | ICD-10-CM | POA: Diagnosis not present

## 2020-03-13 LAB — BASIC METABOLIC PANEL
BUN/Creatinine Ratio: 14 (calc) (ref 6–22)
BUN: 23 mg/dL (ref 7–25)
CO2: 29 mmol/L (ref 20–32)
Calcium: 10.5 mg/dL — ABNORMAL HIGH (ref 8.6–10.4)
Chloride: 103 mmol/L (ref 98–110)
Creat: 1.64 mg/dL — ABNORMAL HIGH (ref 0.60–0.93)
Glucose, Bld: 109 mg/dL — ABNORMAL HIGH (ref 65–99)
Potassium: 4.1 mmol/L (ref 3.5–5.3)
Sodium: 139 mmol/L (ref 135–146)

## 2020-03-15 ENCOUNTER — Encounter: Payer: Self-pay | Admitting: Family Medicine

## 2020-03-15 DIAGNOSIS — N183 Chronic kidney disease, stage 3 unspecified: Secondary | ICD-10-CM | POA: Insufficient documentation

## 2020-05-13 ENCOUNTER — Emergency Department (HOSPITAL_COMMUNITY): Payer: Medicare Other

## 2020-05-13 ENCOUNTER — Other Ambulatory Visit: Payer: Self-pay

## 2020-05-13 ENCOUNTER — Emergency Department (HOSPITAL_COMMUNITY)
Admission: EM | Admit: 2020-05-13 | Discharge: 2020-05-13 | Disposition: A | Discharge status: Home / Self Care | Payer: Medicare Other | Attending: Emergency Medicine | Admitting: Emergency Medicine

## 2020-05-13 ENCOUNTER — Encounter (HOSPITAL_COMMUNITY): Payer: Self-pay | Admitting: *Deleted

## 2020-05-13 DIAGNOSIS — I129 Hypertensive chronic kidney disease with stage 1 through stage 4 chronic kidney disease, or unspecified chronic kidney disease: Secondary | ICD-10-CM | POA: Insufficient documentation

## 2020-05-13 DIAGNOSIS — Z7901 Long term (current) use of anticoagulants: Secondary | ICD-10-CM | POA: Diagnosis not present

## 2020-05-13 DIAGNOSIS — R4789 Other speech disturbances: Secondary | ICD-10-CM | POA: Diagnosis not present

## 2020-05-13 DIAGNOSIS — R4781 Slurred speech: Secondary | ICD-10-CM | POA: Diagnosis not present

## 2020-05-13 DIAGNOSIS — Y908 Blood alcohol level of 240 mg/100 ml or more: Secondary | ICD-10-CM | POA: Insufficient documentation

## 2020-05-13 DIAGNOSIS — F439 Reaction to severe stress, unspecified: Secondary | ICD-10-CM | POA: Insufficient documentation

## 2020-05-13 DIAGNOSIS — R479 Unspecified speech disturbances: Secondary | ICD-10-CM | POA: Diagnosis not present

## 2020-05-13 DIAGNOSIS — Z79899 Other long term (current) drug therapy: Secondary | ICD-10-CM | POA: Insufficient documentation

## 2020-05-13 DIAGNOSIS — F4329 Adjustment disorder with other symptoms: Secondary | ICD-10-CM

## 2020-05-13 DIAGNOSIS — G9389 Other specified disorders of brain: Secondary | ICD-10-CM | POA: Diagnosis not present

## 2020-05-13 DIAGNOSIS — N183 Chronic kidney disease, stage 3 unspecified: Secondary | ICD-10-CM | POA: Diagnosis not present

## 2020-05-13 DIAGNOSIS — I480 Paroxysmal atrial fibrillation: Secondary | ICD-10-CM | POA: Insufficient documentation

## 2020-05-13 DIAGNOSIS — I10 Essential (primary) hypertension: Secondary | ICD-10-CM | POA: Diagnosis not present

## 2020-05-13 DIAGNOSIS — F10920 Alcohol use, unspecified with intoxication, uncomplicated: Secondary | ICD-10-CM | POA: Insufficient documentation

## 2020-05-13 DIAGNOSIS — I6782 Cerebral ischemia: Secondary | ICD-10-CM | POA: Diagnosis not present

## 2020-05-13 DIAGNOSIS — I672 Cerebral atherosclerosis: Secondary | ICD-10-CM | POA: Diagnosis not present

## 2020-05-13 DIAGNOSIS — G459 Transient cerebral ischemic attack, unspecified: Secondary | ICD-10-CM | POA: Diagnosis not present

## 2020-05-13 DIAGNOSIS — I639 Cerebral infarction, unspecified: Secondary | ICD-10-CM | POA: Diagnosis not present

## 2020-05-13 LAB — RAPID URINE DRUG SCREEN, HOSP PERFORMED
Amphetamines: NOT DETECTED
Barbiturates: NOT DETECTED
Benzodiazepines: NOT DETECTED
Cocaine: NOT DETECTED
Opiates: NOT DETECTED
Tetrahydrocannabinol: NOT DETECTED

## 2020-05-13 LAB — DIFFERENTIAL
Abs Immature Granulocytes: 0.01 10*3/uL (ref 0.00–0.07)
Basophils Absolute: 0 10*3/uL (ref 0.0–0.1)
Basophils Relative: 1 %
Eosinophils Absolute: 0.1 10*3/uL (ref 0.0–0.5)
Eosinophils Relative: 2 %
Immature Granulocytes: 0 %
Lymphocytes Relative: 58 %
Lymphs Abs: 2.6 10*3/uL (ref 0.7–4.0)
Monocytes Absolute: 0.5 10*3/uL (ref 0.1–1.0)
Monocytes Relative: 11 %
Neutro Abs: 1.3 10*3/uL — ABNORMAL LOW (ref 1.7–7.7)
Neutrophils Relative %: 28 %

## 2020-05-13 LAB — COMPREHENSIVE METABOLIC PANEL
ALT: 20 U/L (ref 0–44)
AST: 26 U/L (ref 15–41)
Albumin: 4.1 g/dL (ref 3.5–5.0)
Alkaline Phosphatase: 59 U/L (ref 38–126)
Anion gap: 11 (ref 5–15)
BUN: 21 mg/dL (ref 8–23)
CO2: 24 mmol/L (ref 22–32)
Calcium: 9.6 mg/dL (ref 8.9–10.3)
Chloride: 104 mmol/L (ref 98–111)
Creatinine, Ser: 2.07 mg/dL — ABNORMAL HIGH (ref 0.44–1.00)
GFR calc Af Amer: 27 mL/min — ABNORMAL LOW (ref >60)
GFR calc non Af Amer: 23 mL/min — ABNORMAL LOW (ref >60)
Glucose, Bld: 117 mg/dL — ABNORMAL HIGH (ref 70–99)
Potassium: 4.4 mmol/L (ref 3.5–5.1)
Sodium: 139 mmol/L (ref 135–145)
Total Bilirubin: 0.7 mg/dL (ref 0.3–1.2)
Total Protein: 7.1 g/dL (ref 6.5–8.1)

## 2020-05-13 LAB — I-STAT CHEM 8, ED
BUN: 23 mg/dL (ref 8–23)
Calcium, Ion: 1.22 mmol/L (ref 1.15–1.40)
Chloride: 103 mmol/L (ref 98–111)
Creatinine, Ser: 2.5 mg/dL — ABNORMAL HIGH (ref 0.44–1.00)
Glucose, Bld: 112 mg/dL — ABNORMAL HIGH (ref 70–99)
HCT: 36 % (ref 36.0–46.0)
Hemoglobin: 12.2 g/dL (ref 12.0–15.0)
Potassium: 4.2 mmol/L (ref 3.5–5.1)
Sodium: 143 mmol/L (ref 135–145)
TCO2: 25 mmol/L (ref 22–32)

## 2020-05-13 LAB — CBC
HCT: 36.4 % (ref 36.0–46.0)
Hemoglobin: 11.2 g/dL — ABNORMAL LOW (ref 12.0–15.0)
MCH: 28.2 pg (ref 26.0–34.0)
MCHC: 30.8 g/dL (ref 30.0–36.0)
MCV: 91.7 fL (ref 80.0–100.0)
Platelets: 167 10*3/uL (ref 150–400)
RBC: 3.97 MIL/uL (ref 3.87–5.11)
RDW: 14.7 % (ref 11.5–15.5)
WBC: 4.5 10*3/uL (ref 4.0–10.5)
nRBC: 0 % (ref 0.0–0.2)

## 2020-05-13 LAB — ETHANOL: Alcohol, Ethyl (B): 277 mg/dL — ABNORMAL HIGH (ref <10)

## 2020-05-13 LAB — PROTIME-INR
INR: 1.3 — ABNORMAL HIGH (ref 0.8–1.2)
Prothrombin Time: 15.5 seconds — ABNORMAL HIGH (ref 11.4–15.2)

## 2020-05-13 LAB — CBG MONITORING, ED: Glucose-Capillary: 89 mg/dL (ref 70–99)

## 2020-05-13 LAB — AMMONIA: Ammonia: 9 umol/L (ref 9–35)

## 2020-05-13 LAB — APTT: aPTT: 33 seconds (ref 24–36)

## 2020-05-13 MED ORDER — SODIUM CHLORIDE 0.9% FLUSH: 3.0000 mL | Freq: Once | INTRAVENOUS | Status: DC

## 2020-05-13 NOTE — Consult Note (Addendum)
Neurology Consultation  Reason for Consult: Speech difficulty/code stroke Referring Physician: Dr. Juleen China  CC: Speech difficulty/code stroke  History is obtained from: EMS  HPI: Christine Hogan is a 74 y.o. female with past medical history of hypertension, hyperlipidemia, atrial fibrillation on Eliquis, chronic kidney disease and alcoholism.  At 1150 this morning family noted that patient had a change in her gait and speech.  Fire department was called to the residence.  At the time they arrived her speech had cleared.  Patient was not brought to the hospital as EMS was canceled.  Later in the afternoon at approximately 1:00 they noted that her speech again started to change.  For that reason EMS was called out patient was brought to the hospital as code stroke.  In route her blood pressure was 140 systolic and her blood glucose is 136.  At the bridge patient had pressured/stuttering speech with no other localizing or lateralizing stroke symptoms.  Patient was then brought immediately to CT to evaluate for intracranial hemorrhage.  Patient did admit to being under stress as her son and girlfriend have moved in with their and at this point it is stressing her out and she desires them to move out.  LKW: 1240 tpa given?: no, on Eliquis and too good to treat Premorbid modified Rankin scale (mRS): 0 NIH stroke Score: 0  She denies any prior history of strokes, TIAs, seizures or any significant neurological problems she does have history of atrial fibrillation and is on long-term Eliquis and stated she was compliant with it.  Past Medical History:  Diagnosis Date   Arthritis    Atrial fibrillation (HCC)    Chest pain    a. 2014: NST with no evidence of ischemia   CKD (chronic kidney disease) stage 3, GFR 30-59 ml/min    Diastolic dysfunction    grade 2 with dyspnea on exertion   H/O echocardiogram    a. 2014: echo showing EF of 65-70% with Grade 1 DD, moderate TR, and mild to moderate MR    Hyperlipidemia    Hypertension    Near syncope    Osteopenia     Family History  Problem Relation Age of Onset   Hypertension Father    Heart disease Father        pacemaker- followedby Dr. Royann Shivers   Diabetes Sister    Hypertension Sister    Diabetes Sister    Hypertension Sister    Hypertension Daughter    Hypertension Daughter    Kidney failure Mother    Social History:   reports that she has never smoked. She has never used smokeless tobacco. She reports current alcohol use. She reports that she does not use drugs.  Medications  Current Facility-Administered Medications:    sodium chloride flush (NS) 0.9 % injection 3 mL, 3 mL, Intravenous, Once, Cathren Laine, MD  Current Outpatient Medications:    amLODipine (NORVASC) 10 MG tablet, Take 1 tablet (10 mg total) by mouth daily., Disp: 90 tablet, Rfl: 2   apixaban (ELIQUIS) 5 MG TABS tablet, Take 1 tablet (5 mg total) by mouth 2 (two) times daily., Disp: 180 tablet, Rfl: 3   hydrochlorothiazide (HYDRODIURIL) 12.5 MG tablet, Take 1 tablet (12.5 mg total) by mouth daily., Disp: 90 tablet, Rfl: 3   LORazepam (ATIVAN) 1 MG tablet, Take 1 tablet (1 mg total) by mouth 3 (three) times daily as needed for anxiety., Disp: 15 tablet, Rfl: 0   losartan (COZAAR) 100 MG tablet, Take 100 mg  by mouth daily. , Disp: , Rfl:    metoprolol tartrate (LOPRESSOR) 50 MG tablet, Take 1 tablet (50 mg total) by mouth 2 (two) times daily., Disp: 180 tablet, Rfl: 3   Multiple Vitamins-Minerals (MULTIVITAMIN WITH MINERALS) tablet, Take 1 tablet by mouth daily. Centrum Silver, Disp: , Rfl:    nitroGLYCERIN (NITROSTAT) 0.4 MG SL tablet, Place 1 tablet (0.4 mg total) under the tongue every 5 (five) minutes as needed for chest pain., Disp: 20 tablet, Rfl: 0   ondansetron (ZOFRAN-ODT) 4 MG disintegrating tablet, Take 1 tablet (4 mg total) by mouth every 8 (eight) hours as needed for nausea or vomiting., Disp: 15 tablet, Rfl: 0   pantoprazole (PROTONIX) 40 MG  tablet, Take 1 tablet (40 mg total) by mouth daily., Disp: 30 tablet, Rfl: 3   rosuvastatin (CRESTOR) 10 MG tablet, TAKE 1 TABLET BY MOUTH ONCE DAILY, Disp: 90 tablet, Rfl: 3  ROS:   General ROS: negative for - chills, fatigue, fever, night sweats, weight gain or weight loss Psychological ROS: Positive for -stress Ophthalmic ROS: negative for - blurry vision, double vision, eye pain or loss of vision ENT ROS: negative for - epistaxis, nasal discharge, oral lesions, sore throat, tinnitus or vertigo Allergy and Immunology ROS: negative for - hives or itchy/watery eyes Hematological and Lymphatic ROS: negative for - bleeding problems, bruising or swollen lymph nodes Endocrine ROS: negative for - galactorrhea, hair pattern changes, polydipsia/polyuria or temperature intolerance Respiratory ROS: negative for - cough, hemoptysis, shortness of breath or wheezing Cardiovascular ROS: negative for - chest pain, dyspnea on exertion, edema or irregular heartbeat Gastrointestinal ROS: negative for - abdominal pain, diarrhea, hematemesis, nausea/vomiting or stool incontinence Genito-Urinary ROS: negative for - dysuria, hematuria, incontinence or urinary frequency/urgency Musculoskeletal ROS: negative for - joint swelling or muscular weakness Neurological ROS: as noted in HPI Dermatological ROS: negative for rash and skin lesion changes  Exam: Current vital signs: There were no vitals taken for this visit. Vital signs in last 24 hours:     Constitutional: Pleasant mildly obese middle-aged African-American lady Eyes: No scleral injection HENT: No OP obstrucion Head: Normocephalic.  Cardiovascular: Palpable.  Respiratory: Effort normal, non-labored breathing GI: Soft.  No distension. There is no tenderness.  Skin: WDI  Neuro: Mental Status: Patient is awake, alert, oriented to person, place, month, year, and situation. Speech-shows intact naming, repeating, comprehension.  Speech also is  stuttering in quality.  At times she speaks clear sentences at times she is clearly pausing and trying to speak in a very effortful way. Patient is able to follow commands with no difficulty Cranial Nerves: II: Visual Fields are full.  III,IV, VI: EOMI without ptosis or diploplia. Pupils equal, round and reactive to light V: Facial sensation is symmetric to temperature VII: Facial movement is symmetric.  VIII: hearing is intact to voice X: Palat elevates symmetrically XI: Shoulder shrug is symmetric. XII: tongue is midline without atrophy or fasciculations.  Motor: Tone is normal. Bulk is normal. 5/5 strength was present in all four extremities.  No drift Sensory: Sensation is symmetric to light touch and temperature in the arms and legs. Deep Tendon Reflexes: 2+ and symmetric in the biceps and patellae.  Plantars: Toes are downgoing bilaterally.  Cerebellar: FNF and HKS are intact bilaterally  Labs I have reviewed labs in epic and the results pertinent to this consultation are:   CBC    Component Value Date/Time   WBC 4.5 05/13/2020 1327   RBC 3.97 05/13/2020 1327   HGB  12.2 05/13/2020 1330   HCT 36.0 05/13/2020 1330   PLT 167 05/13/2020 1327   MCV 91.7 05/13/2020 1327   MCV 88.2 03/16/2012 1611   MCH 28.2 05/13/2020 1327   MCHC 30.8 05/13/2020 1327   RDW 14.7 05/13/2020 1327   LYMPHSABS 2.6 05/13/2020 1327   MONOABS 0.5 05/13/2020 1327   EOSABS 0.1 05/13/2020 1327   BASOSABS 0.0 05/13/2020 1327    CMP     Component Value Date/Time   NA 143 05/13/2020 1330   NA 145 (H) 10/08/2018 1140   K 4.2 05/13/2020 1330   CL 103 05/13/2020 1330   CO2 29 03/12/2020 1054   GLUCOSE 112 (H) 05/13/2020 1330   BUN 23 05/13/2020 1330   BUN 22 10/08/2018 1140   CREATININE 2.50 (H) 05/13/2020 1330   CREATININE 1.64 (H) 03/12/2020 1054   CALCIUM 10.5 (H) 03/12/2020 1054   PROT 7.2 03/08/2020 0918   PROT 7.2 10/08/2018 1140   ALBUMIN 4.2 01/06/2020 0629   ALBUMIN 4.5  10/08/2018 1140   AST 19 03/08/2020 0918   ALT 17 03/08/2020 0918   ALKPHOS 69 01/06/2020 0629   BILITOT 1.0 03/08/2020 0918   BILITOT 0.7 10/08/2018 1140   GFRNONAA 30 (L) 03/08/2020 0918   GFRAA 35 (L) 03/08/2020 0918    Lipid Panel     Component Value Date/Time   CHOL 148 10/08/2018 1140   CHOL 191 07/10/2013 0832   TRIG 175 (H) 10/08/2018 1140   TRIG 210 (H) 07/10/2013 0832   HDL 57 10/08/2018 1140   HDL 52 07/10/2013 0832   CHOLHDL 2.6 10/08/2018 1140   CHOLHDL 5.1 (H) 12/24/2017 1017   VLDL 50 (H) 05/07/2014 0852   LDLCALC 56 10/08/2018 1140   LDLCALC 158 (H) 12/24/2017 1017   LDLCALC 97 07/10/2013 0832     Imaging I have reviewed the images obtained:  CT-scan of the brain-no acute intracranial hemorrhage or evidence of acute infarction  Felicie Mornavid Smith PA-C [facility] [phone]  M-F  (9:00 am- 5:00 PM)  05/13/2020, 1:47 PM     Assessment:  This is a 74 year old female [facility] as a code stroke secondary to speech difficulties.  At time of hospitalization CT scan was obtained showing no intracranial bleed and exam showing nonorganic speech characteristics with no other neurological deficits.  Impression: -Speech difficulties which on exam showed to be nonorganic and pressured.  Recommendations: As patient symptoms are only difficulty with speech and exam shows speech to be none-organic/pressured in quality and  no other localizing or lateralizing stroke characteristics.  At this point, would not recommend any further stroke related work-up  from a neurological standpoint.  In addition patient is already on Eliquis and would not change to any further anticoagulant. ATTENDING ATTESTATION : I have personally obtained history,examined this patient, reviewed notes, independently viewed imaging studies, participated in medical decision making and plan of care.ROS completed by me personally and pertinent positives fully documented   I have made any additions or clarifications directly to the above note. Agree with note above.  She has presented with intermittent transient speech difficulties mostly speech hesitation and increased effort with no other focal neurological symptoms.  Neurological exam shows only intermittent nonorganic expressive speech difficulties without facial weakness or other focal deficits.  CT of the head is unremarkable.  Agree with no further stroke work-up at this time and recommend underlying psychosocial stressors be addressed.  Continue Eliquis for stroke prevention for A. fib.  Long discussion with  patient and with ER physician and answered questions.  Greater than 50% time during this 60-minute consultation visit was spent on counseling and coordination of care about her speech difficulties and discussion about stroke and atrial fibrillation and stroke prevention  Delia Heady, MD Medical Director [facility] Pager: [phone] 05/13/2020 2:20 PM

## 2020-05-13 NOTE — ED Notes (Signed)
Help get patient undress on the monitor did ekg shown to dr Juleen China patient is resting with call bell in reach and nurse at bedside

## 2020-05-13 NOTE — ED Triage Notes (Signed)
Patient presents to ed via [facility] states she was at a party last pm and drank a lot of alcohol, today at 1150 famly noticed patient had slurred speech, called 911, fire dept. Arrival and all sx. Had totally resolved. 1240 speech became slurred again and ems was called. Upon arrival to ed patient is alert oriented, some words are very clear and others are slurred however appears to be forced slurred. Moves all ext. X 4.

## 2020-05-13 NOTE — ED Notes (Signed)
Code stroke cancelled 

## 2020-05-13 NOTE — ED Notes (Addendum)
Patient states she has been under lots of stress lately . Admits to ETOH today

## 2020-05-13 NOTE — ED Provider Notes (Signed)
[facility]   CSN: [id number] Arrival date & time: 05/13/20  1323  An emergency department physician performed an initial assessment on this suspected stroke patient at 1328.  History Chief Complaint  Patient presents with  . Code Stroke    Christine Hogan is a 74 y.o. female who presents emergency department via EMS as a code stroke.  Patient's last seen normal was 12:40 PM.  History gathered from the patient, the code stroke team at bedside.  Her airway was cleared at the bridge by Dr. Juleen ChinaKohut.  Apparently around 11:30 AM the patient had abnormal speech, EMS was called to the house however it resolved and she decided not to be evaluated.  She had return of her symptoms at 12:40 PM and was transferred ported as a code stroke.  She has no other abnormal neurologic symptoms.  She admits to drinking alcohol heavily.  She is already had alcohol to drink this morning.  The patient states that her speech has never been like this.  Her speech became abnormal during the onset of conflict at her house.  She states that her son and his girlfriend are living at the house and she "needs them out."  She is currently already on Eliquis and is compliant with her medication.  She is on Eliquis for paroxysmal atrial fibrillation.  Patient had normal MRA of the brain and neck in December 2020  HPI     Past Medical History:  Diagnosis Date  . Arthritis   . Atrial fibrillation (HCC)   . Chest pain    a. 2014: NST with no evidence of ischemia  . CKD (chronic kidney disease) stage 3, GFR 30-59 ml/min   . Diastolic dysfunction    grade 2 with dyspnea on exertion  . H/O echocardiogram    a. 2014: echo showing EF of 65-70% with Grade 1 DD, moderate TR, and mild to moderate MR  . Hyperlipidemia   . Hypertension   . Near syncope   . Osteopenia     Patient Active Problem List   Diagnosis Date Noted  . CKD (chronic kidney disease) stage 3, GFR 30-59 ml/min    . Abnormal finding on MRI of brain 09/15/2019  . Alteration of consciousness 08/26/2019  . Syncope 05/26/2019  . Nonrheumatic mitral (valve) insufficiency 10/08/2018  . Vasovagal syncope 10/08/2018  . Osteopenia   . Atrial fibrillation (HCC)   . Diastolic dysfunction   . Paroxysmal atrial fibrillation (HCC) 02/09/2017  . Long term current use of anticoagulant 02/09/2017  . Chronic renal insufficiency 06/28/2013  . Chest pain 06/27/2013  . Essential hypertension   . Mixed hyperlipidemia     Past Surgical History:  Procedure Laterality Date  . ABDOMINAL HYSTERECTOMY     TAH/BSO (menorrhagia)  . bunion repair    . HERNIA REPAIR       OB History   No obstetric history on file.     Family History  Problem Relation Age of Onset  . Hypertension Father   . Heart disease Father        pacemaker- followedby Dr. Royann Shiverscroitoru  . Diabetes Sister   . Hypertension Sister   . Diabetes Sister   . Hypertension Sister   . Hypertension Daughter   . Hypertension Daughter   . Kidney failure Mother     Social History   Tobacco Use  . Smoking status: Never Smoker  . Smokeless tobacco: Never Used  Vaping Use  . Vaping  Use: Never used  Substance Use Topics  . Alcohol use: Yes  . Drug use: No    Home Medications Prior to Admission medications   Medication Sig Start Date End Date Taking? Authorizing Provider  amLODipine (NORVASC) 10 MG tablet Take 1 tablet (10 mg total) by mouth daily. 04/24/19  Yes Donita Brooks, MD  apixaban (ELIQUIS) 5 MG TABS tablet Take 1 tablet (5 mg total) by mouth 2 (two) times daily. 05/27/19  Yes Croitoru, Mihai, MD  hydrochlorothiazide (HYDRODIURIL) 12.5 MG tablet Take 1 tablet (12.5 mg total) by mouth daily. 05/16/19  Yes Croitoru, Mihai, MD  LORazepam (ATIVAN) 1 MG tablet Take 1 tablet (1 mg total) by mouth 3 (three) times daily as needed for anxiety. 01/06/20  Yes Vanetta Mulders, MD  losartan (COZAAR) 100 MG tablet Take 100 mg by mouth daily.  08/24/19   Yes [provider]  metoprolol tartrate (LOPRESSOR) 50 MG tablet Take 1 tablet (50 mg total) by mouth 2 (two) times daily. 05/16/19  Yes Croitoru, Mihai, MD  Multiple Vitamins-Minerals (MULTIVITAMIN WITH MINERALS) tablet Take 1 tablet by mouth daily. Centrum Silver   Yes [provider]  nitroGLYCERIN (NITROSTAT) 0.4 MG SL tablet Place 1 tablet (0.4 mg total) under the tongue every 5 (five) minutes as needed for chest pain. 12/11/13  Yes [place], Velna Hatchet, MD  ondansetron (ZOFRAN-ODT) 4 MG disintegrating tablet Take 1 tablet (4 mg total) by mouth every 8 (eight) hours as needed for nausea or vomiting. 08/21/19  Yes Hagler, Arlys John, MD  pantoprazole (PROTONIX) 40 MG tablet Take 1 tablet (40 mg total) by mouth daily. 03/08/20  Yes Donita Brooks, MD  rosuvastatin (CRESTOR) 10 MG tablet TAKE 1 TABLET BY MOUTH ONCE DAILY Patient taking differently: Take 10 mg by mouth daily.  04/24/19  Yes Donita Brooks, MD    Allergies    Codeine  Review of Systems   Review of Systems Ten systems reviewed and are negative for acute change, except as noted in the HPI.   Physical Exam Updated Vital Signs BP 125/71   Pulse 63   Temp 97.7 F (36.5 C) (Oral)   Resp (!) 23   Ht 5' 5.5" (1.664 m)   Wt 72.6 kg   SpO2 99%   BMI 26.22 kg/m   Physical Exam Vitals and nursing note reviewed.  Constitutional:      General: She is not in acute distress.    Appearance: She is well-developed. She is not diaphoretic.  HENT:     Head: Normocephalic and atraumatic.  Eyes:     General: No scleral icterus.    Conjunctiva/sclera: Conjunctivae normal.  Cardiovascular:     Rate and Rhythm: Normal rate and regular rhythm.     Heart sounds: Normal heart sounds. No murmur heard.  No friction rub. No gallop.   Pulmonary:     Effort: Pulmonary effort is normal. No respiratory distress.     Breath sounds: Normal breath sounds.  Abdominal:     General: Bowel sounds are normal. There is no distension.      Palpations: Abdomen is soft. There is no mass.     Tenderness: There is no abdominal tenderness. There is no guarding.  Musculoskeletal:     Cervical back: Normal range of motion.  Skin:    General: Skin is warm and dry.  Neurological:     Mental Status: She is alert and oriented to person, place, and time.     Comments: At times the  patient's speech is stuttered and halting however when she is focused on another task her speech is very clear and goal oriented Major Cranial nerves without deficit, no facial droop Normal strength in upper and lower extremities bilaterally including dorsiflexion and plantar flexion, strong and equal grip strength Sensation normal to light and sharp touch Moves extremities without ataxia, coordination intact Normal finger to nose and rapid alternating movements no pronator drift Normal heel-shin  Psychiatric:        Behavior: Behavior normal.     ED Results / Procedures / Treatments   Labs (all labs ordered are listed, but only abnormal results are displayed) Labs Reviewed  PROTIME-INR - Abnormal; Notable for the following components:      Result Value   Prothrombin Time 15.5 (*)    INR 1.3 (*)    All other components within normal limits  CBC - Abnormal; Notable for the following components:   Hemoglobin 11.2 (*)    All other components within normal limits  DIFFERENTIAL - Abnormal; Notable for the following components:   Neutro Abs 1.3 (*)    All other components within normal limits  COMPREHENSIVE METABOLIC PANEL - Abnormal; Notable for the following components:   Glucose, Bld 117 (*)    Creatinine, Ser 2.07 (*)    GFR calc non Af Amer 23 (*)    GFR calc Af Amer 27 (*)    All other components within normal limits  ETHANOL - Abnormal; Notable for the following components:   Alcohol, Ethyl (B) 277 (*)    All other components within normal limits  I-STAT CHEM 8, ED - Abnormal; Notable for the following components:   Creatinine, Ser 2.50  (*)    Glucose, Bld 112 (*)    All other components within normal limits  APTT  RAPID URINE DRUG SCREEN, HOSP PERFORMED  AMMONIA  CBG MONITORING, ED    EKG EKG Interpretation  Date/Time:  Thursday May 13 2020 13:50:56 EDT Ventricular Rate:  71 PR Interval:    QRS Duration: 96 QT Interval:  388 QTC Calculation: 422 R Axis:   22 Text Interpretation: Sinus rhythm Consider right atrial enlargement Probable anteroseptal infarct, old Baseline wander in lead(s) V5 Confirmed by Raeford Razor [phone]) on 05/13/2020 3:18:38 PM   Radiology CT HEAD CODE STROKE WO CONTRAST  Result Date: 05/13/2020 CLINICAL DATA:  Code stroke.  Slurred speech EXAM: CT HEAD WITHOUT CONTRAST TECHNIQUE: Contiguous axial images were obtained from the base of the skull through the vertex without intravenous contrast. COMPARISON:  2007 FINDINGS: Brain: There is no acute intracranial hemorrhage, mass effect, or edema. Gray-white differentiation is preserved. Minimal patchy hypoattenuation in the supratentorial white matter is nonspecific but may reflect minor chronic microvascular ischemic changes. Ventricles and sulci are within normal limits in size and configuration. There is no extra-axial fluid collection. Vascular: No hyperdense vessel. There is intracranial atherosclerotic calcification at the skull base. Skull: Unremarkable. Sinuses/Orbits: No acute abnormality. Other: Mastoid air cells are clear. ASPECTS ([place] Stroke Program Early CT Score) - Ganglionic level infarction (caudate, lentiform nuclei, internal capsule, insula, M1-M3 cortex): 7 - Supraganglionic infarction (M4-M6 cortex): 3 Total score (0-10 with 10 being normal): 10 IMPRESSION: No acute intracranial hemorrhage or evidence of acute infarction. ASPECT score is 10. These results were communicated to Dr. Pearlean Brownie at 1:44 pmon 7/8/2021by text page via the [facility] messaging system. Electronically Signed   By: Guadlupe Spanish M.D.   On: 05/13/2020 13:47     Procedures Procedures (including critical care  time)  Medications Ordered in ED Medications - No data to display  ED Course  I have reviewed the triage vital signs and the nursing notes.  Pertinent labs & imaging results that were available during my care of the patient were reviewed by me and considered in my medical decision making (see chart for details).  Clinical Course as of May 13 2121  Thu May 13, 2020  1357 Code stroke discontinued by Dr. Pearlean Brownie All providers in agreement that this appears to be psychogenic in nature. Patient also is maximized on Eliquis and is compliant with this medication. Her speech is clear and goal oriented when she is distracted or focused on another task. Dr. Pearlean Brownie states observe and continue work up.  No further imaging indicated at this time. I agree with plan   [AH]  1515 Alcohol, Ethyl (B)(!): 277 [AH]  1516 Creatinine at baseline   Creatinine(!): 2.07 [AH]    Clinical Course User Index [AH] Arthor Captain, PA-C   MDM Rules/Calculators/A&P                         CC: Of speech difficulty VS:  Vitals:   05/13/20 1356 05/13/20 1407 05/13/20 1408 05/13/20 1515  BP:  (!) 159/89  125/71  Pulse:  71  63  Resp:  18  (!) 23  Temp:  97.7 F (36.5 C)    TempSrc:  Oral    SpO2: 97% 99%  99%  Weight:   72.6 kg   Height:   5' 5.5" (1.664 m)     SW:FUXNATF is gathered by the patient and EMS. Previous records obtained and reviewed. DDX:The patient's complaint of AMS involves an extensive number of diagnostic and treatment options, and is a complaint that carries with it a high risk of complications, morbidity, and potential mortality. Given the large differential diagnosis, medical decision making is of high complexity. The differential diagnosis for AMS is extensive and includes, but is not limited to: drug overdose - opioids, alcohol, sedatives, antipsychotics, drug withdrawal, others; Metabolic: hypoxia, hypoglycemia, hyperglycemia,  hypercalcemia, hypernatremia, hyponatremia, uremia, hepatic encephalopathy, hypothyroidism, hyperthyroidism, vitamin B12 or thiamine deficiency, carbon monoxide poisoning, Wilson's disease, Lactic acidosis, DKA/HHOS; Infectious: meningitis, encephalitis, bacteremia/sepsis, urinary tract infection, pneumonia, neurosyphilis; Structural: Space-occupying lesion, (brain tumor, subdural hematoma, hydrocephalus,); Vascular: stroke, subarachnoid hemorrhage, coronary ischemia, hypertensive encephalopathy, CNS vasculitis, thrombotic thrombocytopenic purpura, disseminated intravascular coagulation, hyperviscosity; Psychiatric: Schizophrenia, depression; Other: Seizure, hypothermia, heat stroke, ICU psychosis, dementia -"sundowning."  Labs: I ordered reviewed and interpreted labs which included UDS which is negative, ethanol level at 277, normal ammonia, CBG, a PTT.  PT/INR just above normal, CMP with baseline renal insufficiency unchanged, CBC showed mild normocytic anemia. Imaging: I ordered and reviewed images which included CT head. I independently visualized and interpreted all imaging. There are no acute, significant findings on today's images. EKG: Sinus rhythm at a rate of 71 Consults: Patient seen as a code stroke by the stroke response team and Dr. Pearlean Brownie. MDM: Patient seen and shared visit with Dr. Pearlean Brownie.  Patient with alcohol level of 277.  Given the clinical presentation Dr. Pearlean Brownie and I had a long discussion both agree that this patient does not need any further imaging for stroke at this time.  Her's clinical presentation is not consistent with a central cause of her speech.  Her speech is clear and goal oriented when she is distracted with a second task.  The patient's alcohol level or psychogenic stress response are very  likely the cause of her speech difficulties this morning.  Patient was observed, I have reviewed labs and imaging.  She is ambulatory to the bathroom several times without help, ataxia,  dizziness.  She appears clinically sober.  She appears appropriate for discharge at this time Patient disposition: Discharge The patient appears reasonably screened and/or stabilized for discharge and I doubt any other medical condition or other [facility] requiring further screening, evaluation, or treatment in the ED at this time prior to discharge. I have discussed lab and/or imaging findings with the patient and answered all questions/concerns to the best of my ability.I have discussed return precautions and OP follow up.    Final Clinical Impression(s) / ED Diagnoses Final diagnoses:  Transient speech disturbance  Alcoholic intoxication without complication (HCC)  Stress and adjustment reaction    Rx / DC Orders ED Discharge Orders    None       Arthor Captain, PA-C 05/15/20 1129    Raeford Razor, MD 05/15/20 1208

## 2020-05-13 NOTE — Discharge Instructions (Addendum)
Please follow closely with Dr. Terrace Arabia. Get help right away if: You have chest pain or an irregular heartbeat. You have any symptoms of stroke. The acronym BEFAST is an easy way to remember the main warning signs of stroke. B - Balance problems. Signs include dizziness, sudden trouble walking, or loss of balance. E - Eye problems. This includes trouble seeing or a sudden change in vision. F - Face changes. This includes sudden weakness or numbness of the face, or the face or eyelid drooping to one side. A - Arm weakness or numbness. This happens suddenly and usually on one side of the body. S - Speech problems. This includes trouble speaking or trouble understanding speech. T - Time. Time to call 911 or seek emergency care. Do not wait to see if symptoms will go away. Make note of the time your symptoms started. Other signs of stroke may include: A sudden, severe headache with no known cause. Nausea or vomiting. Seizure.

## 2020-05-22 DIAGNOSIS — H5203 Hypermetropia, bilateral: Secondary | ICD-10-CM | POA: Diagnosis not present

## 2020-05-27 ENCOUNTER — Ambulatory Visit: Payer: Medicare Other | Admitting: Cardiovascular Disease

## 2020-05-30 ENCOUNTER — Other Ambulatory Visit: Payer: Self-pay | Admitting: Family Medicine

## 2020-06-23 ENCOUNTER — Other Ambulatory Visit: Payer: Self-pay

## 2020-06-23 MED ORDER — AMLODIPINE BESYLATE 10 MG PO TABS: 10.0000 mg | ORAL_TABLET | Freq: Every day | ORAL | 2 refills | Status: DC

## 2020-06-30 ENCOUNTER — Other Ambulatory Visit: Payer: Self-pay | Admitting: Cardiovascular Disease

## 2020-07-14 ENCOUNTER — Other Ambulatory Visit: Payer: Self-pay

## 2020-07-14 ENCOUNTER — Ambulatory Visit: Payer: Medicare Other | Admitting: Cardiovascular Disease

## 2020-07-14 ENCOUNTER — Encounter: Payer: Self-pay | Admitting: Cardiovascular Disease

## 2020-07-14 VITALS — BP 122/67 | HR 63 | Ht 65.5 in | Wt 160.6 lb

## 2020-07-14 DIAGNOSIS — R55 Syncope and collapse: Secondary | ICD-10-CM

## 2020-07-14 DIAGNOSIS — E782 Mixed hyperlipidemia: Secondary | ICD-10-CM

## 2020-07-14 DIAGNOSIS — Z7901 Long term (current) use of anticoagulants: Secondary | ICD-10-CM | POA: Diagnosis not present

## 2020-07-14 DIAGNOSIS — I10 Essential (primary) hypertension: Secondary | ICD-10-CM

## 2020-07-14 DIAGNOSIS — N1832 Chronic kidney disease, stage 3b: Secondary | ICD-10-CM

## 2020-07-14 DIAGNOSIS — I480 Paroxysmal atrial fibrillation: Secondary | ICD-10-CM

## 2020-07-14 DIAGNOSIS — I340 Nonrheumatic mitral (valve) insufficiency: Secondary | ICD-10-CM

## 2020-07-14 LAB — LIPID PANEL
Chol/HDL Ratio: 2.9 ratio (ref 0.0–4.4)
Cholesterol, Total: 140 mg/dL (ref 100–199)
HDL: 48 mg/dL (ref >39)
LDL Chol Calc (NIH): 54 mg/dL (ref 0–99)
Triglycerides: 243 mg/dL — ABNORMAL HIGH (ref 0–149)
VLDL Cholesterol Cal: 38 mg/dL (ref 5–40)

## 2020-07-14 LAB — COMPREHENSIVE METABOLIC PANEL
ALT: 20 IU/L (ref 0–32)
AST: 19 IU/L (ref 0–40)
Albumin/Globulin Ratio: 1.6 (ref 1.2–2.2)
Albumin: 4.5 g/dL (ref 3.7–4.7)
Alkaline Phosphatase: 74 IU/L (ref 48–121)
BUN/Creatinine Ratio: 14 (ref 12–28)
BUN: 23 mg/dL (ref 8–27)
Bilirubin Total: 0.7 mg/dL (ref 0.0–1.2)
CO2: 27 mmol/L (ref 20–29)
Calcium: 10.9 mg/dL — ABNORMAL HIGH (ref 8.7–10.3)
Chloride: 101 mmol/L (ref 96–106)
Creatinine, Ser: 1.59 mg/dL — ABNORMAL HIGH (ref 0.57–1.00)
GFR calc Af Amer: 37 mL/min/{1.73_m2} — ABNORMAL LOW (ref >59)
GFR calc non Af Amer: 32 mL/min/{1.73_m2} — ABNORMAL LOW (ref >59)
Globulin, Total: 2.9 g/dL (ref 1.5–4.5)
Glucose: 92 mg/dL (ref 65–99)
Potassium: 5 mmol/L (ref 3.5–5.2)
Sodium: 140 mmol/L (ref 134–144)
Total Protein: 7.4 g/dL (ref 6.0–8.5)

## 2020-07-14 NOTE — Patient Instructions (Signed)
Medication Instructions:  No changes *If you need a refill on your cardiac medications before your next appointment, please call your pharmacy*   Lab Work: Your provider would like for you to have the following labs today: Lipid and CMET  If you have labs (blood work) drawn today and your tests are completely normal, you will receive your results only by: . MyChart Message (if you have MyChart) OR . A paper copy in the mail If you have any lab test that is abnormal or we need to change your treatment, we will call you to review the results.   Testing/Procedures: None ordered   Follow-Up: At [facility], you and your health needs are our priority.  As part of our continuing mission to provide you with exceptional heart care, we have created designated Provider Care Teams.  These Care Teams include your primary Cardiologist (physician) and Advanced Practice Providers (APPs -  Physician Assistants and Nurse Practitioners) who all work together to provide you with the care you need, when you need it.  We recommend signing up for the patient portal called "MyChart".  Sign up information is provided on this After Visit Summary.  MyChart is used to connect with patients for Virtual Visits (Telemedicine).  Patients are able to view lab/test results, encounter notes, upcoming appointments, etc.  Non-urgent messages can be sent to your provider as well.   To learn more about what you can do with MyChart, go to [email or web address].[email or web address].    Your next appointment:   12 month(s)  The format for your next appointment:   In Person  Provider:   You may see Mihai Croitoru, MD or one of the following Advanced Practice Providers on your designated Care Team:    Hao Meng, PA-C  Angela Duke, PA-C or   Krista Kroeger, PA-C   
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
## 2020-07-15 ENCOUNTER — Other Ambulatory Visit: Payer: Self-pay

## 2020-07-15 MED ORDER — HYDROCHLOROTHIAZIDE 12.5 MG PO TABS: 12.5000 mg | ORAL_TABLET | Freq: Every day | ORAL | 4 refills | Status: DC

## 2020-07-15 MED ORDER — METOPROLOL TARTRATE 50 MG PO TABS: 50.0000 mg | ORAL_TABLET | Freq: Two times a day (BID) | ORAL | 3 refills | Status: DC

## 2020-07-19 ENCOUNTER — Other Ambulatory Visit: Payer: Self-pay | Admitting: Family Medicine

## 2020-07-19 ENCOUNTER — Other Ambulatory Visit: Payer: Self-pay

## 2020-07-19 MED ORDER — LOSARTAN POTASSIUM 100 MG PO TABS
100.0000 mg | ORAL_TABLET | Freq: Every day | ORAL | 0 refills | Status: DC
Start: 2020-07-19 — End: 2022-03-03

## 2020-07-19 MED ORDER — ROSUVASTATIN CALCIUM 10 MG PO TABS: ORAL_TABLET | ORAL | 0 refills | Status: DC

## 2020-07-22 ENCOUNTER — Other Ambulatory Visit: Payer: Self-pay

## 2020-07-22 MED ORDER — ROSUVASTATIN CALCIUM 10 MG PO TABS: ORAL_TABLET | ORAL | 1 refills | Status: DC

## 2020-07-22 MED ORDER — AMLODIPINE BESYLATE 10 MG PO TABS: 10.0000 mg | ORAL_TABLET | Freq: Every day | ORAL | 1 refills | Status: DC

## 2020-07-22 MED ORDER — PANTOPRAZOLE SODIUM 40 MG PO TBEC
40.0000 mg | DELAYED_RELEASE_TABLET | Freq: Every day | ORAL | 1 refills | Status: DC
Start: 2020-07-22 — End: 2020-12-21

## 2020-08-25 ENCOUNTER — Other Ambulatory Visit: Payer: Medicare Other

## 2020-08-25 DIAGNOSIS — Z20822 Contact with and (suspected) exposure to covid-19: Secondary | ICD-10-CM | POA: Diagnosis not present

## 2020-08-26 LAB — NOVEL CORONAVIRUS, NAA: SARS-CoV-2, NAA: NOT DETECTED

## 2020-08-26 LAB — SARS-COV-2, NAA 2 DAY TAT

## 2020-09-12 ENCOUNTER — Encounter (HOSPITAL_COMMUNITY): Payer: Self-pay

## 2020-09-12 ENCOUNTER — Emergency Department (HOSPITAL_COMMUNITY): Payer: Medicare Other

## 2020-09-12 ENCOUNTER — Emergency Department (HOSPITAL_COMMUNITY)
Admission: EM | Admit: 2020-09-12 | Discharge: 2020-09-12 | Disposition: A | Discharge status: Home / Self Care | Payer: Medicare Other | Attending: Emergency Medicine | Admitting: Emergency Medicine

## 2020-09-12 ENCOUNTER — Other Ambulatory Visit: Payer: Self-pay

## 2020-09-12 DIAGNOSIS — Z7901 Long term (current) use of anticoagulants: Secondary | ICD-10-CM | POA: Insufficient documentation

## 2020-09-12 DIAGNOSIS — Z79899 Other long term (current) drug therapy: Secondary | ICD-10-CM | POA: Diagnosis not present

## 2020-09-12 DIAGNOSIS — I129 Hypertensive chronic kidney disease with stage 1 through stage 4 chronic kidney disease, or unspecified chronic kidney disease: Secondary | ICD-10-CM | POA: Diagnosis not present

## 2020-09-12 DIAGNOSIS — I6782 Cerebral ischemia: Secondary | ICD-10-CM | POA: Insufficient documentation

## 2020-09-12 DIAGNOSIS — S0001XA Abrasion of scalp, initial encounter: Secondary | ICD-10-CM | POA: Diagnosis not present

## 2020-09-12 DIAGNOSIS — W06XXXA Fall from bed, initial encounter: Secondary | ICD-10-CM | POA: Diagnosis not present

## 2020-09-12 DIAGNOSIS — Y92003 Bedroom of unspecified non-institutional (private) residence as the place of occurrence of the external cause: Secondary | ICD-10-CM | POA: Diagnosis not present

## 2020-09-12 DIAGNOSIS — M546 Pain in thoracic spine: Secondary | ICD-10-CM | POA: Insufficient documentation

## 2020-09-12 DIAGNOSIS — Y9389 Activity, other specified: Secondary | ICD-10-CM | POA: Diagnosis not present

## 2020-09-12 DIAGNOSIS — N183 Chronic kidney disease, stage 3 unspecified: Secondary | ICD-10-CM | POA: Diagnosis not present

## 2020-09-12 DIAGNOSIS — W19XXXA Unspecified fall, initial encounter: Secondary | ICD-10-CM | POA: Diagnosis not present

## 2020-09-12 DIAGNOSIS — S0990XA Unspecified injury of head, initial encounter: Secondary | ICD-10-CM | POA: Diagnosis not present

## 2020-09-12 DIAGNOSIS — Z043 Encounter for examination and observation following other accident: Secondary | ICD-10-CM | POA: Diagnosis not present

## 2020-09-12 DIAGNOSIS — G319 Degenerative disease of nervous system, unspecified: Secondary | ICD-10-CM | POA: Diagnosis not present

## 2020-09-12 DIAGNOSIS — I10 Essential (primary) hypertension: Secondary | ICD-10-CM | POA: Diagnosis not present

## 2020-09-12 DIAGNOSIS — M5030 Other cervical disc degeneration, unspecified cervical region: Secondary | ICD-10-CM | POA: Insufficient documentation

## 2020-09-12 DIAGNOSIS — M542 Cervicalgia: Secondary | ICD-10-CM | POA: Diagnosis not present

## 2020-09-12 NOTE — ED Notes (Signed)
Patient transported to CT 

## 2020-09-12 NOTE — Discharge Instructions (Addendum)
You were seen in the ED after a fall  CTs today did not reveal any internal bleeding or any bone injury  You may have some muscular soreness over the next few days.  Take 500 to 1000 mg of acetaminophen every 6-8 hours for pain.  Heat can help.  Gentle massage and stretches can also help.  Purchase over-the-counter lidocaine patch or Voltaren gel and massage on the areas where you are tender  Return for sudden severe headache, any stroke symptoms, worsening pain or any other new concerns  Follow-up with primary care doctor in 1 week for continued pain

## 2020-09-12 NOTE — ED Provider Notes (Signed)
[facility] EMERGENCY DEPARTMENT Provider Note   CSN: [id number] Arrival date & time: 09/12/20  1243     History Chief Complaint  Patient presents with  . Neck Pain    Christine Hogan is a 74 y.o. female with history of atrial fibrillation on Eliquis presents to the ED for evaluation of fall.  Patient states she was standing at the head of her bed trying to rearrange a window when she lost her balance and fell backwards.  She states she fell backwards and her legs flipped over her head sort of like doing a "somersault".  She ultimately fell down off the bed onto the ground landing on her back.  Reports sudden onset of neck pain and upper thoracic back pain along the middle.  This is constant, worse with movement and palpation.  Unsure if she hit her head but denies any loss of consciousness, headache, changes in her vision, nausea or vomiting.  Denies one-sided weakness or numbness.  Denies any other injuries specifically no chest pain, abdominal pain, back pain, hip pain or extremity pain.  Placed in a cervical collar by EMS.  HPI     Past Medical History:  Diagnosis Date  . Arthritis   . Atrial fibrillation (HCC)   . Chest pain    a. 2014: NST with no evidence of ischemia  . CKD (chronic kidney disease) stage 3, GFR 30-59 ml/min (HCC)   . Diastolic dysfunction    grade 2 with dyspnea on exertion  . H/O echocardiogram    a. 2014: echo showing EF of 65-70% with Grade 1 DD, moderate TR, and mild to moderate MR  . Hyperlipidemia   . Hypertension   . Near syncope   . Osteopenia     Patient Active Problem List   Diagnosis Date Noted  . CKD (chronic kidney disease) stage 3, GFR 30-59 ml/min (HCC)   . Abnormal finding on MRI of brain 09/15/2019  . Alteration of consciousness 08/26/2019  . Syncope 05/26/2019  . Nonrheumatic mitral (valve) insufficiency 10/08/2018  . Vasovagal syncope 10/08/2018  . Osteopenia   . Atrial fibrillation (HCC)   . Diastolic  dysfunction   . Paroxysmal atrial fibrillation (HCC) 02/09/2017  . Long term current use of anticoagulant 02/09/2017  . Chronic renal insufficiency 06/28/2013  . Chest pain 06/27/2013  . Essential hypertension   . Mixed hyperlipidemia     Past Surgical History:  Procedure Laterality Date  . ABDOMINAL HYSTERECTOMY     TAH/BSO (menorrhagia)  . bunion repair    . HERNIA REPAIR       OB History   No obstetric history on file.     Family History  Problem Relation Age of Onset  . Hypertension Father   . Heart disease Father        pacemaker- followedby Dr. Royann Shivers  . Diabetes Sister   . Hypertension Sister   . Diabetes Sister   . Hypertension Sister   . Hypertension Daughter   . Hypertension Daughter   . Kidney failure Mother     Social History   Tobacco Use  . Smoking status: Never Smoker  . Smokeless tobacco: Never Used  Vaping Use  . Vaping Use: Never used  Substance Use Topics  . Alcohol use: Yes  . Drug use: No    Home Medications Prior to Admission medications   Medication Sig Start Date End Date Taking? Authorizing Provider  amLODipine (NORVASC) 10 MG tablet Take 1 tablet (10 mg total)  by mouth daily. 07/22/20  Yes Donita Brooks, MD  apixaban (ELIQUIS) 5 MG TABS tablet Take 1 tablet (5 mg total) by mouth 2 (two) times daily. 05/27/19  Yes Croitoru, Mihai, MD  hydrochlorothiazide (HYDRODIURIL) 12.5 MG tablet Take 1 tablet (12.5 mg total) by mouth daily. 07/15/20  Yes Croitoru, Mihai, MD  LORazepam (ATIVAN) 1 MG tablet Take 1 tablet (1 mg total) by mouth 3 (three) times daily as needed for anxiety. 01/06/20  Yes Vanetta Mulders, MD  losartan (COZAAR) 100 MG tablet Take 1 tablet (100 mg total) by mouth daily. 07/19/20  Yes Donita Brooks, MD  metoprolol tartrate (LOPRESSOR) 50 MG tablet Take 1 tablet (50 mg total) by mouth 2 (two) times daily. 07/15/20  Yes Croitoru, Mihai, MD  Multiple Vitamins-Minerals (CENTRUM SILVER 50+WOMEN) TABS Take 1 tablet by mouth  daily with breakfast.   Yes [provider]  nitroGLYCERIN (NITROSTAT) 0.4 MG SL tablet Place 1 tablet (0.4 mg total) under the tongue every 5 (five) minutes as needed for chest pain. 12/11/13  Yes Roy, Velna Hatchet, MD  pantoprazole (PROTONIX) 40 MG tablet Take 1 tablet (40 mg total) by mouth daily. Patient taking differently: Take 40 mg by mouth daily before breakfast.  07/22/20  Yes Pickard, Priscille Heidelberg, MD  rosuvastatin (CRESTOR) 10 MG tablet TAKE 1 TABLET BY MOUTH ONCE DAILY Patient taking differently: Take 10 mg by mouth daily.  07/22/20  Yes Donita Brooks, MD  ondansetron (ZOFRAN-ODT) 4 MG disintegrating tablet Take 1 tablet (4 mg total) by mouth every 8 (eight) hours as needed for nausea or vomiting. Patient not taking: Reported on 09/12/2020 08/21/19   Mardella Layman, MD    Allergies    Codeine  Review of Systems   Review of Systems  Musculoskeletal: Positive for back pain and neck pain.  Hematological: Bruises/bleeds easily.  All other systems reviewed and are negative.   Physical Exam Updated Vital Signs BP (!) 169/86   Pulse 75   Temp (!) 97.5 F (36.4 C) (Oral)   Resp 19   SpO2 95%   Physical Exam Constitutional:      Appearance: She is well-developed.  HENT:     Head: Normocephalic.     Comments: Small abrasion of the occipital scalp, nontender.  No other facial or scalp bone tenderness, crepitus or signs of injury.    Nose: Nose normal.     Mouth/Throat:     Comments: Dentition stable.  No intraoral or tongue injury. Eyes:     General: Lids are normal.  Neck:     Comments: In cervical collar.  Mid/lower midline tenderness.  Bilateral muscular paraspinal tenderness right greater than left.  Trachea midline.  Range of motion deferred. Cardiovascular:     Rate and Rhythm: Normal rate and regular rhythm.  Pulmonary:     Effort: Pulmonary effort is normal. No respiratory distress.     Breath sounds: Normal breath sounds.  Abdominal:     Palpations:  Abdomen is soft.     Tenderness: There is no abdominal tenderness.  Musculoskeletal:        General: Tenderness present. Normal range of motion.     Cervical back: Normal range of motion. Tenderness present.     Comments: T-spine: Upper midline thoracic tenderness, no obvious step-offs.  No paraspinal thoracic muscular tenderness.  L-spine: No midline or paraspinal muscle tenderness.  Full passive range of motion of upper and lower extremities including hips without any pain or signs of trauma.  Neurological:  Mental Status: She is alert.     Comments:  Mental Status: Patient is awake, alert, oriented to person, place, year, and situation. Patient is able to give a clear and coherent history.  Speech is fluent and clear without dysarthria or aphasia.  No signs of neglect.  Cranial Nerves: I not tested II temporal visual fields full bilaterally intact. PERRL.   III, IV, VI EOMs intact without ptosis or diplopia  V sensation to light touch intact in all 3 divisions of trigeminal nerve bilaterally  VII facial movements symmetric bilaterally VIII hearing intact to voice/conversation  IX, X no uvula deviation, symmetric rise of soft palate/uvula XI 5/5 SCM and trapezius strength bilaterally  XII tongue protrusion midline, symmetric L/R movements  Motor: Strength 5/5 in upper/lower extremities.  Sensation to light touch intact in face, upper/lower extremities. No pronator drift. No leg drop.  Cerebellar: No ataxia with finger to nose.   Psychiatric:        Behavior: Behavior normal.     ED Results / Procedures / Treatments   Labs (all labs ordered are listed, but only abnormal results are displayed) Labs Reviewed - No data to display  EKG None  Radiology CT Head Wo Contrast  Result Date: 09/12/2020 CLINICAL DATA:  Was standing on bed adjusting window and fell backwards, posterior cervical and thoracic pain, on Eliquis, neck pain EXAM: CT HEAD WITHOUT CONTRAST CT CERVICAL  SPINE WITHOUT CONTRAST TECHNIQUE: Multidetector CT imaging of the head and cervical spine was performed following the standard protocol without intravenous contrast. Multiplanar CT image reconstructions of the cervical spine were also generated. COMPARISON:  CT head 05/13/2020 FINDINGS: CT HEAD FINDINGS Brain: Generalized atrophy. Normal ventricular morphology. No midline shift or mass effect. Minimal small vessel chronic ischemic changes of deep cerebral white matter. No definite intracranial hemorrhage, mass lesion or evidence of acute infarction. No extra-axial fluid collections. Vascular: No hyperdense vessels. Minimal atherosclerotic calcification of internal carotid arteries at skull base Skull: Intact Sinuses/Orbits: Clear Other: N/A CT CERVICAL SPINE FINDINGS Alignment: Normal Skull base and vertebrae: Osseous mineralization normal. Visualized skull base intact. Vertebral body heights maintained. Scattered disc space narrowing and endplate spur formation C3-C4 through C6-C7. Facet alignments normal. No fracture, subluxation, or bone destruction. Soft tissues and spinal canal: Prevertebral soft tissues normal thickness. Disc levels:  Unremarkable Upper chest: Lung apices clear Other: N/A IMPRESSION: Atrophy with minimal small vessel chronic ischemic changes of deep cerebral white matter. No acute intracranial abnormalities. Degenerative disc disease changes of the cervical spine. No acute cervical spine abnormalities. Electronically Signed   By: Ulyses SouthwardMark  Boles M.D.   On: 09/12/2020 14:49   CT Cervical Spine Wo Contrast  Result Date: 09/12/2020 CLINICAL DATA:  Was standing on bed adjusting window and fell backwards, posterior cervical and thoracic pain, on Eliquis, neck pain EXAM: CT HEAD WITHOUT CONTRAST CT CERVICAL SPINE WITHOUT CONTRAST TECHNIQUE: Multidetector CT imaging of the head and cervical spine was performed following the standard protocol without intravenous contrast. Multiplanar CT image  reconstructions of the cervical spine were also generated. COMPARISON:  CT head 05/13/2020 FINDINGS: CT HEAD FINDINGS Brain: Generalized atrophy. Normal ventricular morphology. No midline shift or mass effect. Minimal small vessel chronic ischemic changes of deep cerebral white matter. No definite intracranial hemorrhage, mass lesion or evidence of acute infarction. No extra-axial fluid collections. Vascular: No hyperdense vessels. Minimal atherosclerotic calcification of internal carotid arteries at skull base Skull: Intact Sinuses/Orbits: Clear Other: N/A CT CERVICAL SPINE FINDINGS Alignment: Normal Skull base and vertebrae:  Osseous mineralization normal. Visualized skull base intact. Vertebral body heights maintained. Scattered disc space narrowing and endplate spur formation C3-C4 through C6-C7. Facet alignments normal. No fracture, subluxation, or bone destruction. Soft tissues and spinal canal: Prevertebral soft tissues normal thickness. Disc levels:  Unremarkable Upper chest: Lung apices clear Other: N/A IMPRESSION: Atrophy with minimal small vessel chronic ischemic changes of deep cerebral white matter. No acute intracranial abnormalities. Degenerative disc disease changes of the cervical spine. No acute cervical spine abnormalities. Electronically Signed   By: Ulyses Southward M.D.   On: 09/12/2020 14:49   CT Thoracic Spine Wo Contrast  Result Date: 09/12/2020 CLINICAL DATA:  Larey Seat from bed.  On Eliquis. EXAM: CT THORACIC SPINE WITHOUT CONTRAST TECHNIQUE: Multidetector CT images of the thoracic were obtained using the standard protocol without intravenous contrast. COMPARISON:  None. FINDINGS: Alignment: Normal alignment with mild kyphosis. Vertebrae: Negative for fracture or mass. Paraspinal and other soft tissues: Negative for soft tissue mass or hematoma. Mild atherosclerotic calcification in the thoracic aorta. Visualized lungs clear. Cardiac enlargement. Disc levels: Minimal degenerative changes in the  thoracic spine with mild anterior and right-sided spurring most prominent at T7-8. No significant spinal stenosis or disc protrusion. IMPRESSION: No acute injury.  Negative for spinal stenosis. Electronically Signed   By: Marlan Palau M.D.   On: 09/12/2020 14:10    Procedures Procedures (including critical care time)  Medications Ordered in ED Medications - No data to display  ED Course  I have reviewed the triage vital signs and the nursing notes.  Pertinent labs & imaging results that were available during my care of the patient were reviewed by me and considered in my medical decision making (see chart for details).  Clinical Course as of Sep 13 1507  Wynelle Link Sep 12, 2020  1452 IMPRESSION: Atrophy with minimal small vessel chronic ischemic changes of deep cerebral white matter.  No acute intracranial abnormalities.  Degenerative disc disease changes of the cervical spine.  No acute cervical spine abnormalities.  CT Head Wo Contrast [CG]  1452 IMPRESSION: No acute injury. Negative for spinal stenosis.    CT Thoracic Spine Wo Contrast [CG]    Clinical Course User Index [CG] Liberty Handy, PA-C   MDM Rules/Calculators/A&P                          74 year old female presents after mechanical fall off of her bed.  On Eliquis for atrial fibrillation.  Reports low midline cervical and upper thoracic pain but no other physical injuries.  No headache.  No neuro deficits.  On exam she has mild midline cervical thoracic midline tenderness but no neuro deficits distally.  Imaging ordered: CT head, C-spine and T-spine.  Imaging personally visualized and interpreted and no acute findings.  Patient reevaluated and no clinical decline.  We will plan to ambulate here.  Anticipate discharge with tylenol, rest.  Shared with EDP. Final Clinical Impression(s) / ED Diagnoses Final diagnoses:  Neck pain  Fall, initial encounter    Rx / DC Orders ED Discharge Orders    None        Jerrell Mylar 09/12/20 1508    Melene Plan, DO 09/12/20 1510

## 2020-09-12 NOTE — ED Triage Notes (Signed)
To room via EMS from home. Pt was standing on bed adjusting window, lost balance and fell backwards doing a somersault and landing on back on the bed.  C collar on from EMS.  C/o posterior cervical and thoracic pain.   Denies numbness/tingling, pain, or weakness to arms.   Hand grips equal and strong.  EMS BP 176/80 HR 87 SpO2 95%

## 2020-09-16 ENCOUNTER — Telehealth: Payer: Self-pay

## 2020-09-17 ENCOUNTER — Telehealth: Payer: Self-pay

## 2020-09-17 NOTE — Telephone Encounter (Signed)
Taking Advil every 6 hours, and soaking her body. Is it anything else she should possibly be doing? Her pain level is a 6. Pt has appt 09-21-20 with provider

## 2020-09-20 NOTE — Telephone Encounter (Signed)
Pt has appt for 09-21-20

## 2020-09-21 ENCOUNTER — Other Ambulatory Visit: Payer: Self-pay

## 2020-09-21 ENCOUNTER — Ambulatory Visit (INDEPENDENT_AMBULATORY_CARE_PROVIDER_SITE_OTHER): Payer: Medicare Other | Admitting: Family Medicine

## 2020-09-21 VITALS — BP 150/80 | HR 93 | Temp 97.9°F | Ht 65.0 in | Wt 161.0 lb

## 2020-09-21 DIAGNOSIS — I10 Essential (primary) hypertension: Secondary | ICD-10-CM | POA: Diagnosis not present

## 2020-09-21 DIAGNOSIS — N183 Chronic kidney disease, stage 3 unspecified: Secondary | ICD-10-CM

## 2020-09-21 DIAGNOSIS — M542 Cervicalgia: Secondary | ICD-10-CM | POA: Diagnosis not present

## 2020-09-21 DIAGNOSIS — I480 Paroxysmal atrial fibrillation: Secondary | ICD-10-CM

## 2020-09-21 MED ORDER — METHOCARBAMOL 500 MG PO TABS: 500.0000 mg | ORAL_TABLET | Freq: Three times a day (TID) | ORAL | 0 refills | Status: DC

## 2020-09-21 NOTE — Addendum Note (Signed)
Addended by: Roxy Cedar on: 09/21/2020 12:42 PM   Modules accepted: Orders

## 2020-09-21 NOTE — Progress Notes (Signed)
Subjective:    Patient ID: Christine Hogan, female    DOB: Apr 05, 1946, 74 y.o.   MRN: [id number]  HPI 03/2020 Patient is a very sweet 74 year old African-American female who presents today with several weeks of abdominal pain.  It does not seem to localize into one position.  She states that it is near her umbilicus.  However on her exam today it seems to be more in the epigastric area.  She states that it comes and goes.  She denies any constipation.  Defecation does not help the pain.  She denies any melena or hematochezia.  She denies any diarrhea.  Alcohol does irritate the pain and make it worse.  Sometimes food seems to make it worse.  Today she is tender to palpation of the right upper quadrant and also over the pancreas.  She denies any chest pain.  She denies any shortness of breath.  She denies any nausea or vomiting.  She denies any hematemesis.  She is on Eliquis for atrial fibrillation.  Today on exam she is in normal sinus rhythm however.  She denies any dysuria, urgency, or frequency.  She has a history of a hysterectomy.  It has been several years since her last colonoscopy.  A CT scan of her abdomen in 2016 did reveal numerous diverticula.  At that time, my plan was: Differential diagnosis includes gastritis, peptic ulcer disease, biliary colic, pancreatitis.  Less likely would be IBS or diverticulitis.  I favor either gastritis or pancreatitis given her past medical history.  I will start the patient on Protonix 40 mg a day.  I recommended complete abstinence from alcohol.  I will check a CBC, CMP, and lipase.  If liver tests or lipase are elevated, I would recommend imaging of the abdomen and pelvis to evaluate further.  If lab work is completely normal, we may allow Protonix a therapeutic trial of 1 week to see if it is beneficial.  Continue to encourage abstinence from alcohol.  09/21/20 Patient states that the abdominal pain has gone away.  She still occasionally drinks alcohol and has  no desire to quit at the present time.  I have encouraged abstinence from alcohol but at the present time she is not interested in any kind of counseling.  Recently, the patient was hanging a curtain above her bed.  She lost her balance and stepped backwards off of her bed falling and striking a dresser and a fan.  She struck the posterior aspect of her neck roughly around the level of C7.  She believes she hit a fan when she fell in that area.  She continues to have some pain and discomfort in that area although the pain is much better than it was last week when the injury occurred.  She actually went to the emergency room after the fall due to the severity of the pain.  At that time they did a CT scan of the head, neck, and thoracic spine.  No fractures were identified thankfully.  The patient also reports some mild hearing loss in her right ear and has a cerumen impaction. Past Medical History:  Diagnosis Date  . Arthritis   . Atrial fibrillation (HCC)   . Chest pain    a. 2014: NST with no evidence of ischemia  . CKD (chronic kidney disease) stage 3, GFR 30-59 ml/min (HCC)   . Diastolic dysfunction    grade 2 with dyspnea on exertion  . H/O echocardiogram    a. 2014:  echo showing EF of 65-70% with Grade 1 DD, moderate TR, and mild to moderate MR  . Hyperlipidemia   . Hypertension   . Near syncope   . Osteopenia    Past Surgical History:  Procedure Laterality Date  . ABDOMINAL HYSTERECTOMY     TAH/BSO (menorrhagia)  . bunion repair    . HERNIA REPAIR     Current Outpatient Medications on File Prior to Visit  Medication Sig Dispense Refill  . amLODipine (NORVASC) 10 MG tablet Take 1 tablet (10 mg total) by mouth daily. 90 tablet 1  . apixaban (ELIQUIS) 5 MG TABS tablet Take 1 tablet (5 mg total) by mouth 2 (two) times daily. 180 tablet 3  . hydrochlorothiazide (HYDRODIURIL) 12.5 MG tablet Take 1 tablet (12.5 mg total) by mouth daily. 90 tablet 4  . LORazepam (ATIVAN) 1 MG tablet Take 1  tablet (1 mg total) by mouth 3 (three) times daily as needed for anxiety. 15 tablet 0  . losartan (COZAAR) 100 MG tablet Take 1 tablet (100 mg total) by mouth daily. 90 tablet 0  . metoprolol tartrate (LOPRESSOR) 50 MG tablet Take 1 tablet (50 mg total) by mouth 2 (two) times daily. 180 tablet 3  . Multiple Vitamins-Minerals (CENTRUM SILVER 50+WOMEN) TABS Take 1 tablet by mouth daily with breakfast.    . nitroGLYCERIN (NITROSTAT) 0.4 MG SL tablet Place 1 tablet (0.4 mg total) under the tongue every 5 (five) minutes as needed for chest pain. 20 tablet 0  . ondansetron (ZOFRAN-ODT) 4 MG disintegrating tablet Take 1 tablet (4 mg total) by mouth every 8 (eight) hours as needed for nausea or vomiting. (Patient not taking: Reported on 09/12/2020) 15 tablet 0  . pantoprazole (PROTONIX) 40 MG tablet Take 1 tablet (40 mg total) by mouth daily. (Patient taking differently: Take 40 mg by mouth daily before breakfast. ) 90 tablet 1  . rosuvastatin (CRESTOR) 10 MG tablet TAKE 1 TABLET BY MOUTH ONCE DAILY (Patient taking differently: Take 10 mg by mouth daily. ) 90 tablet 1   No current facility-administered medications on file prior to visit.   Allergies  Allergen Reactions  . Codeine Nausea And Vomiting and Other (See Comments)    Dizziness, also   Social History   Socioeconomic History  . Marital status: Divorced    Spouse name: Not on file  . Number of children: 5  . Years of education: 12th grade  . Highest education level: Not on file  Occupational History  . Occupation: Retired  Tobacco Use  . Smoking status: Never Smoker  . Smokeless tobacco: Never Used  Vaping Use  . Vaping Use: Never used  Substance and Sexual Activity  . Alcohol use: Yes  . Drug use: No  . Sexual activity: Yes  Other Topics Concern  . Not on file  Social History Narrative   Lives with her son.   Right-handed.   Four living children.   Occasional caffeine use.   Social Determinants of Health   Financial  Resource Strain:   . Difficulty of Paying Living Expenses: Not on file  Food Insecurity:   . Worried About Programme researcher, broadcasting/film/video in the Last Year: Not on file  . Ran Out of Food in the Last Year: Not on file  Transportation Needs:   . Lack of Transportation (Medical): Not on file  . Lack of Transportation (Non-Medical): Not on file  Physical Activity:   . Days of Exercise per Week: Not on file  . Minutes  of Exercise per Session: Not on file  Stress:   . Feeling of Stress : Not on file  Social Connections:   . Frequency of Communication with Friends and Family: Not on file  . Frequency of Social Gatherings with Friends and Family: Not on file  . Attends Religious Services: Not on file  . Active Member of Clubs or Organizations: Not on file  . Attends Banker Meetings: Not on file  . Marital Status: Not on file  Intimate Partner Violence:   . Fear of Current or Ex-Partner: Not on file  . Emotionally Abused: Not on file  . Physically Abused: Not on file  . Sexually Abused: Not on file     Review of Systems  All other systems reviewed and are negative.      Objective:   Physical Exam Vitals reviewed.  Constitutional:      General: She is not in acute distress.    Appearance: Normal appearance. She is normal weight. She is not ill-appearing, toxic-appearing or diaphoretic.  Eyes:     Extraocular Movements: Extraocular movements intact.     Pupils: Pupils are equal, round, and reactive to light.  Neck:   Cardiovascular:     Rate and Rhythm: Normal rate and regular rhythm.     Pulses: Normal pulses.     Heart sounds: Normal heart sounds. No murmur heard.  No friction rub. No gallop.   Pulmonary:     Effort: Pulmonary effort is normal. No respiratory distress.     Breath sounds: Normal breath sounds. No stridor. No wheezing, rhonchi or rales.  Abdominal:     General: Abdomen is flat. Bowel sounds are normal.     Palpations: Abdomen is soft.  Musculoskeletal:      Cervical back: No erythema. Muscular tenderness present. No pain with movement or spinous process tenderness. Normal range of motion.  Neurological:     General: No focal deficit present.     Mental Status: She is alert and oriented to person, place, and time. Mental status is at baseline.     Cranial Nerves: No cranial nerve deficit.     Sensory: No sensory deficit.     Motor: No weakness.     Coordination: Coordination normal.     Gait: Gait normal.     Deep Tendon Reflexes: Reflexes normal.  Psychiatric:        Mood and Affect: Mood normal.        Behavior: Behavior normal.        Thought Content: Thought content normal.        Judgment: Judgment normal.           Assessment & Plan:  Chronic renal impairment, stage 3 (moderate), unspecified whether stage 3a or 3b CKD (HCC)  Paroxysmal atrial fibrillation (HCC)  Benign essential HTN  Neck pain  I believe the neck pain is likely due to a muscle contusion after the fall.  There is no evidence of any fracture.  The pain is getting better.  She was taking ibuprofen but given her chronic kidney disease and taking Eliquis for atrial fibrillation I have recommended that she stop that.  I explained that she can only take Tylenol.  If she has severe breakthrough pain she could also use Robaxin 500 mg every 8 hours as needed but I cautioned her to be careful as this medication can make her sleepy.  Her blood pressure slightly elevated today.  I have counseled her to avoid alcohol.  I would like to check a CBC, CMP to monitor her renal function.

## 2020-09-22 LAB — COMPLETE METABOLIC PANEL WITH GFR
AG Ratio: 1.6 (calc) (ref 1.0–2.5)
ALT: 16 U/L (ref 6–29)
AST: 17 U/L (ref 10–35)
Albumin: 4.3 g/dL (ref 3.6–5.1)
Alkaline phosphatase (APISO): 63 U/L (ref 37–153)
BUN/Creatinine Ratio: 17 (calc) (ref 6–22)
BUN: 28 mg/dL — ABNORMAL HIGH (ref 7–25)
CO2: 26 mmol/L (ref 20–32)
Calcium: 10.1 mg/dL (ref 8.6–10.4)
Chloride: 109 mmol/L (ref 98–110)
Creat: 1.68 mg/dL — ABNORMAL HIGH (ref 0.60–0.93)
GFR, Est African American: 35 mL/min/{1.73_m2} — ABNORMAL LOW (ref >=60)
GFR, Est Non African American: 30 mL/min/{1.73_m2} — ABNORMAL LOW (ref >=60)
Globulin: 2.7 g/dL (calc) (ref 1.9–3.7)
Glucose, Bld: 88 mg/dL (ref 65–99)
Potassium: 4.1 mmol/L (ref 3.5–5.3)
Sodium: 145 mmol/L (ref 135–146)
Total Bilirubin: 0.6 mg/dL (ref 0.2–1.2)
Total Protein: 7 g/dL (ref 6.1–8.1)

## 2020-09-22 LAB — CBC WITH DIFFERENTIAL/PLATELET
Absolute Monocytes: 504 cells/uL (ref 200–950)
Basophils Absolute: 21 cells/uL (ref 0–200)
Basophils Relative: 0.6 %
Eosinophils Absolute: 81 cells/uL (ref 15–500)
Eosinophils Relative: 2.3 %
HCT: 31.8 % — ABNORMAL LOW (ref 35.0–45.0)
Hemoglobin: 10.3 g/dL — ABNORMAL LOW (ref 11.7–15.5)
Lymphs Abs: 1467 cells/uL (ref 850–3900)
MCH: 28.1 pg (ref 27.0–33.0)
MCHC: 32.4 g/dL (ref 32.0–36.0)
MCV: 86.6 fL (ref 80.0–100.0)
MPV: 10.3 fL (ref 7.5–12.5)
Monocytes Relative: 14.4 %
Neutro Abs: 1428 cells/uL — ABNORMAL LOW (ref 1500–7800)
Neutrophils Relative %: 40.8 %
Platelets: 198 10*3/uL (ref 140–400)
RBC: 3.67 10*6/uL — ABNORMAL LOW (ref 3.80–5.10)
RDW: 13.9 % (ref 11.0–15.0)
Total Lymphocyte: 41.9 %
WBC: 3.5 10*3/uL — ABNORMAL LOW (ref 3.8–10.8)

## 2020-09-23 ENCOUNTER — Ambulatory Visit: Payer: Medicare Other | Admitting: Family Medicine

## 2020-09-23 ENCOUNTER — Other Ambulatory Visit: Payer: Self-pay

## 2020-09-23 DIAGNOSIS — D649 Anemia, unspecified: Secondary | ICD-10-CM

## 2020-09-23 NOTE — Progress Notes (Signed)
iron

## 2020-09-24 ENCOUNTER — Other Ambulatory Visit: Payer: Self-pay

## 2020-09-24 ENCOUNTER — Other Ambulatory Visit: Payer: Medicare Other

## 2020-09-24 DIAGNOSIS — R6889 Other general symptoms and signs: Secondary | ICD-10-CM | POA: Diagnosis not present

## 2020-09-24 DIAGNOSIS — D539 Nutritional anemia, unspecified: Secondary | ICD-10-CM | POA: Diagnosis not present

## 2020-09-24 DIAGNOSIS — D649 Anemia, unspecified: Secondary | ICD-10-CM | POA: Diagnosis not present

## 2020-09-25 LAB — IRON: Iron: 102 ug/dL (ref 45–160)

## 2020-09-25 LAB — VITAMIN B12: Vitamin B-12: 444 pg/mL (ref 200–1100)

## 2020-09-25 LAB — FERRITIN: Ferritin: 287 ng/mL (ref 16–288)

## 2020-10-08 ENCOUNTER — Encounter: Payer: Self-pay | Admitting: Family Medicine

## 2020-10-08 ENCOUNTER — Other Ambulatory Visit: Payer: Self-pay

## 2020-10-08 ENCOUNTER — Ambulatory Visit (INDEPENDENT_AMBULATORY_CARE_PROVIDER_SITE_OTHER): Payer: Medicare Other | Admitting: Family Medicine

## 2020-10-08 ENCOUNTER — Ambulatory Visit
Admission: RE | Admit: 2020-10-08 | Discharge: 2020-10-08 | Disposition: A | Discharge status: Home / Self Care | Payer: Medicare Other | Source: Ambulatory Visit | Attending: Family Medicine | Admitting: Family Medicine

## 2020-10-08 ENCOUNTER — Other Ambulatory Visit: Payer: Medicare Other

## 2020-10-08 VITALS — BP 120/68 | HR 74 | Temp 98.7°F | Resp 14 | Ht 65.0 in | Wt 157.0 lb

## 2020-10-08 DIAGNOSIS — M25511 Pain in right shoulder: Secondary | ICD-10-CM | POA: Diagnosis not present

## 2020-10-08 DIAGNOSIS — M79621 Pain in right upper arm: Secondary | ICD-10-CM | POA: Diagnosis not present

## 2020-10-08 DIAGNOSIS — D649 Anemia, unspecified: Secondary | ICD-10-CM | POA: Diagnosis not present

## 2020-10-08 DIAGNOSIS — M75101 Unspecified rotator cuff tear or rupture of right shoulder, not specified as traumatic: Secondary | ICD-10-CM

## 2020-10-08 DIAGNOSIS — W19XXXD Unspecified fall, subsequent encounter: Secondary | ICD-10-CM

## 2020-10-08 DIAGNOSIS — M25521 Pain in right elbow: Secondary | ICD-10-CM | POA: Diagnosis not present

## 2020-10-08 DIAGNOSIS — G542 Cervical root disorders, not elsewhere classified: Secondary | ICD-10-CM

## 2020-10-08 MED ORDER — METHYLPREDNISOLONE 4 MG PO TBPK: ORAL_TABLET | ORAL | 0 refills | Status: DC

## 2020-10-08 NOTE — Progress Notes (Signed)
   Subjective:    Patient ID: Christine Hogan, female    DOB: [birth year], 74 y.o.   MRN: [id number]  Patient presents for Arm Pain (x1 week- B arm pain and L hand tingling)  Right arm pain for the past week. 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   She was seen by PCP on 09/21/20, given robain for neck pain, he has not taken it yet    Review Of Systems:  GEN- denies fatigue, fever, weight loss,weakness, recent illness HEENT- denies eye drainage, change in vision, nasal discharge, CVS- denies chest pain, palpitations RESP- denies SOB, cough, wheeze ABD- denies N/V, change in stools, abd pain GU- denies dysuria, hematuria, dribbling, incontinence MSK- + joint pain, muscle aches, injury Neuro- denies headache, dizziness, syncope, seizure activity       Objective:    BP 120/68   Pulse 74   Temp 98.7 F (37.1 C) (Temporal)   Resp 14   Ht 5\' 5"  (1.651 m)   Wt 157 lb (71.2 kg)   SpO2 96%   BMI 26.13 kg/m  GEN- NAD, alert and oriented x3 HEENT- PERRL, EOMI, non injected sclera, pink conjunctiva, MMM, oropharynx clear Neck- Supple, fair ROM, neg spurlings  CVS- RRR, no murmur RESP-CTAB MSK -FAIR rom UPPER Ext, TTP top of right shoulder, lower biceps and medial olecranon,  Mild swelling of right medial olecranon NEURO- normal tone UE, sensation in tact, normal moniflament hands  EXT- No edema Pulses- Radial, DP- 2+        Assessment & Plan:      Problem List Items Addressed This Visit    None    Visit Diagnoses    Pain in right upper arm    -  Primary   Relevant Orders   DG Shoulder Right (Completed)   DG Humerus Right (Completed)   Anemia, unspecified type       Right elbow pain       s/p fall, obtain xray shoulder arm, can ice, continue topical rubs,  medrol dosepak given pending xray ortho referral   Relevant Orders   DG Elbow Complete Right (Completed)   Rotator cuff syndrome of right shoulder       Relevant Orders   DG Shoulder Right (Completed)   DG Humerus Right (Completed)   Accidental fall, subsequent encounter       Relevant Orders   DG Shoulder Right (Completed)   DG Humerus Right (Completed)   DG Elbow Complete Right (Completed)   Cervical nerve root impingement       Medrol dose pak given for nerve impingment, could be coming from neck or shoulder      Note: This dictation was prepared with Dragon dictation along with smaller phrase technology. Any transcriptional errors that result from this process are unintentional.

## 2020-10-08 NOTE — Patient Instructions (Signed)
Get xrays at [address] [place]. [address] - [facility] Take steroids for nerve pain  Take tylenol  Ice elbow  I will call with results

## 2020-10-09 LAB — FECAL GLOBIN BY IMMUNOCHEMISTRY
FECAL GLOBIN RESULT:: NOT DETECTED
MICRO NUMBER:: 11274439
SPECIMEN QUALITY:: ADEQUATE

## 2020-10-10 ENCOUNTER — Encounter: Payer: Self-pay | Admitting: Family Medicine

## 2020-10-22 ENCOUNTER — Telehealth: Payer: Self-pay | Admitting: Cardiovascular Disease

## 2020-10-22 NOTE — Telephone Encounter (Signed)
Patient calling the office for samples of medication:   1.  What medication and dosage are you requesting samples for? apixaban (ELIQUIS) 5 MG TABS tablet  2.  Are you currently out of this medication? Yes    

## 2020-10-22 NOTE — Telephone Encounter (Signed)
We have plenty

## 2020-10-22 NOTE — Telephone Encounter (Signed)
Samples placed up front along with patient assistance for pt to pick up. Pt made aware.  Eliquis 5 mg Qty: 2 boxes Lot# [id number] Exp: 8/23

## 2020-11-02 ENCOUNTER — Ambulatory Visit (INDEPENDENT_AMBULATORY_CARE_PROVIDER_SITE_OTHER): Payer: Medicare Other | Admitting: Family Medicine

## 2020-11-02 ENCOUNTER — Ambulatory Visit
Admission: RE | Admit: 2020-11-02 | Discharge: 2020-11-02 | Disposition: A | Discharge status: Home / Self Care | Payer: Medicare Other | Source: Ambulatory Visit | Attending: Family Medicine | Admitting: Family Medicine

## 2020-11-02 ENCOUNTER — Other Ambulatory Visit: Payer: Self-pay

## 2020-11-02 VITALS — BP 140/90 | HR 70 | Temp 97.6°F | Ht 65.0 in | Wt 160.0 lb

## 2020-11-02 DIAGNOSIS — M545 Low back pain, unspecified: Secondary | ICD-10-CM

## 2020-11-02 MED ORDER — TIZANIDINE HCL 4 MG PO TABS: 4.0000 mg | ORAL_TABLET | Freq: Four times a day (QID) | ORAL | 0 refills | Status: DC | PRN

## 2020-11-02 NOTE — Progress Notes (Signed)
Subjective:    Patient ID: Christine Hogan, female    DOB: February 12, 1946, 74 y.o.   MRN: [id number]  HPI Patient injured her back Christmas Eve.  She was getting out of a car and she felt a pain in the middle of her back around the level of L4.  The pain is a bandlike sensation of tightness in her lower back.  There is no radiation down her legs.  She denies any numbness or tingling in her legs.  She denies any weakness in her legs.  She denies any saddle anesthesia.  She denies any dysuria or hematuria. Past Medical History:  Diagnosis Date  . Arthritis   . Atrial fibrillation (HCC)   . Chest pain    a. 2014: NST with no evidence of ischemia  . CKD (chronic kidney disease) stage 3, GFR 30-59 ml/min (HCC)   . Diastolic dysfunction    grade 2 with dyspnea on exertion  . H/O echocardiogram    a. 2014: echo showing EF of 65-70% with Grade 1 DD, moderate TR, and mild to moderate MR  . Hyperlipidemia   . Hypertension   . Near syncope   . Osteopenia    Past Surgical History:  Procedure Laterality Date  . ABDOMINAL HYSTERECTOMY     TAH/BSO (menorrhagia)  . bunion repair    . HERNIA REPAIR     Current Outpatient Medications on File Prior to Visit  Medication Sig Dispense Refill  . amLODipine (NORVASC) 10 MG tablet Take 1 tablet (10 mg total) by mouth daily. 90 tablet 1  . apixaban (ELIQUIS) 5 MG TABS tablet Take 1 tablet (5 mg total) by mouth 2 (two) times daily. 180 tablet 3  . hydrochlorothiazide (HYDRODIURIL) 12.5 MG tablet Take 1 tablet (12.5 mg total) by mouth daily. 90 tablet 4  . LORazepam (ATIVAN) 1 MG tablet Take 1 tablet (1 mg total) by mouth 3 (three) times daily as needed for anxiety. 15 tablet 0  . losartan (COZAAR) 100 MG tablet Take 1 tablet (100 mg total) by mouth daily. 90 tablet 0  . methocarbamol (ROBAXIN) 500 MG tablet Take 1 tablet (500 mg total) by mouth 3 (three) times daily. 10 tablet 0  . methylPREDNISolone (MEDROL DOSEPAK) 4 MG TBPK tablet Take as directed on  package 21 tablet 0  . metoprolol tartrate (LOPRESSOR) 50 MG tablet Take 1 tablet (50 mg total) by mouth 2 (two) times daily. 180 tablet 3  . Multiple Vitamins-Minerals (CENTRUM SILVER 50+WOMEN) TABS Take 1 tablet by mouth daily with breakfast.    . nitroGLYCERIN (NITROSTAT) 0.4 MG SL tablet Place 1 tablet (0.4 mg total) under the tongue every 5 (five) minutes as needed for chest pain. 20 tablet 0  . ondansetron (ZOFRAN-ODT) 4 MG disintegrating tablet Take 1 tablet (4 mg total) by mouth every 8 (eight) hours as needed for nausea or vomiting. 15 tablet 0  . pantoprazole (PROTONIX) 40 MG tablet Take 1 tablet (40 mg total) by mouth daily. (Patient not taking: Reported on 10/08/2020) 90 tablet 1  . rosuvastatin (CRESTOR) 10 MG tablet TAKE 1 TABLET BY MOUTH ONCE DAILY (Patient taking differently: Take 10 mg by mouth daily. ) 90 tablet 1   No current facility-administered medications on file prior to visit.   Allergies  Allergen Reactions  . Codeine Nausea And Vomiting and Other (See Comments)    Dizziness, also   Social History   Socioeconomic History  . Marital status: Divorced    Spouse name: Not on  file  . Number of children: 5  . Years of education: 12th grade  . Highest education level: Not on file  Occupational History  . Occupation: Retired  Tobacco Use  . Smoking status: Never Smoker  . Smokeless tobacco: Never Used  Vaping Use  . Vaping Use: Never used  Substance and Sexual Activity  . Alcohol use: Yes  . Drug use: No  . Sexual activity: Yes  Other Topics Concern  . Not on file  Social History Narrative   Lives with her son.   Right-handed.   Four living children.   Occasional caffeine use.   Social Determinants of Health   Financial Resource Strain: Not on file  Food Insecurity: Not on file  Transportation Needs: Not on file  Physical Activity: Not on file  Stress: Not on file  Social Connections: Not on file  Intimate Partner Violence: Not on file     Review  of Systems  All other systems reviewed and are negative.      Objective:   Physical Exam Vitals reviewed.  Constitutional:      General: She is not in acute distress.    Appearance: Normal appearance. She is normal weight. She is not ill-appearing, toxic-appearing or diaphoretic.  Eyes:     Extraocular Movements: Extraocular movements intact.     Pupils: Pupils are equal, round, and reactive to light.  Cardiovascular:     Rate and Rhythm: Normal rate and regular rhythm.     Pulses: Normal pulses.     Heart sounds: Normal heart sounds. No murmur heard. No friction rub. No gallop.   Pulmonary:     Effort: Pulmonary effort is normal. No respiratory distress.     Breath sounds: Normal breath sounds. No stridor. No wheezing, rhonchi or rales.  Abdominal:     General: Abdomen is flat. Bowel sounds are normal.     Palpations: Abdomen is soft.  Musculoskeletal:     Lumbar back: Spasms and tenderness present. No bony tenderness. Decreased range of motion.       Back:  Neurological:     General: No focal deficit present.     Mental Status: She is alert and oriented to person, place, and time. Mental status is at baseline.     Cranial Nerves: No cranial nerve deficit.     Sensory: No sensory deficit.     Motor: No weakness.     Coordination: Coordination normal.     Gait: Gait normal.     Deep Tendon Reflexes: Reflexes normal.  Psychiatric:        Mood and Affect: Mood normal.        Behavior: Behavior normal.        Thought Content: Thought content normal.        Judgment: Judgment normal.           Assessment & Plan:  Acute midline low back pain without sciatica - Plan: DG Lumbar Spine Complete  I believe the patient most likely pulled a muscle.  Use Tylenol for pain and then supplement with tizanidine 2 to 4 mg every 6 hours as needed for muscle spasms.  Anticipate self-limited resolution in 2 to 4 weeks.  Obtain x-ray to rule out vertebral fractures given her fall in  November

## 2020-11-16 ENCOUNTER — Telehealth: Payer: Self-pay | Admitting: *Deleted

## 2020-11-16 NOTE — Telephone Encounter (Signed)
Eliquis assistance has been faxed.  

## 2020-12-08 ENCOUNTER — Telehealth: Payer: Self-pay | Admitting: Cardiovascular Disease

## 2020-12-08 DIAGNOSIS — I480 Paroxysmal atrial fibrillation: Secondary | ICD-10-CM

## 2020-12-08 NOTE — Telephone Encounter (Signed)
Spoke with patient regarding Eliquis Patient assistance denied application, 3-4 tablets left  She can not afford high deductible plus copay high Would like alternative medication  Will forward to Dr Royann Shivers for review

## 2020-12-08 NOTE — Telephone Encounter (Signed)
Pt c/o medication issue:  1. Name of Medication: apixaban (ELIQUIS) 5 MG TABS tablet  2. How are you currently taking this medication (dosage and times per day)? As prescribed   3. Are you having a reaction (difficulty breathing--STAT)? No   4. What is your medication issue? Christine Hogan is calling requesting a callback from a nurse to discuss being switched to an alternative medication due to being denied assistance and not being able to afford it at regular price.

## 2020-12-08 NOTE — Telephone Encounter (Signed)
Belenda Cruise or Raquel or Thayer Ohm - any suggestions? Otherwise need to switch to warfarin

## 2020-12-08 NOTE — Telephone Encounter (Signed)
OK, will ahead w transition to warfarin

## 2020-12-08 NOTE — Telephone Encounter (Signed)
Unfortunately I don't have any other options for the high costs of anticoagulants.   Can't find a health fund that will help absorb these costs.  Warfarin is really her only other choice.  I would suggest giving her 2 weeks of samples and that will give [place] a little time to schedule appointment and explain everything to her.  Just let us know.

## 2020-12-09 NOTE — Telephone Encounter (Signed)
The patient has agreed to start warfarin.   Two weeks of samples has been left for her.  Medication Samples have been provided to the patient.  Drug name: Eliquis       Strength: 5mg         Qty: 2 boxes  LOT:  Exp.Date: 4/24

## 2020-12-09 NOTE — Telephone Encounter (Signed)
Called and lmom pt that we need a callback regarding where she wants 5mg  warfarin sent to as she will need to overlap warfarin and eliquis for 3 days on 2/14, 2/15, & lastly 2/16, after that the pt will just take one tablet of 5mg  warfarin once daily after supper. The patient will also need an appt for coumadin on the 18th. And a rx sent in to her pharmacy preference upon her call back.

## 2020-12-16 MED ORDER — WARFARIN SODIUM 5 MG PO TABS: 5.0000 mg | ORAL_TABLET | Freq: Every day | ORAL | 0 refills | Status: DC

## 2020-12-16 NOTE — Telephone Encounter (Signed)
Patient returning Haleigh's Call to provide preferred pharmacy

## 2020-12-16 NOTE — Telephone Encounter (Signed)
Returned a call to pt and explained dosing schedule, rx sent, pt voiced undrestanding

## 2020-12-16 NOTE — Addendum Note (Signed)
Addended by: Eather Colas on: 12/16/2020 10:28 AM   Modules accepted: Orders

## 2020-12-20 ENCOUNTER — Other Ambulatory Visit: Payer: Self-pay | Admitting: Family Medicine

## 2020-12-22 ENCOUNTER — Other Ambulatory Visit: Payer: Self-pay

## 2020-12-22 ENCOUNTER — Ambulatory Visit (INDEPENDENT_AMBULATORY_CARE_PROVIDER_SITE_OTHER): Payer: Medicare Other

## 2020-12-22 DIAGNOSIS — Z7901 Long term (current) use of anticoagulants: Secondary | ICD-10-CM | POA: Diagnosis not present

## 2020-12-22 DIAGNOSIS — I480 Paroxysmal atrial fibrillation: Secondary | ICD-10-CM | POA: Diagnosis not present

## 2020-12-22 LAB — POCT INR: INR: 1.3 — AB (ref 2.0–3.0)

## 2020-12-22 NOTE — Patient Instructions (Signed)
Take 2 tablets today only and then take 1 tablet Daily.  INR 1 week.  A full discussion of the nature of anticoagulants has been carried out.  A benefit risk analysis has been presented to the patient, so that they understand the justification for choosing anticoagulation at this time. The need for frequent and regular monitoring, precise dosage adjustment and compliance is stressed.  Side effects of potential bleeding are discussed.  The patient should avoid any OTC items containing aspirin or ibuprofen, and should avoid great swings in general diet.  Avoid alcohol consumption.  Call if any signs of abnormal bleeding.  [phone]

## 2020-12-29 ENCOUNTER — Other Ambulatory Visit: Payer: Self-pay

## 2020-12-29 ENCOUNTER — Ambulatory Visit (INDEPENDENT_AMBULATORY_CARE_PROVIDER_SITE_OTHER): Payer: Medicare Other

## 2020-12-29 DIAGNOSIS — I480 Paroxysmal atrial fibrillation: Secondary | ICD-10-CM

## 2020-12-29 DIAGNOSIS — Z7901 Long term (current) use of anticoagulants: Secondary | ICD-10-CM

## 2020-12-29 LAB — POCT INR: INR: 1.7 — AB (ref 2.0–3.0)

## 2020-12-29 MED ORDER — APIXABAN 5 MG PO TABS
5.0000 mg | ORAL_TABLET | Freq: Two times a day (BID) | ORAL | 1 refills | Status: DC
Start: 2020-12-29 — End: 2021-04-27

## 2020-12-29 NOTE — Patient Instructions (Signed)
Start Eliquis evening of 2/23

## 2021-02-24 ENCOUNTER — Other Ambulatory Visit: Payer: Self-pay | Admitting: Family Medicine

## 2021-03-02 NOTE — Progress Notes (Unsigned)
Subjective:   Christine Hogan is a 75 y.o. female who presents for Medicare Annual (Subsequent) preventive examination.  Review of Systems    N/A        Objective:    There were no vitals filed for this visit. There is no height or weight on file to calculate BMI.  Advanced Directives 09/12/2020 05/13/2020 01/02/2016  Does Patient Have a Medical Advance Directive? No No No  Would patient like information on creating a medical advance directive? - - No - patient declined information    Current Medications (verified) Outpatient Encounter Medications as of 03/04/2021  Medication Sig  . amLODipine (NORVASC) 10 MG tablet TAKE 1 TABLET BY MOUTH  DAILY  . apixaban (ELIQUIS) 5 MG TABS tablet Take 1 tablet (5 mg total) by mouth 2 (two) times daily.  . hydrochlorothiazide (HYDRODIURIL) 12.5 MG tablet Take 1 tablet (12.5 mg total) by mouth daily.  Marland Kitchen LORazepam (ATIVAN) 1 MG tablet Take 1 tablet (1 mg total) by mouth 3 (three) times daily as needed for anxiety.  Marland Kitchen losartan (COZAAR) 100 MG tablet Take 1 tablet (100 mg total) by mouth daily.  . methocarbamol (ROBAXIN) 500 MG tablet Take 1 tablet (500 mg total) by mouth 3 (three) times daily.  . metoprolol tartrate (LOPRESSOR) 50 MG tablet Take 1 tablet (50 mg total) by mouth 2 (two) times daily.  . Multiple Vitamins-Minerals (CENTRUM SILVER 50+WOMEN) TABS Take 1 tablet by mouth daily with breakfast.  . nitroGLYCERIN (NITROSTAT) 0.4 MG SL tablet Place 1 tablet (0.4 mg total) under the tongue every 5 (five) minutes as needed for chest pain.  Marland Kitchen ondansetron (ZOFRAN-ODT) 4 MG disintegrating tablet Take 1 tablet (4 mg total) by mouth every 8 (eight) hours as needed for nausea or vomiting.  . pantoprazole (PROTONIX) 40 MG tablet TAKE 1 TABLET BY MOUTH  DAILY  . rosuvastatin (CRESTOR) 10 MG tablet TAKE 1 TABLET BY MOUTH ONCE DAILY  . tiZANidine (ZANAFLEX) 4 MG tablet Take 1 tablet (4 mg total) by mouth every 6 (six) hours as needed for muscle spasms.   No  facility-administered encounter medications on file as of 03/04/2021.    Allergies (verified) Codeine   History: Past Medical History:  Diagnosis Date  . Arthritis   . Atrial fibrillation (HCC)   . Chest pain    a. 2014: NST with no evidence of ischemia  . CKD (chronic kidney disease) stage 3, GFR 30-59 ml/min (HCC)   . Diastolic dysfunction    grade 2 with dyspnea on exertion  . H/O echocardiogram    a. 2014: echo showing EF of 65-70% with Grade 1 DD, moderate TR, and mild to moderate MR  . Hyperlipidemia   . Hypertension   . Near syncope   . Osteopenia    Past Surgical History:  Procedure Laterality Date  . ABDOMINAL HYSTERECTOMY     TAH/BSO (menorrhagia)  . bunion repair    . HERNIA REPAIR     Family History  Problem Relation Age of Onset  . Hypertension Father   . Heart disease Father        pacemaker- followedby Dr. Royann Shivers  . Diabetes Sister   . Hypertension Sister   . Diabetes Sister   . Hypertension Sister   . Hypertension Daughter   . Hypertension Daughter   . Kidney failure Mother    Social History   Socioeconomic History  . Marital status: Divorced    Spouse name: Not on file  . Number of children:  5  . Years of education: 12th grade  . Highest education level: Not on file  Occupational History  . Occupation: Retired  Tobacco Use  . Smoking status: Never Smoker  . Smokeless tobacco: Never Used  Vaping Use  . Vaping Use: Never used  Substance and Sexual Activity  . Alcohol use: Yes  . Drug use: No  . Sexual activity: Yes  Other Topics Concern  . Not on file  Social History Narrative   Lives with her son.   Right-handed.   Four living children.   Occasional caffeine use.   Social Determinants of Health   Financial Resource Strain: Not on file  Food Insecurity: Not on file  Transportation Needs: Not on file  Physical Activity: Not on file  Stress: Not on file  Social Connections: Not on file    Tobacco Counseling Counseling  given: Not Answered   Clinical Intake:                 Diabetic?No          Activities of Daily Living No flowsheet data found.  Patient Care Team: Donita Brooks, MD as PCP - General (Family Medicine) Croitoru, Rachelle Hora, MD as PCP - Cardiology (Cardiology)  Indicate any recent Medical Services you may have received from other than [facility] providers in the past year (date may be approximate).     Assessment:   This is a routine wellness examination for Christine Hogan.  Hearing/Vision screen No exam data present  Dietary issues and exercise activities discussed:    Goals   None    Depression Screen PHQ 2/9 Scores 12/24/2017 12/24/2017 07/06/2017 05/12/2014  PHQ - 2 Score 0 0 0 0    Fall Risk Fall Risk  12/24/2017 12/24/2017 05/12/2014  Falls in the past year? No No No    FALL RISK PREVENTION PERTAINING TO THE HOME:  Any stairs in or around the home? {YES/NO:21197} If so, are there any without handrails? No  Home free of loose throw rugs in walkways, pet beds, electrical cords, etc? Yes  Adequate lighting in your home to reduce risk of falls? Yes   ASSISTIVE DEVICES UTILIZED TO PREVENT FALLS:  Life alert? {YES/NO:21197} Use of a cane, walker or w/c? {YES/NO:21197} Grab bars in the bathroom? {YES/NO:21197} Shower chair or bench in shower? {YES/NO:21197} Elevated toilet seat or a handicapped toilet? {YES/NO:21197}  TIMED UP AND GO:  Was the test performed? Yes .  Length of time to ambulate 10 feet: *** sec.   {Appearance of KZLD:3570177}  Cognitive Function:        Immunizations Immunization History  Administered Date(s) Administered  . Influenza,inj,Quad PF,6+ Mos 12/24/2017, 09/22/2018  . Pneumococcal Conjugate-13 05/12/2014  . Pneumococcal Polysaccharide-23 12/24/2017  . Tdap 01/02/2016    TDAP status: Up to date  {Flu Vaccine status:2101806}  {Pneumococcal vaccine status:2101807}  {Covid-19 vaccine status:2101808}  Qualifies for Shingles  Vaccine? Yes   Zostavax completed No   Shingrix Completed?: No.    Education has been provided regarding the importance of this vaccine. Patient has been advised to call insurance company to determine out of pocket expense if they have not yet received this vaccine. Advised may also receive vaccine at local pharmacy or Health Dept. Verbalized acceptance and understanding.  Screening Tests Health Maintenance  Topic Date Due  . Hepatitis C Screening  Never done  . COVID-19 Vaccine (1) Never done  . COLONOSCOPY (Pts 45-54yrs Insurance coverage will need to be confirmed)  Never done  .  MAMMOGRAM  01/30/2020  . INFLUENZA VACCINE  06/06/2021  . TETANUS/TDAP  01/01/2026  . DEXA SCAN  Completed  . PNA vac Low Risk Adult  Completed  . HPV VACCINES  Aged Out    Health Maintenance  Health Maintenance Due  Topic Date Due  . Hepatitis C Screening  Never done  . COVID-19 Vaccine (1) Never done  . COLONOSCOPY (Pts 45-63yrs Insurance coverage will need to be confirmed)  Never done  . MAMMOGRAM  01/30/2020    {Colorectal cancer screening:2101809}  {Mammogram status:21018020}  Bone Density status: Completed 01/29/2018. Results reflect: Bone density results: OSTEOPENIA. Repeat every 5 years.  Lung Cancer Screening: (Low Dose CT Chest recommended if Age 73-80 years, 30 pack-year currently smoking OR have quit w/in 15years.) does not qualify.   Lung Cancer Screening Referral: N/A   Additional Screening:  Hepatitis C Screening: does qualify;   Vision Screening: Recommended annual ophthalmology exams for early detection of glaucoma and other disorders of the eye. Is the patient up to date with their annual eye exam?  {YES/NO:21197} Who is the provider or what is the name of the office in which the patient attends annual eye exams? *** If pt is not established with a provider, would they like to be referred to a provider to establish care? {YES/NO:21197}.   Dental Screening: Recommended annual  dental exams for proper oral hygiene  Community Resource Referral / Chronic Care Management: CRR required this visit?  No   CCM required this visit?  No      Plan:     I have personally reviewed and noted the following in the patient's chart:   . Medical and social history . Use of alcohol, tobacco or illicit drugs  . Current medications and supplements . Functional ability and status . Nutritional status . Physical activity . Advanced directives . List of other physicians . Hospitalizations, surgeries, and ER visits in previous 12 months . Vitals . Screenings to include cognitive, depression, and falls . Referrals and appointments  In addition, I have reviewed and discussed with patient certain preventive protocols, quality metrics, and best practice recommendations. A written personalized care plan for preventive services as well as general preventive health recommendations were provided to patient.     Theodora Blow, LPN   7/51/7001   Nurse Notes: None

## 2021-03-04 ENCOUNTER — Ambulatory Visit (INDEPENDENT_AMBULATORY_CARE_PROVIDER_SITE_OTHER): Payer: Medicare Other

## 2021-03-04 ENCOUNTER — Ambulatory Visit: Payer: Medicare Other

## 2021-03-04 DIAGNOSIS — Z1211 Encounter for screening for malignant neoplasm of colon: Secondary | ICD-10-CM | POA: Diagnosis not present

## 2021-03-04 DIAGNOSIS — Z0000 Encounter for general adult medical examination without abnormal findings: Secondary | ICD-10-CM

## 2021-03-04 DIAGNOSIS — Z1231 Encounter for screening mammogram for malignant neoplasm of breast: Secondary | ICD-10-CM

## 2021-03-04 NOTE — Progress Notes (Addendum)
Subjective:   Christine Hogan is a 75 y.o. female who presents for Medicare Annual (Subsequent) preventive examination.  I connected with Evalynn Hankins  today by telephone and verified that I am speaking with the correct person using two identifiers. Location patient: home Location provider: work Persons participating in the virtual visit: patient, provider.   I discussed the limitations, risks, security and privacy concerns of performing an evaluation and management service by telephone and the availability of in person appointments. I also discussed with the patient that there may be a patient responsible charge related to this service. The patient expressed understanding and verbally consented to this telephonic visit.    Interactive audio and video telecommunications were attempted between this provider and patient, however failed, due to patient having technical difficulties OR patient did not have access to video capability.  We continued and completed visit with audio only.      Review of Systems    N/A Cardiac Risk Factors include: advanced age (>49men, >53 women);hypertension;dyslipidemia     Objective:    Today's Vitals   03/04/21 1543  PainSc: 6    There is no height or weight on file to calculate BMI.  Advanced Directives 03/04/2021 09/12/2020 05/13/2020 01/02/2016  Does Patient Have a Medical Advance Directive? No No No No  Would patient like information on creating a medical advance directive? No - Patient declined - - No - patient declined information    Current Medications (verified) Outpatient Encounter Medications as of 03/04/2021  Medication Sig  . amLODipine (NORVASC) 10 MG tablet TAKE 1 TABLET BY MOUTH  DAILY  . apixaban (ELIQUIS) 5 MG TABS tablet Take 1 tablet (5 mg total) by mouth 2 (two) times daily.  . hydrochlorothiazide (HYDRODIURIL) 12.5 MG tablet Take 1 tablet (12.5 mg total) by mouth daily.  Marland Kitchen LORazepam (ATIVAN) 1 MG tablet Take 1 tablet (1 mg total) by  mouth 3 (three) times daily as needed for anxiety.  Marland Kitchen losartan (COZAAR) 100 MG tablet Take 1 tablet (100 mg total) by mouth daily.  . metoprolol tartrate (LOPRESSOR) 50 MG tablet Take 1 tablet (50 mg total) by mouth 2 (two) times daily.  . Multiple Vitamins-Minerals (CENTRUM SILVER 50+WOMEN) TABS Take 1 tablet by mouth daily with breakfast.  . ondansetron (ZOFRAN-ODT) 4 MG disintegrating tablet Take 1 tablet (4 mg total) by mouth every 8 (eight) hours as needed for nausea or vomiting.  . pantoprazole (PROTONIX) 40 MG tablet TAKE 1 TABLET BY MOUTH  DAILY  . rosuvastatin (CRESTOR) 10 MG tablet TAKE 1 TABLET BY MOUTH ONCE DAILY  . methocarbamol (ROBAXIN) 500 MG tablet Take 1 tablet (500 mg total) by mouth 3 (three) times daily. (Patient not taking: Reported on 03/04/2021)  . nitroGLYCERIN (NITROSTAT) 0.4 MG SL tablet Place 1 tablet (0.4 mg total) under the tongue every 5 (five) minutes as needed for chest pain. (Patient not taking: Reported on 03/04/2021)  . tiZANidine (ZANAFLEX) 4 MG tablet Take 1 tablet (4 mg total) by mouth every 6 (six) hours as needed for muscle spasms. (Patient not taking: Reported on 03/04/2021)   No facility-administered encounter medications on file as of 03/04/2021.    Allergies (verified) Codeine   History: Past Medical History:  Diagnosis Date  . Arthritis   . Atrial fibrillation (HCC)   . Chest pain    a. 2014: NST with no evidence of ischemia  . CKD (chronic kidney disease) stage 3, GFR 30-59 ml/min (HCC)   . Diastolic dysfunction  grade 2 with dyspnea on exertion  . H/O echocardiogram    a. 2014: echo showing EF of 65-70% with Grade 1 DD, moderate TR, and mild to moderate MR  . Hyperlipidemia   . Hypertension   . Near syncope   . Osteopenia    Past Surgical History:  Procedure Laterality Date  . ABDOMINAL HYSTERECTOMY     TAH/BSO (menorrhagia)  . bunion repair    . HERNIA REPAIR     Family History  Problem Relation Age of Onset  . Hypertension  Father   . Heart disease Father        pacemaker- followedby Dr. Royann Shivers  . Diabetes Sister   . Hypertension Sister   . Diabetes Sister   . Hypertension Sister   . Hypertension Daughter   . Hypertension Daughter   . Kidney failure Mother    Social History   Socioeconomic History  . Marital status: Divorced    Spouse name: Not on file  . Number of children: 5  . Years of education: 12th grade  . Highest education level: Not on file  Occupational History  . Occupation: Retired  Tobacco Use  . Smoking status: Never Smoker  . Smokeless tobacco: Never Used  Vaping Use  . Vaping Use: Never used  Substance and Sexual Activity  . Alcohol use: Yes  . Drug use: No  . Sexual activity: Yes  Other Topics Concern  . Not on file  Social History Narrative   Lives with her son.   Right-handed.   Four living children.   Occasional caffeine use.   Social Determinants of Health   Financial Resource Strain: Low Risk   . Difficulty of Paying Living Expenses: Not hard at all  Food Insecurity: No Food Insecurity  . Worried About Programme researcher, broadcasting/film/video in the Last Year: Never true  . Ran Out of Food in the Last Year: Never true  Transportation Needs: No Transportation Needs  . Lack of Transportation (Medical): No  . Lack of Transportation (Non-Medical): No  Physical Activity: Inactive  . Days of Exercise per Week: 0 days  . Minutes of Exercise per Session: 0 min  Stress: No Stress Concern Present  . Feeling of Stress : Not at all  Social Connections: Moderately Isolated  . Frequency of Communication with Friends and Family: More than three times a week  . Frequency of Social Gatherings with Friends and Family: More than three times a week  . Attends Religious Services: More than 4 times per year  . Active Member of Clubs or Organizations: No  . Attends Banker Meetings: Never  . Marital Status: Divorced    Tobacco Counseling Counseling given: Not  Answered   Clinical Intake:  Pre-visit preparation completed: Yes  Pain : 0-10 Pain Score: 6  Pain Type: Chronic pain Pain Location: Neck Pain Descriptors / Indicators: Aching Pain Onset: More than a month ago Pain Frequency: Intermittent     Nutritional Risks: None Diabetes: No  How often do you need to have someone help you when you read instructions, pamphlets, or other written materials from your doctor or pharmacy?: 1 - Never  Diabetic?No   Interpreter Needed?: No  Information entered by :: SCrews,LPN   Activities of Daily Living In your present state of health, do you have any difficulty performing the following activities: 03/04/2021  Hearing? N  Vision? N  Walking or climbing stairs? Y  Comment Pain and SOB when climbing stairs  Dressing or bathing?  N  Doing errands, shopping? N  Preparing Food and eating ? N  Using the Toilet? N  In the past six months, have you accidently leaked urine? N  Do you have problems with loss of bowel control? N  Managing your Medications? N  Managing your Finances? N  Housekeeping or managing your Housekeeping? N  Some recent data might be hidden    Patient Care Team: Donita Brooks, MD as PCP - General (Family Medicine) Croitoru, Rachelle Hora, MD as PCP - Cardiology (Cardiology)  Indicate any recent Medical Services you may have received from other than [facility] providers in the past year (date may be approximate).     Assessment:   This is a routine wellness examination for Ameriah.  Hearing/Vision screen  Hearing Screening   125Hz  250Hz  500Hz  1000Hz  2000Hz  3000Hz  4000Hz  6000Hz  8000Hz   Right ear:           Left ear:           Vision Screening Comments: Patient states gets eyes examined once per year. Currently wears glasses  Dietary issues and exercise activities discussed: Current Exercise Habits: The patient does not participate in regular exercise at present  Goals    . Exercise 3x per week (30 min per time)    .  Reduce alcohol intake      Depression Screen PHQ 2/9 Scores 03/04/2021 12/24/2017 12/24/2017 07/06/2017 05/12/2014  PHQ - 2 Score 0 0 0 0 0    Fall Risk Fall Risk  03/04/2021 12/24/2017 12/24/2017 05/12/2014  Falls in the past year? 1 No No No  Number falls in past yr: 0 - - -  Injury with Fall? 0 - - -  Risk for fall due to : Impaired balance/gait - - -  Follow up Falls evaluation completed;Falls prevention discussed - - -    FALL RISK PREVENTION PERTAINING TO THE HOME:  Any stairs in or around the home? No  If so, are there any without handrails? No  Home free of loose throw rugs in walkways, pet beds, electrical cords, etc? Yes  Adequate lighting in your home to reduce risk of falls? Yes   ASSISTIVE DEVICES UTILIZED TO PREVENT FALLS:  Life alert? No  Use of a cane, walker or w/c? Yes  Grab bars in the bathroom? Yes  Shower chair or bench in shower? No  Elevated toilet seat or a handicapped toilet? Yes   Cognitive Function:     Normal cognitive status assessed by direct observation by this Nurse Health Advisor. No abnormalities found.      Immunizations Immunization History  Administered Date(s) Administered  . Influenza,inj,Quad PF,6+ Mos 12/24/2017, 09/22/2018  . Pneumococcal Conjugate-13 05/12/2014  . Pneumococcal Polysaccharide-23 12/24/2017  . Tdap 01/02/2016    TDAP status: Up to date  Flu Vaccine status: Due, Education has been provided regarding the importance of this vaccine. Advised may receive this vaccine at local pharmacy or Health Dept. Aware to provide a copy of the vaccination record if obtained from local pharmacy or Health Dept. Verbalized acceptance and understanding.  Pneumococcal vaccine status: Up to date  Covid-19 vaccine status: Completed vaccines  Qualifies for Shingles Vaccine? Yes   Zostavax completed No   Shingrix Completed?: No.    Education has been provided regarding the importance of this vaccine. Patient has been advised to call  insurance company to determine out of pocket expense if they have not yet received this vaccine. Advised may also receive vaccine at local pharmacy or Health Dept.  Verbalized acceptance and understanding.  Screening Tests Health Maintenance  Topic Date Due  . Hepatitis C Screening  Never done  . COVID-19 Vaccine (1) Never done  . COLONOSCOPY (Pts [id number] Insurance coverage will need to be confirmed)  Never done  . MAMMOGRAM  01/30/2020  . INFLUENZA VACCINE  06/06/2021  . TETANUS/TDAP  01/01/2026  . DEXA SCAN  Completed  . PNA vac Low Risk Adult  Completed  . HPV VACCINES  Aged Out    Health Maintenance  Health Maintenance Due  Topic Date Due  . Hepatitis C Screening  Never done  . COVID-19 Vaccine (1) Never done  . COLONOSCOPY (Pts [id number] Insurance coverage will need to be confirmed)  Never done  . MAMMOGRAM  01/30/2020    Colorectal cancer screening: Referral to GI placed 03/04/2021. Pt aware the office will call re: appt.  Mammogram status: Ordered 03/04/2021. Pt provided with contact info and advised to call to schedule appt.   Bone Density status: Completed 01/29/2018. Results reflect: Bone density results: OSTEOPENIA. Repeat every 5 years.  Lung Cancer Screening: (Low Dose CT Chest recommended if Age 64-80 years, 30 pack-year currently smoking OR have quit w/in 15years.) does not qualify.   Lung Cancer Screening Referral: N/A   Additional Screening:  Hepatitis C Screening: does qualify;   Vision Screening: Recommended annual ophthalmology exams for early detection of glaucoma and other disorders of the eye. Is the patient up to date with their annual eye exam?  Yes  Who is the provider or what is the name of the office in which the patient attends annual eye exams? [facility]  If pt is not established with a provider, would they like to be referred to a provider to establish care? No .   Dental Screening: Recommended annual dental exams for proper oral  hygiene  Community Resource Referral / Chronic Care Management: CRR required this visit?  No   CCM required this visit?  No      Plan:     I have personally reviewed and noted the following in the patient's chart:   . Medical and social history . Use of alcohol, tobacco or illicit drugs  . Current medications and supplements . Functional ability and status . Nutritional status . Physical activity . Advanced directives . List of other physicians . Hospitalizations, surgeries, and ER visits in previous 12 months . Vitals . Screenings to include cognitive, depression, and falls . Referrals and appointments  In addition, I have reviewed and discussed with patient certain preventive protocols, quality metrics, and best practice recommendations. A written personalized care plan for preventive services as well as general preventive health recommendations were provided to patient.     Theodora BlowShannon Bell Cai, LPN   1/61/09604/29/2022   Nurse Notes: None    I have collaborated with the care management provider regarding care management and care coordination activities outlined in this encounter and have reviewed this encounter including documentation in the note and care plan. I am certifying that I agree with the content of this note and encounter as supervising physician.

## 2021-03-04 NOTE — Patient Instructions (Signed)
Christine Hogan , Thank you for taking time to come for your Medicare Wellness Visit. I appreciate your ongoing commitment to your health goals. Please review the following plan we discussed and let me know if I can assist you in the future.   Screening recommendations/referrals: Colonoscopy: Currently due, referral placed this visit Mammogram: Currently due, orders placed this visit  Bone Density: Up to date, next due 01/30/2023 Recommended yearly ophthalmology/optometry visit for glaucoma screening and checkup Recommended yearly dental visit for hygiene and checkup  Vaccinations: Influenza vaccine: Currently due, you may receive in the office or at your local pharmacy. Pneumococcal vaccine: Completed series  Tdap vaccine: Up to date, next due 01/01/2026 Shingles vaccine: Currently due for Shingrix, if you would like to receive we recommend that you do so at your local pharmacy as it is less expensive     Advanced directives: Advance directive discussed with you today. Even though you declined this today please call our office should you change your mind and we can give you the proper paperwork for you to fill out.   Conditions/risks identified: We discussed cutting back on your alcohol consumption and trying to exercise at least 30 minutes 3 days per week such as taking a brisk walk.  Next appointment: 03/09/2022 @ 9:00 am with Nurse Health Advisor   Preventive Care 65 Years and Older, Female Preventive care refers to lifestyle choices and visits with your health care provider that can promote health and wellness. What does preventive care include?  A yearly physical exam. This is also called an annual well check.  Dental exams once or twice a year.  Routine eye exams. Ask your health care provider how often you should have your eyes checked.  Personal lifestyle choices, including:  Daily care of your teeth and gums.  Regular physical activity.  Eating a healthy diet.  Avoiding  tobacco and drug use.  Limiting alcohol use.  Practicing safe sex.  Taking low-dose aspirin every day.  Taking vitamin and mineral supplements as recommended by your health care provider. What happens during an annual well check? The services and screenings done by your health care provider during your annual well check will depend on your age, overall health, lifestyle risk factors, and family history of disease. Counseling  Your health care provider may ask you questions about your:  Alcohol use.  Tobacco use.  Drug use.  Emotional well-being.  Home and relationship well-being.  Sexual activity.  Eating habits.  History of falls.  Memory and ability to understand (cognition).  Work and work Astronomer.  Reproductive health. Screening  You may have the following tests or measurements:  Height, weight, and BMI.  Blood pressure.  Lipid and cholesterol levels. These may be checked every 5 years, or more frequently if you are over 101 years old.  Skin check.  Lung cancer screening. You may have this screening every year starting at age 14 if you have a 30-pack-year history of smoking and currently smoke or have quit within the past 15 years.  Fecal occult blood test (FOBT) of the stool. You may have this test every year starting at age 34.  Flexible sigmoidoscopy or colonoscopy. You may have a sigmoidoscopy every 5 years or a colonoscopy every 10 years starting at age 7.  Hepatitis C blood test.  Hepatitis B blood test.  Sexually transmitted disease (STD) testing.  Diabetes screening. This is done by checking your blood sugar (glucose) after you have not eaten for a while (fasting).  You may have this done every 1-3 years.  Bone density scan. This is done to screen for osteoporosis. You may have this done starting at age 39.  Mammogram. This may be done every 1-2 years. Talk to your health care provider about how often you should have regular  mammograms. Talk with your health care provider about your test results, treatment options, and if necessary, the need for more tests. Vaccines  Your health care provider may recommend certain vaccines, such as:  Influenza vaccine. This is recommended every year.  Tetanus, diphtheria, and acellular pertussis (Tdap, Td) vaccine. You may need a Td booster every 10 years.  Zoster vaccine. You may need this after age 32.  Pneumococcal 13-valent conjugate (PCV13) vaccine. One dose is recommended after age 76.  Pneumococcal polysaccharide (PPSV23) vaccine. One dose is recommended after age 48. Talk to your health care provider about which screenings and vaccines you need and how often you need them. This information is not intended to replace advice given to you by your health care provider. Make sure you discuss any questions you have with your health care provider. Document Released: 11/19/2015 Document Revised: 07/12/2016 Document Reviewed: 08/24/2015 Elsevier Interactive Patient Education  2017 [place] Prevention in the Home Falls can cause injuries. They can happen to people of all ages. There are many things you can do to make your home safe and to help prevent falls. What can I do on the outside of my home?  Regularly fix the edges of walkways and driveways and fix any cracks.  Remove anything that might make you trip as you walk through a door, such as a raised step or threshold.  Trim any bushes or trees on the path to your home.  Use bright outdoor lighting.  Clear any walking paths of anything that might make someone trip, such as rocks or tools.  Regularly check to see if handrails are loose or broken. Make sure that both sides of any steps have handrails.  Any raised decks and porches should have guardrails on the edges.  Have any leaves, snow, or ice cleared regularly.  Use sand or salt on walking paths during winter.  Clean up any spills in your garage  right away. This includes oil or grease spills. What can I do in the bathroom?  Use night lights.  Install grab bars by the toilet and in the tub and shower. Do not use towel bars as grab bars.  Use non-skid mats or decals in the tub or shower.  If you need to sit down in the shower, use a plastic, non-slip stool.  Keep the floor dry. Clean up any water that spills on the floor as soon as it happens.  Remove soap buildup in the tub or shower regularly.  Attach bath mats securely with double-sided non-slip rug tape.  Do not have throw rugs and other things on the floor that can make you trip. What can I do in the bedroom?  Use night lights.  Make sure that you have a light by your bed that is easy to reach.  Do not use any sheets or blankets that are too big for your bed. They should not hang down onto the floor.  Have a firm chair that has side arms. You can use this for support while you get dressed.  Do not have throw rugs and other things on the floor that can make you trip. What can I do in the kitchen?  Clean up any spills right away.  Avoid walking on wet floors.  Keep items that you use a lot in easy-to-reach places.  If you need to reach something above you, use a strong step stool that has a grab bar.  Keep electrical cords out of the way.  Do not use floor polish or wax that makes floors slippery. If you must use wax, use non-skid floor wax.  Do not have throw rugs and other things on the floor that can make you trip. What can I do with my stairs?  Do not leave any items on the stairs.  Make sure that there are handrails on both sides of the stairs and use them. Fix handrails that are broken or loose. Make sure that handrails are as long as the stairways.  Check any carpeting to make sure that it is firmly attached to the stairs. Fix any carpet that is loose or worn.  Avoid having throw rugs at the top or bottom of the stairs. If you do have throw rugs,  attach them to the floor with carpet tape.  Make sure that you have a light switch at the top of the stairs and the bottom of the stairs. If you do not have them, ask someone to add them for you. What else can I do to help prevent falls?  Wear shoes that:  Do not have high heels.  Have rubber bottoms.  Are comfortable and fit you well.  Are closed at the toe. Do not wear sandals.  If you use a stepladder:  Make sure that it is fully opened. Do not climb a closed stepladder.  Make sure that both sides of the stepladder are locked into place.  Ask someone to hold it for you, if possible.  Clearly mark and make sure that you can see:  Any grab bars or handrails.  First and last steps.  Where the edge of each step is.  Use tools that help you move around (mobility aids) if they are needed. These include:  Canes.  Walkers.  Scooters.  Crutches.  Turn on the lights when you go into a dark area. Replace any light bulbs as soon as they burn out.  Set up your furniture so you have a clear path. Avoid moving your furniture around.  If any of your floors are uneven, fix them.  If there are any pets around you, be aware of where they are.  Review your medicines with your doctor. Some medicines can make you feel dizzy. This can increase your chance of falling. Ask your doctor what other things that you can do to help prevent falls. This information is not intended to replace advice given to you by your health care provider. Make sure you discuss any questions you have with your health care provider. Document Released: 08/19/2009 Document Revised: 03/30/2016 Document Reviewed: 11/27/2014 Elsevier Interactive Patient Education  2017 [employer].

## 2021-04-26 ENCOUNTER — Encounter: Payer: Self-pay | Admitting: Nurse Practitioner

## 2021-04-27 ENCOUNTER — Other Ambulatory Visit: Payer: Self-pay | Admitting: Cardiovascular Disease

## 2021-04-27 NOTE — Telephone Encounter (Signed)
Prescription refill request for Eliquis received. Indication:atrial fib Last office visit:9/21 Scr:1.6 Age: 74 Weight:72.6 kg  Prescription refilled

## 2021-04-29 ENCOUNTER — Telehealth (INDEPENDENT_AMBULATORY_CARE_PROVIDER_SITE_OTHER): Payer: Medicare Other | Admitting: Nurse Practitioner

## 2021-04-29 ENCOUNTER — Other Ambulatory Visit: Payer: Self-pay

## 2021-04-29 DIAGNOSIS — J069 Acute upper respiratory infection, unspecified: Secondary | ICD-10-CM | POA: Diagnosis not present

## 2021-04-29 MED ORDER — GUAIFENESIN ER 600 MG PO TB12: 600.0000 mg | ORAL_TABLET | Freq: Two times a day (BID) | ORAL | 0 refills | Status: DC | PRN

## 2021-04-29 NOTE — Progress Notes (Signed)
Subjective:    Patient ID: Christine Hogan, female    DOB: [birth year], 75 y.o.   MRN: [id number]  HPI: Christine Hogan is a 75 y.o. female presenting virtually for cough.  Chief Complaint  Patient presents with   Cough    Cough for 1 wk with some phlegm, feels like something in throat, doing hot tea with ginger and lemon, no meds taken for cough, Denies other sx, covid vaccine updated   UPPER RESPIRATORY TRACT INFECTION Onset: 10 days ago  COVID-19 testing history: yes - negative COVID-19 vaccination status: Had has 3 shots; September 02, 2020 Fever: no Cough: yes; worse and then better - thick mucus dark yellow Shortness of breath: no Wheezing: yes Chest pain: no Chest tightness: no Chest congestion: yes Nasal congestion: no Runny nose: yes Post nasal drip: yes Sneezing: no Sore throat: no Swollen glands: no Sinus pressure: no Headache: no Face pain: no Toothache: no Ear pain: no  Ear pressure: no  Eyes red/itching:no Eye drainage/crusting: no  Nausea: no  Vomiting: no Diarrhea: no  Change in appetite: no change  Loss of taste/smell: no  Rash: no Fatigue: yes Sick contacts: no Strep contacts: no  Context: better Recurrent sinusitis: no Treatments attempted: Coricidin, lemon tea with ginger and eating lemon Relief with OTC medications: yes  Allergies  Allergen Reactions   Codeine Nausea And Vomiting and Other (See Comments)    Dizziness, also    Outpatient Encounter Medications as of 04/29/2021  Medication Sig Note   amLODipine (NORVASC) 10 MG tablet TAKE 1 TABLET BY MOUTH  DAILY    ELIQUIS 5 MG TABS tablet TAKE 1 TABLET BY MOUTH  TWICE DAILY    guaiFENesin (MUCINEX) 600 MG 12 hr tablet Take 1 tablet (600 mg total) by mouth 2 (two) times daily as needed for cough or to loosen phlegm.    hydrochlorothiazide (HYDRODIURIL) 12.5 MG tablet Take 1 tablet (12.5 mg total) by mouth daily.    losartan (COZAAR) 100 MG tablet Take 1 tablet (100 mg total) by mouth daily.     metoprolol tartrate (LOPRESSOR) 50 MG tablet Take 1 tablet (50 mg total) by mouth 2 (two) times daily. 09/12/2020: The patient remarked she makes sure she always gets 1 dose in per day- sometimes forgets her 2nd dose   Multiple Vitamins-Minerals (CENTRUM SILVER 50+WOMEN) TABS Take 1 tablet by mouth daily with breakfast.    nitroGLYCERIN (NITROSTAT) 0.4 MG SL tablet Place 1 tablet (0.4 mg total) under the tongue every 5 (five) minutes as needed for chest pain. 09/12/2020: Provider: The patient's medication has EXPIRED- will need a new Rx if she is to continue carrying this   ondansetron (ZOFRAN-ODT) 4 MG disintegrating tablet Take 1 tablet (4 mg total) by mouth every 8 (eight) hours as needed for nausea or vomiting.    pantoprazole (PROTONIX) 40 MG tablet TAKE 1 TABLET BY MOUTH  DAILY    rosuvastatin (CRESTOR) 10 MG tablet TAKE 1 TABLET BY MOUTH ONCE DAILY    tiZANidine (ZANAFLEX) 4 MG tablet Take 1 tablet (4 mg total) by mouth every 6 (six) hours as needed for muscle spasms.    LORazepam (ATIVAN) 1 MG tablet Take 1 tablet (1 mg total) by mouth 3 (three) times daily as needed for anxiety. (Patient not taking: Reported on 04/29/2021)    methocarbamol (ROBAXIN) 500 MG tablet Take 1 tablet (500 mg total) by mouth 3 (three) times daily. (Patient not taking: No sig reported)    No facility-administered  encounter medications on file as of 04/29/2021.    Patient Active Problem List   Diagnosis Date Noted   CKD (chronic kidney disease) stage 3, GFR 30-59 ml/min (HCC)    Abnormal finding on MRI of brain 09/15/2019   Alteration of consciousness 08/26/2019   Syncope 05/26/2019   Nonrheumatic mitral (valve) insufficiency 10/08/2018   Vasovagal syncope 10/08/2018   Osteopenia    Diastolic dysfunction    Paroxysmal atrial fibrillation (HCC) 02/09/2017   Chronic renal insufficiency 06/28/2013   Chest pain 06/27/2013   Essential hypertension    Mixed hyperlipidemia     Past Medical History:  Diagnosis  Date   Arthritis    Atrial fibrillation (HCC)    Chest pain    a. 2014: NST with no evidence of ischemia   CKD (chronic kidney disease) stage 3, GFR 30-59 ml/min (HCC)    Diastolic dysfunction    grade 2 with dyspnea on exertion   H/O echocardiogram    a. 2014: echo showing EF of 65-70% with Grade 1 DD, moderate TR, and mild to moderate MR   Hyperlipidemia    Hypertension    Near syncope    Osteopenia     Relevant past medical, surgical, family and social history reviewed and updated as indicated. Interim medical history since our last visit reviewed.  Review of Systems Per HPI unless specifically indicated above     Objective:    There were no vitals taken for this visit.  Wt Readings from Last 3 Encounters:  11/02/20 160 lb (72.6 kg)  10/08/20 157 lb (71.2 kg)  09/21/20 161 lb (73 kg)    Physical Exam Physical examination unable to be performed due to lack of equipment.  Patient talking in complete sentences during telemedicine visit.  Results for orders placed or performed in visit on 12/29/20  POCT INR  Result Value Ref Range   INR 1.7 (A) 2.0 - 3.0      Assessment & Plan:  1. Upper respiratory tract infection, unspecified type Acute.  Likely viral in etiology-COVID testing at home was negative.  Given symptoms are improving, even though they have been going on for more than 7 days, this is unlikely pneumonia as symptoms are improving.  She does not have a history of COPD or asthma.  Educated that cough may last for up to 6 weeks after acute viral infection. Encouraged use of guaifenesin to help thin secretions and bring mucus up.  Follow-up if symptoms do not continue to improve or if they worsen.  Consider chest x-ray at that point.  - guaiFENesin (MUCINEX) 600 MG 12 hr tablet; Take 1 tablet (600 mg total) by mouth 2 (two) times daily as needed for cough or to loosen phlegm.  Dispense: 30 tablet; Refill: 0   Follow up plan: Return if symptoms worsen or fail to  improve.  This visit was completed via telephone due to the restrictions of the COVID-19 pandemic. All issues as above were discussed and addressed but no physical exam was performed. If it was felt that the patient should be evaluated in the office, they were directed there. The patient verbally consented to this visit. Patient was unable to complete an audio/visual visit due to Lack of equipment. Location of the patient: home Location of the provider: work Those involved with this call:  Provider: Cathlean Marseilles, DNP, FNP-C CMA: Moises Blood, CMA Front Desk/Registration: Percival Spanish  Time spent on call:  13 minutes on the phone discussing health concerns. 15 minutes  total spent in review of patient's record and preparation of their chart. I verified patient identity using two factors (patient name and date of birth). Patient consents verbally to being seen via telemedicine visit today.

## 2021-05-19 ENCOUNTER — Telehealth: Payer: Self-pay | Admitting: *Deleted

## 2021-05-19 NOTE — Chronic Care Management (AMB) (Signed)
  Chronic Care Management   Note  05/19/2021 Name: Christine Hogan MRN: [id number] DOB: 12/12/1945  Christine Hogan is a 75 y.o. year old female who is a primary care patient of Dennard Schaumann, Cammie Mcgee, MD. I reached out to [name] by phone today in response to a referral sent by Ms. Dion Body PCP, Dr. Dennard Schaumann      Ms. Zupko was given information about Chronic Care Management services today including:  CCM service includes personalized support from designated clinical staff supervised by her physician, including individualized plan of care and coordination with other care providers 24/7 contact phone numbers for assistance for urgent and routine care needs. Service will only be billed when office clinical staff spend 20 minutes or more in a month to coordinate care. Only one practitioner may furnish and bill the service in a calendar month. The patient may stop CCM services at any time (effective at the end of the month) by phone call to the office staff. The patient will be responsible for cost sharing (co-pay) of up to 20% of the service fee (after annual deductible is met).  Patient agreed to services and verbal consent obtained.   Follow up plan: Telephone appointment with care management team member scheduled for:06/03/2021  [place] [facility]

## 2021-05-27 ENCOUNTER — Ambulatory Visit: Payer: Medicare Other | Admitting: Nurse Practitioner

## 2021-06-03 ENCOUNTER — Telehealth: Payer: Self-pay | Admitting: *Deleted

## 2021-06-03 ENCOUNTER — Telehealth: Payer: Medicare Other

## 2021-06-03 NOTE — Telephone Encounter (Signed)
  Care Management   Follow Up Note   06/03/2021 Name: DEANNA WIATER MRN: [id number] DOB: [birth year]   Referred by: Donita Brooks, MD Reason for referral : Chronic Care Management (HTN, PAF)   An unsuccessful telephone outreach was attempted today. The patient was referred to the case management team for assistance with care management and care coordination.   Follow Up Plan: Telephone follow up appointment with care management team member scheduled for:  upon care guide rescheduling.  Irving Shows RNC, BSN RN Case Manager [facility] [phone]

## 2021-07-01 ENCOUNTER — Ambulatory Visit (INDEPENDENT_AMBULATORY_CARE_PROVIDER_SITE_OTHER): Payer: Medicare Other | Admitting: *Deleted

## 2021-07-01 DIAGNOSIS — I480 Paroxysmal atrial fibrillation: Secondary | ICD-10-CM | POA: Diagnosis not present

## 2021-07-01 DIAGNOSIS — I10 Essential (primary) hypertension: Secondary | ICD-10-CM | POA: Diagnosis not present

## 2021-07-01 NOTE — Chronic Care Management (AMB) (Signed)
Chronic Care Management   CCM RN Visit Note  07/01/2021 Name: Christine Hogan MRN: [id number] DOB: [birth year]  Subjective: Christine Hogan is a 75 y.o. year old female who is a primary care patient of Pickard, Cammie Mcgee, MD. The care management team was consulted for assistance with disease management and care coordination needs.    Engaged with patient by telephone for initial visit in response to provider referral for case management and/or care coordination services.   Consent to Services:  The patient was given the following information about Chronic Care Management services today, agreed to services, and gave verbal consent: 1. CCM service includes personalized support from designated clinical staff supervised by the primary care provider, including individualized plan of care and coordination with other care providers 2. 24/7 contact phone numbers for assistance for urgent and routine care needs. 3. Service will only be billed when office clinical staff spend 20 minutes or more in a month to coordinate care. 4. Only one practitioner may furnish and bill the service in a calendar month. 5.The patient may stop CCM services at any time (effective at the end of the month) by phone call to the office staff. 6. The patient will be responsible for cost sharing (co-pay) of up to 20% of the service fee (after annual deductible is met). Patient agreed to services and consent obtained.  Patient agreed to services and verbal consent obtained.   Assessment: Review of patient past medical history, allergies, medications, health status, including review of consultants reports, laboratory and other test data, was performed as part of comprehensive evaluation and provision of chronic care management services.   SDOH (Social Determinants of Health) assessments and interventions performed:  SDOH Interventions    Flowsheet Row Most Recent Value  SDOH Interventions   Food Insecurity Interventions Intervention Not  Indicated  Transportation Interventions Intervention Not Indicated        CCM Care Plan  Allergies  Allergen Reactions   Codeine Nausea And Vomiting and Other (See Comments)    Dizziness, also    Outpatient Encounter Medications as of 07/01/2021  Medication Sig Note   amLODipine (NORVASC) 10 MG tablet TAKE 1 TABLET BY MOUTH  DAILY    ELIQUIS 5 MG TABS tablet TAKE 1 TABLET BY MOUTH  TWICE DAILY    hydrochlorothiazide (HYDRODIURIL) 12.5 MG tablet Take 1 tablet (12.5 mg total) by mouth daily.    LORazepam (ATIVAN) 1 MG tablet Take 1 tablet (1 mg total) by mouth 3 (three) times daily as needed for anxiety.    losartan (COZAAR) 100 MG tablet Take 1 tablet (100 mg total) by mouth daily.    metoprolol tartrate (LOPRESSOR) 50 MG tablet Take 1 tablet (50 mg total) by mouth 2 (two) times daily. 09/12/2020: The patient remarked she makes sure she always gets 1 dose in per day- sometimes forgets her 2nd dose   Multiple Vitamins-Minerals (CENTRUM SILVER 50+WOMEN) TABS Take 1 tablet by mouth daily with breakfast.    rosuvastatin (CRESTOR) 10 MG tablet TAKE 1 TABLET BY MOUTH ONCE DAILY    guaiFENesin (MUCINEX) 600 MG 12 hr tablet Take 1 tablet (600 mg total) by mouth 2 (two) times daily as needed for cough or to loosen phlegm. (Patient not taking: Reported on 07/01/2021)    methocarbamol (ROBAXIN) 500 MG tablet Take 1 tablet (500 mg total) by mouth 3 (three) times daily. (Patient not taking: No sig reported)    nitroGLYCERIN (NITROSTAT) 0.4 MG SL tablet Place 1 tablet (0.4 mg total)  under the tongue every 5 (five) minutes as needed for chest pain. (Patient not taking: Reported on 07/01/2021) 09/12/2020: Provider: The patient's medication has EXPIRED- will need a new Rx if she is to continue carrying this   ondansetron (ZOFRAN-ODT) 4 MG disintegrating tablet Take 1 tablet (4 mg total) by mouth every 8 (eight) hours as needed for nausea or vomiting. (Patient not taking: Reported on 07/01/2021)    pantoprazole  (PROTONIX) 40 MG tablet TAKE 1 TABLET BY MOUTH  DAILY (Patient not taking: Reported on 07/01/2021)    tiZANidine (ZANAFLEX) 4 MG tablet Take 1 tablet (4 mg total) by mouth every 6 (six) hours as needed for muscle spasms. (Patient not taking: Reported on 07/01/2021)    No facility-administered encounter medications on file as of 07/01/2021.    Patient Active Problem List   Diagnosis Date Noted   CKD (chronic kidney disease) stage 3, GFR 30-59 ml/min (HCC)    Abnormal finding on MRI of brain 09/15/2019   Alteration of consciousness 08/26/2019   Syncope 05/26/2019   Nonrheumatic mitral (valve) insufficiency 10/08/2018   Vasovagal syncope 10/08/2018   Osteopenia    Diastolic dysfunction    Paroxysmal atrial fibrillation ([place]) 02/09/2017   Chronic renal insufficiency 06/28/2013   Chest pain 06/27/2013   Essential hypertension    Mixed hyperlipidemia     Conditions to be addressed/monitored:Atrial Fibrillation and HTN  Care Plan : Atrial Fibrillation (Adult)  Updates made by Kassie Mends, RN since 07/01/2021 12:00 AM     Problem: Dysrhythmia (Atrial Fibrillation)   Priority: Medium     Long-Range Goal: Heart Rate and Rhythm Monitored and Managed   Start Date: 07/01/2021  Expected End Date: 01/01/2022  This Visit's Progress: On track  Priority: Medium  Note:   Current Barriers:  Knowledge deficits related to self health management of chronic afib- needs reinforcement for action plan / management atrial fibrillation. Pt reports her adult son lives there with her but pt states she is overall independent with all aspects of her care, she continues to drive, gets out to church and visits friends.    Chronic Disease Management support and education needs related to afib No Advanced Directives in place- pt requests documents be mailed Does not adhere to provider recommendations re:  does not check heart rate, reports she will start checking.  Pt does not exercise and does not adhere to a  special diet. Nurse Case Manager Clinical Goal(s):  patient will take all medications exactly as prescribed and will call provider for medication related questions patient will verbalize understanding of Afib Action Plan and when to call doctor patient will verbalize understanding of plan for afib the patient will work with the care management team towards completion of advanced directives Interventions:  Collaboration with Susy Frizzle, MD regarding development and update of comprehensive plan of care as evidenced by provider attestation and co-signature Inter-disciplinary care team collaboration (see longitudinal plan of care) Basic overview and discussion of afib disease state Afib action plan reviewed and importance of contacting doctor for any concerns or questions Evaluation of current treatment plan related to afib and patient's adherence to plan as established by provider. Provided education to patient and/or caregiver about advanced directives Reviewed medications with patient and discussed importance of taking as prescribed Encouraged pt to exercise Mailed advanced directives packet and education to patient's home Reviewed scheduled/upcoming provider appointments including: annual wellness visit 03/09/2022 Self-Care Activities: Calls provider office for new concerns or questions Patient Goals: - begin a  symptom diary - check pulse (heart) rate once a day - make a plan to exercise regularly - make a plan to eat heart healthy diet - take medicine as prescribed  - contact your doctor for any issues with heart rate - please look over advanced directives packet and education mailed to you and complete when ready, call RN care manager if you have any questions at [phone] Follow Up Plan: Telephone follow up appointment with care management team member scheduled for:   08/15/2021    Care Plan : Hypertension (Adult)  Updates made by Kassie Mends, RN since 07/01/2021 12:00 AM      Problem: Hypertension (Hypertension)   Priority: Medium     Long-Range Goal: Hypertension Monitored   Start Date: 07/01/2021  Expected End Date: 01/01/2022  This Visit's Progress: On track  Priority: Medium  Note:   Objective:  Last practice recorded BP readings:  BP Readings from Last 3 Encounters:  11/02/20 140/90  10/08/20 120/68  09/21/20 (!) 150/80   Most recent eGFR/CrCl: No results found for: EGFR  No components found for: CRCL Current Barriers:  Knowledge Deficits related to basic understanding of hypertension pathophysiology and self care management- pt independent with all aspects of her care, has a cane "just in case".  Reports taking all medications as prescribed. Reports sometimes she has difficulty sleeping and takes melatonin at times, does not take day time naps, does not exercise. Does not adhere to provider recommendations re:   does not adhere to any special diet Case Manager Clinical Goal(s):  patient will verbalize understanding of plan for hypertension management patient will attend all scheduled medical appointments patient will demonstrate improved adherence to prescribed treatment plan for hypertension as evidenced by taking all medications as prescribed, monitoring and recording blood pressure as directed, adhering to low sodium/DASH diet Interventions:  Collaboration with Susy Frizzle, MD regarding development and update of comprehensive plan of care as evidenced by provider attestation and co-signature Inter-disciplinary care team collaboration (see longitudinal plan of care) Evaluation of current treatment plan related to hypertension self management and patient's adherence to plan as established by provider. Provided education to patient re: stroke prevention, s/s of heart attack and stroke, DASH diet, complications of uncontrolled blood pressure Reviewed medications with patient and discussed importance of compliance Discussed plans with patient  for ongoing care management follow up and provided patient with direct contact information for care management team Advised patient, providing education and rationale, to monitor blood pressure 3 x week and record, calling PCP for findings outside established parameters.  Reviewed scheduled/upcoming provider appointments including:  03/09/2022 annual wellness visit Mailed education to pt- low sodium diet Reviewed importance of exercise and getting outside daily Reviewed good sleep hygiene strategies Self-Care Activities: Attends all scheduled provider appointments Calls provider office for new concerns, questions, or BP outside discussed parameters Checks BP and records as discussed Follows a low sodium diet/DASH diet Patient Goals: - check blood pressure 3 times per week - choose a place to take my blood pressure (home, clinic or office, retail store) - write blood pressure results in a log or diary  - follow low sodium diet- avoid salty snacks and fast food - look over education mailed to you- low sodium diet - try to get outside everyday and do some type of exercise- walking is good - try a warm bath before bedtime and turn the TV off in the bedroom when trying to sleep - keep stress to a minimum Follow Up  Plan: Telephone follow up appointment with care management team member scheduled for:   08/15/2021     Plan:Telephone follow up appointment with care management team member scheduled for:  08/15/2021  [facility], BSN RN Case Manager [facility] [phone]

## 2021-07-01 NOTE — Patient Instructions (Signed)
Visit Information   PATIENT GOALS:     Consent to CCM Services: Christine Hogan was given information about Chronic Care Management services including:  CCM service includes personalized support from designated clinical staff supervised by her physician, including individualized plan of care and coordination with other care providers 24/7 contact phone numbers for assistance for urgent and routine care needs. Service will only be billed when office clinical staff spend 20 minutes or more in a month to coordinate care. Only one practitioner may furnish and bill the service in a calendar month. The patient may stop CCM services at any time (effective at the end of the month) by phone call to the office staff. The patient will be responsible for cost sharing (co-pay) of up to 20% of the service fee (after annual deductible is met).  Patient agreed to services and verbal consent obtained.   The patient verbalized understanding of instructions, educational materials, and care plan provided today and agreed to receive a mailed copy of patient instructions, educational materials, and care plan.   Telephone follow up appointment with care management team member scheduled for:  10/10/2022Critical care medicine: Principles of diagnosis and management in the adult (4th ed., pp. 3500-9381). Saunders."> Miller's anesthesia (8th ed., pp. [phone]). Saunders.">  Advance Directive  Advance directives are legal documents that allow you to make decisions about your health care and medical treatment in case you become unable to communicate for yourself. Advance directives let your wishes be known to family, friends,and health care providers. Discussing and writing advance directives should happen over time rather than all at once. Advance directives can be changed and updated at any time. There are different types of advance directives, such as: Medical power of attorney. Living will. Do not resuscitate (DNR) order  or do not attempt resuscitation (DNAR) order. Health care proxy and medical power of attorney A health care proxy is also called a health care agent. This person is appointed to make medical decisions for you when you are unable to make decisions for yourself. Generally, people ask a trusted friend or family member to act as their proxy and represent their preferences. Make sure you have an agreement with your trusted person to act as your proxy. A proxy may have tomake a medical decision on your behalf if your wishes are not known. A medical power of attorney, also called a durable power of attorney for health care, is a legal document that names your health care proxy. Depending on the laws in your state, the document may need to be: Signed. Notarized. Dated. Copied. Witnessed. Incorporated into your medical record. You may also want to appoint a trusted person to manage your money in the event you are unable to do so. This is called a durable power of attorney for finances. It is a separate legal document from the durable power of attorney for health care. You may choose your health care proxy or someone different toact as your agent in money matters. If you do not appoint a proxy, or there is a concern that the proxy is not acting in your best interest, a court may appoint a guardian to act on yourbehalf. Living will A living will is a set of instructions that state your wishes about medical care when you cannot express them yourself. Health care providers should keep a copy of your living will in your medical record. You may want to give a copy to family members or friends. To alert caregivers in case of  an emergency, you can place a card in your wallet to let them know that you have a living will and where they can find it. A living will is used if you become: Terminally ill. Disabled. Unable to communicate or make decisions. The following decisions should be included in your living will: To  use or not to use life support equipment, such as dialysis machines and breathing machines (ventilators). Whether you want a DNR or DNAR order. This tells health care providers not to use cardiopulmonary resuscitation (CPR) if breathing or heartbeat stops. To use or not to use tube feeding. To be given or not to be given food and fluids. Whether you want comfort (palliative) care when the goal becomes comfort rather than a cure. Whether you want to donate your organs and tissues. A living will does not give instructions for distributing your money andproperty if you should pass away. DNR or DNAR A DNR or DNAR order is a request not to have CPR in the event that your heart stops beating or you stop breathing. If a DNR or DNAR order has not been made and shared, a health care provider will try to help any patient whose heart has stopped or who has stopped breathing. If you plan to have surgery, talk with your health care provider about how your DNR or DNAR order will be followed ifproblems occur. What if I do not have an advance directive? Some states assign family decision makers to act on your behalf if you do not have an advance directive. Each state has its own laws about advance directives. You may want to check with your health care provider, attorney, orstate representative about the laws in your state. Summary Advance directives are legal documents that allow you to make decisions about your health care and medical treatment in case you become unable to communicate for yourself. The process of discussing and writing advance directives should happen over time. You can change and update advance directives at any time. Advance directives may include a medical power of attorney, a living will, and a DNR or DNAR order. This information is not intended to replace advice given to you by your health care provider. Make sure you discuss any questions you have with your healthcare provider. Document  Revised: 07/27/2020 Document Reviewed: 07/27/2020 Elsevier Patient Education  [employer]. Low-Sodium Eating Plan Sodium, which is an element that makes up salt, helps you maintain a healthy balance of fluids in your body. Too much sodium can increase your bloodpressure and cause fluid and waste to be held in your body. Your health care provider or dietitian may recommend following this plan if you have high blood pressure (hypertension), kidney disease, liver disease, or heart failure. Eating less sodium can help lower your blood pressure, reduce swelling, and protect your heart, liver, andkidneys. What are tips for following this plan? Reading food labels The Nutrition Facts label lists the amount of sodium in one serving of the food. If you eat more than one serving, you must multiply the listed amount of sodium by the number of servings. Choose foods with less than 140 mg of sodium per serving. Avoid foods with 300 mg of sodium or more per serving. Shopping  Look for lower-sodium products, often labeled as "low-sodium" or "no salt added." Always check the sodium content, even if foods are labeled as "unsalted" or "no salt added." Buy fresh foods. Avoid canned foods and pre-made or frozen meals. Avoid canned, cured, or processed meats.  Buy breads that have less than 80 mg of sodium per slice.  Cooking  Eat more home-cooked food and less restaurant, buffet, and fast food. Avoid adding salt when cooking. Use salt-free seasonings or herbs instead of table salt or sea salt. Check with your health care provider or pharmacist before using salt substitutes. Cook with plant-based oils, such as canola, sunflower, or olive oil.  Meal planning When eating at a restaurant, ask that your food be prepared with less salt or no salt, if possible. Avoid dishes labeled as brined, pickled, cured, smoked, or made with soy sauce, miso, or teriyaki sauce. Avoid foods that contain MSG (monosodium  glutamate). MSG is sometimes added to [place] food, bouillon, and some canned foods. Make meals that can be grilled, baked, poached, roasted, or steamed. These are generally made with less sodium. General information Most people on this plan should limit their sodium intake to 1,500-2,000 mg (milligrams) of sodium each day. What foods should I eat? Fruits Fresh, frozen, or canned fruit. Fruit juice. Vegetables Fresh or frozen vegetables. "No salt added" canned vegetables. "No salt added"tomato sauce and paste. Low-sodium or reduced-sodium tomato and vegetable juice. Grains Low-sodium cereals, including oats, puffed wheat and rice, and shredded wheat. Low-sodium crackers. Unsalted rice. Unsalted pasta. Low-sodium bread.Whole-grain breads and whole-grain pasta. Meats and other proteins Fresh or frozen (no salt added) meat, poultry, seafood, and fish. Low-sodium canned tuna and salmon. Unsalted nuts. Dried peas, beans, and lentils withoutadded salt. Unsalted canned beans. Eggs. Unsalted nut butters. Dairy Milk. Soy milk. Cheese that is naturally low in sodium, such as ricotta cheese, fresh mozzarella, or Swiss cheese. Low-sodium or reduced-sodium cheese. Creamcheese. Yogurt. Seasonings and condiments Fresh and dried herbs and spices. Salt-free seasonings. Low-sodium mustard and ketchup. Sodium-free salad dressing. Sodium-free light mayonnaise. Fresh orrefrigerated horseradish. Lemon juice. Vinegar. Other foods Homemade, reduced-sodium, or low-sodium soups. Unsalted popcorn and pretzels.Low-salt or salt-free chips. The items listed above may not be a complete list of foods and beverages you can eat. Contact a dietitian for more information. What foods should I avoid? Vegetables Sauerkraut, pickled vegetables, and relishes. Olives. [place] fries. Onion rings. Regular canned vegetables (not low-sodium or reduced-sodium). Regular canned tomato sauce and paste (not low-sodium or reduced-sodium). Regular  tomato and vegetable juice (not low-sodium or reduced-sodium). Frozenvegetables in sauces. Grains Instant hot cereals. Bread stuffing, pancake, and biscuit mixes. Croutons. Seasoned rice or pasta mixes. Noodle soup cups. Boxed or frozen macaroni andcheese. Regular salted crackers. Self-rising flour. Meats and other proteins Meat or fish that is salted, canned, smoked, spiced, or pickled. Precooked or cured meat, such as sausages or meat loaves. Berniece Salines. Ham. Pepperoni. Hot dogs. Corned beef. Chipped beef. Salt pork. Jerky. Pickled herring. Anchovies andsardines. Regular canned tuna. Salted nuts. Dairy Processed cheese and cheese spreads. Hard cheeses. Cheese curds. Blue cheese.Feta cheese. String cheese. Regular cottage cheese. Buttermilk. Canned milk. Fats and oils Salted butter. Regular margarine. Ghee. Bacon fat. Seasonings and condiments Onion salt, garlic salt, seasoned salt, table salt, and sea salt. Canned and packaged gravies. Worcestershire sauce. Tartar sauce. Barbecue sauce. Teriyaki sauce. Soy sauce, including reduced-sodium. Steak sauce. Fish sauce. Oyster sauce. Cocktail sauce. Horseradish that you find on the shelf. Regular ketchup and mustard. Meat flavorings and tenderizers. Bouillon cubes. Hot sauce. Pre-made or packaged marinades. Pre-made or packaged taco seasonings. Relishes.Regular salad dressings. Salsa. Other foods Salted popcorn and pretzels. Corn chips and puffs. Potato and tortilla chips.Canned or dried soups. Pizza. Frozen entrees and pot pies. The items listed above may not  be a complete list of foods and beverages you should avoid. Contact a dietitian for more information. Summary Eating less sodium can help lower your blood pressure, reduce swelling, and protect your heart, liver, and kidneys. Most people on this plan should limit their sodium intake to 1,500-2,000 mg (milligrams) of sodium each day. Canned, boxed, and frozen foods are high in sodium. Restaurant foods,  fast foods, and pizza are also very high in sodium. You also get sodium by adding salt to food. Try to cook at home, eat more fresh fruits and vegetables, and eat less fast food and canned, processed, or prepared foods. This information is not intended to replace advice given to you by your health care provider. Make sure you discuss any questions you have with your healthcare provider. Document Revised: 11/28/2019 Document Reviewed: 09/24/2019 Elsevier Patient Education  2022 Christine Hogan, [facility] [phone]   CLINICAL CARE PLAN: Patient Care Plan: Atrial Fibrillation (Adult)     Problem Identified: Dysrhythmia (Atrial Fibrillation)   Priority: Medium     Long-Range Goal: Heart Rate and Rhythm Monitored and Managed   Start Date: 07/01/2021  Expected End Date: 01/01/2022  This Visit's Progress: On track  Priority: Medium  Note:   Current Barriers:  Knowledge deficits related to self health management of chronic afib- needs reinforcement for action plan / management atrial fibrillation. Pt reports her adult son lives there with her but pt states she is overall independent with all aspects of her care, she continues to drive, gets out to church and visits friends.    Chronic Disease Management support and education needs related to afib No Advanced Directives in place- pt requests documents be mailed Does not adhere to provider recommendations re:  does not check heart rate, reports she will start checking.  Pt does not exercise and does not adhere to a special diet. Nurse Case Manager Clinical Goal(s):  patient will take all medications exactly as prescribed and will call provider for medication related questions patient will verbalize understanding of Afib Action Plan and when to call doctor patient will verbalize understanding of plan for afib the patient will work with the care management team towards completion of  advanced directives Interventions:  Collaboration with Susy Frizzle, MD regarding development and update of comprehensive plan of care as evidenced by provider attestation and co-signature Inter-disciplinary care team collaboration (see longitudinal plan of care) Basic overview and discussion of afib disease state Afib action plan reviewed and importance of contacting doctor for any concerns or questions Evaluation of current treatment plan related to afib and patient's adherence to plan as established by provider. Provided education to patient and/or caregiver about advanced directives Reviewed medications with patient and discussed importance of taking as prescribed Encouraged pt to exercise Mailed advanced directives packet and education to patient's home Reviewed scheduled/upcoming provider appointments including: annual wellness visit 03/09/2022 Self-Care Activities: Calls provider office for new concerns or questions Patient Goals: - begin a symptom diary - check pulse (heart) rate once a day - make a plan to exercise regularly - make a plan to eat heart healthy diet - take medicine as prescribed  - contact your doctor for any issues with heart rate - please look over advanced directives packet and education mailed to you and complete when ready, call RN care manager if you have any questions at [phone] Follow Up Plan: Telephone follow up appointment with care management team member scheduled  for:   08/15/2021    Patient Care Plan: Hypertension (Adult)     Problem Identified: Hypertension (Hypertension)   Priority: Medium     Long-Range Goal: Hypertension Monitored   Start Date: 07/01/2021  Expected End Date: 01/01/2022  This Visit's Progress: On track  Priority: Medium  Note:   Objective:  Last practice recorded BP readings:  BP Readings from Last 3 Encounters:  11/02/20 140/90  10/08/20 120/68  09/21/20 (!) 150/80   Most recent eGFR/CrCl: No results found  for: EGFR  No components found for: CRCL Current Barriers:  Knowledge Deficits related to basic understanding of hypertension pathophysiology and self care management- pt independent with all aspects of her care, has a cane "just in case".  Reports taking all medications as prescribed. Reports sometimes she has difficulty sleeping and takes melatonin at times, does not take day time naps, does not exercise. Does not adhere to provider recommendations re:   does not adhere to any special diet Case Manager Clinical Goal(s):  patient will verbalize understanding of plan for hypertension management patient will attend all scheduled medical appointments patient will demonstrate improved adherence to prescribed treatment plan for hypertension as evidenced by taking all medications as prescribed, monitoring and recording blood pressure as directed, adhering to low sodium/DASH diet Interventions:  Collaboration with Susy Frizzle, MD regarding development and update of comprehensive plan of care as evidenced by provider attestation and co-signature Inter-disciplinary care team collaboration (see longitudinal plan of care) Evaluation of current treatment plan related to hypertension self management and patient's adherence to plan as established by provider. Provided education to patient re: stroke prevention, s/s of heart attack and stroke, DASH diet, complications of uncontrolled blood pressure Reviewed medications with patient and discussed importance of compliance Discussed plans with patient for ongoing care management follow up and provided patient with direct contact information for care management team Advised patient, providing education and rationale, to monitor blood pressure 3 x week and record, calling PCP for findings outside established parameters.  Reviewed scheduled/upcoming provider appointments including:  03/09/2022 annual wellness visit Mailed education to pt- low sodium diet Reviewed  importance of exercise and getting outside daily Reviewed good sleep hygiene strategies Self-Care Activities: Attends all scheduled provider appointments Calls provider office for new concerns, questions, or BP outside discussed parameters Checks BP and records as discussed Follows a low sodium diet/DASH diet Patient Goals: - check blood pressure 3 times per week - choose a place to take my blood pressure (home, clinic or office, retail store) - write blood pressure results in a log or diary  - follow low sodium diet- avoid salty snacks and fast food - look over education mailed to you- low sodium diet - try to get outside everyday and do some type of exercise- walking is good - try a warm bath before bedtime and turn the TV off in the bedroom when trying to sleep - keep stress to a minimum Follow Up Plan: Telephone follow up appointment with care management team member scheduled for:   08/15/2021

## 2021-08-02 ENCOUNTER — Other Ambulatory Visit: Payer: Self-pay | Admitting: Cardiovascular Disease

## 2021-08-04 ENCOUNTER — Other Ambulatory Visit: Payer: Self-pay

## 2021-08-04 ENCOUNTER — Other Ambulatory Visit: Payer: Self-pay | Admitting: Family Medicine

## 2021-08-04 ENCOUNTER — Ambulatory Visit
Admission: RE | Admit: 2021-08-04 | Discharge: 2021-08-04 | Disposition: A | Discharge status: Home / Self Care | Payer: Medicare Other | Source: Ambulatory Visit | Attending: Family Medicine | Admitting: Family Medicine

## 2021-08-04 DIAGNOSIS — Z1231 Encounter for screening mammogram for malignant neoplasm of breast: Secondary | ICD-10-CM | POA: Diagnosis not present

## 2021-08-15 ENCOUNTER — Ambulatory Visit (INDEPENDENT_AMBULATORY_CARE_PROVIDER_SITE_OTHER): Payer: Medicare Other | Admitting: *Deleted

## 2021-08-15 DIAGNOSIS — I480 Paroxysmal atrial fibrillation: Secondary | ICD-10-CM

## 2021-08-15 DIAGNOSIS — I10 Essential (primary) hypertension: Secondary | ICD-10-CM

## 2021-08-15 NOTE — Patient Instructions (Signed)
Visit Information  PATIENT GOALS:  Goals Addressed             This Visit's Progress    Track and Manage Heart Rate and Rhythm-Atrial Fibrillation       Timeframe:  Long-Range Goal Priority:  Medium Start Date:      07/01/2021                       Expected End Date:     01/01/2022                  Follow Up Date 10/24/2021   - begin a symptom diary - check pulse (heart) rate once a day - try to exercise regularly and get outside daily - follow heart healthy diet - take medicine as prescribed  - contact your doctor for any issues with heart rate - call RN care manager if you have any questions at [phone] about completing advanced directives packet   Why is this important?   Atrial fibrillation may have no symptoms. Sometimes the symptoms get worse or happen more often.  It is important to keep track of what your symptoms are and when they happen.  A change in symptoms is important to discuss with your doctor or nurse.  Being active and healthy eating will also help you manage your heart condition.     Notes:      Track and Manage My Blood Pressure-Hypertension       Timeframe:  Long-Range Goal Priority:  Medium Start Date:       07/01/2021                      Expected End Date:      01/01/2022                 Follow Up Date 10/24/2021   - check blood pressure 3 times per week and record in a log - take blood pressure log to doctor's appointments - follow low sodium diet- read labels, avoid salty snacks and fast food - try to get outside everyday and do some type of exercise- walking is good - try a warm bath before bedtime and turn the TV off in the bedroom when trying to sleep - keep stress under control   Why is this important?   You won't feel high blood pressure, but it can still hurt your blood vessels.  High blood pressure can cause heart or kidney problems. It can also cause a stroke.  Making lifestyle changes like losing a little weight or eating less  salt will help.  Checking your blood pressure at home and at different times of the day can help to control blood pressure.  If the doctor prescribes medicine remember to take it the way the doctor ordered.  Call the office if you cannot afford the medicine or if there are questions about it.     Notes:         The patient verbalized understanding of instructions, educational materials, and care plan provided today and declined offer to receive copy of patient instructions, educational materials, and care plan.   Telephone follow up appointment with care management team member scheduled for:  10/24/2021  Irving Shows [facility], BSN RN Case Manager [facility] [phone]

## 2021-08-15 NOTE — Chronic Care Management (AMB) (Addendum)
Chronic Care Management   CCM RN Visit Note  08/15/2021 Name: Christine Hogan MRN: [id number] DOB: Feb 01, 1946  Subjective: Christine Hogan is a 75 y.o. year old female who is a primary care patient of Pickard, Cammie Mcgee, MD. The care management team was consulted for assistance with disease management and care coordination needs.    Engaged with patient by telephone for follow up visit in response to provider referral for case management and/or care coordination services.   Consent to Services:  The patient was given information about Chronic Care Management services, agreed to services, and gave verbal consent prior to initiation of services.  Please see initial visit note for detailed documentation.   Patient agreed to services and verbal consent obtained.   Assessment: Review of patient past medical history, allergies, medications, health status, including review of consultants reports, laboratory and other test data, was performed as part of comprehensive evaluation and provision of chronic care management services.   SDOH (Social Determinants of Health) assessments and interventions performed:    CCM Care Plan  Allergies  Allergen Reactions   Codeine Nausea And Vomiting and Other (See Comments)    Dizziness, also    Outpatient Encounter Medications as of 08/15/2021  Medication Sig Note   amLODipine (NORVASC) 10 MG tablet TAKE 1 TABLET BY MOUTH  DAILY    ELIQUIS 5 MG TABS tablet TAKE 1 TABLET BY MOUTH  TWICE DAILY    hydrochlorothiazide (HYDRODIURIL) 12.5 MG tablet TAKE 1 TABLET BY MOUTH EVERY DAY    LORazepam (ATIVAN) 1 MG tablet Take 1 tablet (1 mg total) by mouth 3 (three) times daily as needed for anxiety.    losartan (COZAAR) 100 MG tablet Take 1 tablet (100 mg total) by mouth daily.    metoprolol tartrate (LOPRESSOR) 50 MG tablet TAKE 1 TABLET BY MOUTH TWICE A DAY    Multiple Vitamins-Minerals (CENTRUM SILVER 50+WOMEN) TABS Take 1 tablet by mouth daily with breakfast.     pantoprazole (PROTONIX) 40 MG tablet TAKE 1 TABLET BY MOUTH  DAILY    rosuvastatin (CRESTOR) 10 MG tablet TAKE 1 TABLET BY MOUTH ONCE DAILY    guaiFENesin (MUCINEX) 600 MG 12 hr tablet Take 1 tablet (600 mg total) by mouth 2 (two) times daily as needed for cough or to loosen phlegm. (Patient not taking: No sig reported)    methocarbamol (ROBAXIN) 500 MG tablet Take 1 tablet (500 mg total) by mouth 3 (three) times daily. (Patient not taking: No sig reported)    nitroGLYCERIN (NITROSTAT) 0.4 MG SL tablet Place 1 tablet (0.4 mg total) under the tongue every 5 (five) minutes as needed for chest pain. (Patient not taking: No sig reported) 09/12/2020: Provider: The patient's medication has EXPIRED- will need a new Rx if she is to continue carrying this   ondansetron (ZOFRAN-ODT) 4 MG disintegrating tablet Take 1 tablet (4 mg total) by mouth every 8 (eight) hours as needed for nausea or vomiting. (Patient not taking: No sig reported)    tiZANidine (ZANAFLEX) 4 MG tablet Take 1 tablet (4 mg total) by mouth every 6 (six) hours as needed for muscle spasms. (Patient not taking: No sig reported)    No facility-administered encounter medications on file as of 08/15/2021.    Patient Active Problem List   Diagnosis Date Noted   CKD (chronic kidney disease) stage 3, GFR 30-59 ml/min (HCC)    Abnormal finding on MRI of brain 09/15/2019   Alteration of consciousness 08/26/2019   Syncope 05/26/2019  Nonrheumatic mitral (valve) insufficiency 10/08/2018   Vasovagal syncope 10/08/2018   Osteopenia    Diastolic dysfunction    Paroxysmal atrial fibrillation (HCC) 02/09/2017   Chronic renal insufficiency 06/28/2013   Chest pain 06/27/2013   Essential hypertension    Mixed hyperlipidemia     Conditions to be addressed/monitored:Atrial Fibrillation and HTN  Care Plan : Atrial Fibrillation (Adult)  Updates made by Kassie Mends, RN since 08/15/2021 12:00 AM     Problem: Dysrhythmia (Atrial Fibrillation)    Priority: Medium     Long-Range Goal: Heart Rate and Rhythm Monitored and Managed   Start Date: 07/01/2021  Expected End Date: 01/01/2022  This Visit's Progress: On track  Recent Progress: On track  Priority: Medium  Note:   Current Barriers:  Knowledge deficits related to self health management of chronic afib- needs reinforcement for action plan / management atrial fibrillation. Pt reports her adult son lives there with her but pt states she is overall independent with all aspects of her care, she continues to drive, gets out to church and visits friends.    Chronic Disease Management support and education needs related to afib No Advanced Directives in place- pt reports she received advanced directives packet Does not adhere to provider recommendations re:  Pt checks heart rate at times. Pt does not exercise and does not adhere to a special diet. Nurse Case Manager Clinical Goal(s):  patient will take all medications exactly as prescribed and will call provider for medication related questions patient will verbalize understanding of Afib Action Plan and when to call doctor patient will verbalize understanding of plan for afib the patient will work with the care management team towards completion of advanced directives Interventions:  Collaboration with Susy Frizzle, MD regarding development and update of comprehensive plan of care as evidenced by provider attestation and co-signature Inter-disciplinary care team collaboration (see longitudinal plan of care) Basic overview/ review of afib disease state Afib action plan reinforced and importance of contacting doctor for any concerns or questions Evaluation of current treatment plan related to afib and patient's adherence to plan as established by provider. Reviewed medications with patient and discussed importance of taking as prescribed Encouraged pt to exercise and get outdoors daily Verified patient did receive advanced  directives packet Reviewed scheduled/upcoming provider appointments including: annual wellness visit 03/09/2022 Self-Care Activities: Calls provider office for new concerns or questions Patient Goals: - begin a symptom diary - check pulse (heart) rate once a day - try to exercise regularly and get outside daily - follow heart healthy diet - take medicine as prescribed  - contact your doctor for any issues with heart rate - call RN care manager if you have any questions at [phone] about completing advanced directives packet Follow Up Plan: Telephone follow up appointment with care management team member scheduled for:   10/24/2021    Care Plan : Hypertension (Adult)  Updates made by Kassie Mends, RN since 08/15/2021 12:00 AM     Problem: Hypertension (Hypertension)   Priority: Medium     Long-Range Goal: Hypertension Monitored   Start Date: 07/01/2021  Expected End Date: 01/01/2022  This Visit's Progress: On track  Recent Progress: On track  Priority: Medium  Note:   Objective:  Last practice recorded BP readings:  BP Readings from Last 3 Encounters:  11/02/20 140/90  10/08/20 120/68  09/21/20 (!) 150/80   Most recent eGFR/CrCl: No results found for: EGFR  No components found for: CRCL Current Barriers:  Knowledge  Deficits related to basic understanding of hypertension pathophysiology and self care management- pt independent with all aspects of her care, has a cane "just in case".  Reports taking all medications as prescribed. Reports sometimes she has difficulty sleeping and takes melatonin at times, does not take day time naps, does not exercise. Does not adhere to provider recommendations re:   does not adhere to any special diet Case Manager Clinical Goal(s):  patient will verbalize understanding of plan for hypertension management patient will attend all scheduled medical appointments patient will demonstrate improved adherence to prescribed treatment plan for  hypertension as evidenced by taking all medications as prescribed, monitoring and recording blood pressure as directed, adhering to low sodium/DASH diet Interventions:  Collaboration with Susy Frizzle, MD regarding development and update of comprehensive plan of care as evidenced by provider attestation and co-signature Inter-disciplinary care team collaboration (see longitudinal plan of care) Evaluation of current treatment plan related to hypertension self management and patient's adherence to plan as established by provider. Reinforced education to patient re: stroke prevention, s/s of heart attack and stroke, DASH diet, complications of uncontrolled blood pressure Reviewed medications with patient and discussed importance of compliance Reinforced plans with patient for ongoing care management follow up and provided patient with direct contact information for care management team Reinforced patient, providing education and rationale, to monitor blood pressure 3 x week and record, calling PCP for findings outside established parameters.  Reviewed scheduled/upcoming provider appointments including:  03/09/2022 annual wellness visit Reinforced importance of exercise and getting outside daily Reinforced good sleep hygiene strategies, importance of rest on overall health Self-Care Activities: Attends all scheduled provider appointments Calls provider office for new concerns, questions, or BP outside discussed parameters Checks BP and records as discussed Follows a low sodium diet/DASH diet Patient Goals: - check blood pressure 3 times per week and record in a log - take blood pressure log to doctor's appointments - follow low sodium diet- read labels, avoid salty snacks and fast food - try to get outside everyday and do some type of exercise- walking is good - try a warm bath before bedtime and turn the TV off in the bedroom when trying to sleep - keep stress under control Follow Up Plan:  Telephone follow up appointment with care management team member scheduled for:   10/24/2021     Plan:Telephone follow up appointment with care management team member scheduled for: 10/24/2021  Jacqlyn Larsen [facility], BSN RN Case Manager [facility] [phone]

## 2021-09-05 DIAGNOSIS — I10 Essential (primary) hypertension: Secondary | ICD-10-CM | POA: Diagnosis not present

## 2021-09-05 DIAGNOSIS — I480 Paroxysmal atrial fibrillation: Secondary | ICD-10-CM

## 2021-09-19 ENCOUNTER — Ambulatory Visit (INDEPENDENT_AMBULATORY_CARE_PROVIDER_SITE_OTHER): Payer: Medicare Other | Admitting: Family Medicine

## 2021-09-19 ENCOUNTER — Other Ambulatory Visit: Payer: Self-pay

## 2021-09-19 ENCOUNTER — Encounter: Payer: Self-pay | Admitting: Family Medicine

## 2021-09-19 VITALS — BP 138/68 | HR 66 | Temp 97.3°F | Resp 18 | Ht 65.0 in | Wt 161.0 lb

## 2021-09-19 DIAGNOSIS — R55 Syncope and collapse: Secondary | ICD-10-CM | POA: Diagnosis not present

## 2021-09-19 DIAGNOSIS — R270 Ataxia, unspecified: Secondary | ICD-10-CM | POA: Diagnosis not present

## 2021-09-19 NOTE — Progress Notes (Signed)
Subjective:    Patient ID: Christine Hogan, female    DOB: October 07, 1946, 75 y.o.   MRN: [id number]  HPI Patient states that she was at a football game on Saturday.  She was watching the game all day long.  She was in the sun.  She states that she was not hot.  However, after the football game, going home, she became extremely lightheaded and felt like she was going to pass out.  Her family had to help her into her home to get into her recliner.  When she was in the recliner she "went to sleep immediately".  It sounds like she passed out.  She denies any chest pain.  She denies any shortness of breath.  She denies any palpitations.  However she does report ataxia and having a difficult time standing and walking without assistance.  She denies taking any pain medication or muscle relaxers.  She denies drinking any alcoholic again.  She states that she was staying well-hydrated drinking lemonade.  She denies any fevers or chills.  The next morning she felt better but she went to breakfast with her daughter.  At breakfast, she again became extremely lightheaded.  She states she felt like she had to fall asleep.  She could barely keep her eyes open.  She also feels like she may have been slurring her speech although her daughter did not notice it.  Her daughter had to help her to her car.  She reports feeling unsteady on her feet and staggering.  Once her daughter took her home she went and took a nap.  Today on exam, cranial nerves II through XII are grossly intact with muscle strength 5/5 equal and symmetric in the upper and lower extremities.  She has a normal gait.  Heel-to-toe walk is normal.  Finger-to-nose testing is normal.  Cerebellar exam is normal.  Romberg testing is normal.  There is no neurologic deficit seen on exam.  Cardiovascular she is in regular rate and rhythm with no murmurs rubs or gallops. Past Medical History:  Diagnosis Date   Arthritis    Atrial fibrillation (HCC)    Chest pain    a.  2014: NST with no evidence of ischemia   CKD (chronic kidney disease) stage 3, GFR 30-59 ml/min (HCC)    Diastolic dysfunction    grade 2 with dyspnea on exertion   H/O echocardiogram    a. 2014: echo showing EF of 65-70% with Grade 1 DD, moderate TR, and mild to moderate MR   Hyperlipidemia    Hypertension    Near syncope    Osteopenia    Past Surgical History:  Procedure Laterality Date   ABDOMINAL HYSTERECTOMY     TAH/BSO (menorrhagia)   bunion repair     HERNIA REPAIR     Current Outpatient Medications on File Prior to Visit  Medication Sig Dispense Refill   amLODipine (NORVASC) 10 MG tablet TAKE 1 TABLET BY MOUTH  DAILY 90 tablet 3   ELIQUIS 5 MG TABS tablet TAKE 1 TABLET BY MOUTH  TWICE DAILY 180 tablet 3   hydrochlorothiazide (HYDRODIURIL) 12.5 MG tablet TAKE 1 TABLET BY MOUTH EVERY DAY 90 tablet 0   LORazepam (ATIVAN) 1 MG tablet Take 1 tablet (1 mg total) by mouth 3 (three) times daily as needed for anxiety. 15 tablet 0   losartan (COZAAR) 100 MG tablet Take 1 tablet (100 mg total) by mouth daily. 90 tablet 0   methocarbamol (ROBAXIN) 500 MG tablet  Take 1 tablet (500 mg total) by mouth 3 (three) times daily. 10 tablet 0   metoprolol tartrate (LOPRESSOR) 50 MG tablet TAKE 1 TABLET BY MOUTH TWICE A DAY 180 tablet 0   Multiple Vitamins-Minerals (CENTRUM SILVER 50+WOMEN) TABS Take 1 tablet by mouth daily with breakfast.     nitroGLYCERIN (NITROSTAT) 0.4 MG SL tablet Place 1 tablet (0.4 mg total) under the tongue every 5 (five) minutes as needed for chest pain. 20 tablet 0   ondansetron (ZOFRAN-ODT) 4 MG disintegrating tablet Take 1 tablet (4 mg total) by mouth every 8 (eight) hours as needed for nausea or vomiting. 15 tablet 0   pantoprazole (PROTONIX) 40 MG tablet TAKE 1 TABLET BY MOUTH  DAILY 90 tablet 3   rosuvastatin (CRESTOR) 10 MG tablet TAKE 1 TABLET BY MOUTH ONCE DAILY 90 tablet 3   tiZANidine (ZANAFLEX) 4 MG tablet Take 1 tablet (4 mg total) by mouth every 6 (six) hours  as needed for muscle spasms. 30 tablet 0   No current facility-administered medications on file prior to visit.   Allergies  Allergen Reactions   Codeine Nausea And Vomiting and Other (See Comments)    Dizziness, also   Social History   Socioeconomic History   Marital status: Divorced    Spouse name: Not on file   Number of children: 5   Years of education: 12th grade   Highest education level: Not on file  Occupational History   Occupation: Retired  Tobacco Use   Smoking status: Never   Smokeless tobacco: Never  Vaping Use   Vaping Use: Never used  Substance and Sexual Activity   Alcohol use: Yes   Drug use: No   Sexual activity: Yes  Other Topics Concern   Not on file  Social History Narrative   Lives with her son.   Right-handed.   Four living children.   Occasional caffeine use.   Social Determinants of Health   Financial Resource Strain: Low Risk    Difficulty of Paying Living Expenses: Not hard at all  Food Insecurity: No Food Insecurity   Worried About Programme researcher, broadcasting/film/video in the Last Year: Never true   Ran Out of Food in the Last Year: Never true  Transportation Needs: No Transportation Needs   Lack of Transportation (Medical): No   Lack of Transportation (Non-Medical): No  Physical Activity: Inactive   Days of Exercise per Week: 0 days   Minutes of Exercise per Session: 0 min  Stress: No Stress Concern Present   Feeling of Stress : Not at all  Social Connections: Moderately Isolated   Frequency of Communication with Friends and Family: More than three times a week   Frequency of Social Gatherings with Friends and Family: More than three times a week   Attends Religious Services: More than 4 times per year   Active Member of [employer] or Organizations: No   Attends Banker Meetings: Never   Marital Status: Divorced  Catering manager Violence: Not At Risk   Fear of Current or Ex-Partner: No   Emotionally Abused: No   Physically Abused: No    Sexually Abused: No     Review of Systems  All other systems reviewed and are negative.     Objective:   Physical Exam Vitals reviewed.  Constitutional:      General: She is not in acute distress.    Appearance: Normal appearance. She is normal weight. She is not ill-appearing, toxic-appearing or diaphoretic.  Eyes:  Extraocular Movements: Extraocular movements intact.     Pupils: Pupils are equal, round, and reactive to light.  Neck:     Vascular: No carotid bruit.  Cardiovascular:     Rate and Rhythm: Normal rate and regular rhythm.     Pulses: Normal pulses.     Heart sounds: Normal heart sounds. No murmur heard.   No friction rub. No gallop.  Pulmonary:     Effort: Pulmonary effort is normal. No respiratory distress.     Breath sounds: Normal breath sounds. No stridor. No wheezing, rhonchi or rales.  Abdominal:     General: Abdomen is flat. Bowel sounds are normal.     Palpations: Abdomen is soft.  Neurological:     General: No focal deficit present.     Mental Status: She is alert and oriented to person, place, and time. Mental status is at baseline.     Cranial Nerves: No cranial nerve deficit.     Sensory: No sensory deficit.     Motor: No weakness.     Coordination: Coordination normal.     Gait: Gait normal.     Deep Tendon Reflexes: Reflexes normal.  Psychiatric:        Mood and Affect: Mood normal.        Behavior: Behavior normal.        Thought Content: Thought content normal.        Judgment: Judgment normal.          Assessment & Plan:  Syncope, unspecified syncope type - Plan: CBC with Differential/Platelet, COMPLETE METABOLIC PANEL WITH GFR, Ambulatory referral to Cardiology  Ataxia - Plan: MR Brain W Wo Contrast Patient's history is unclear.  Right now I am concerned that she may have had a vasovagal event due to sitting in the sunlight and becoming dehydrated and hypotensive.  I believe that she likely had a presyncopal/syncopal episode on  Saturday.  The following Sunday morning, I am not sure if the patient had another syncopal episode or near syncope or if she was experiencing ataxia possibly due to a TIA.  Therefore when to check a CBC and a CMP today to evaluate for potential causes of cerebral hypoperfusion such as anemia or dehydration.  I will consult cardiology for an echocardiogram as well as an event monitor to rule out any cardiac arrhythmias.  I will schedule an MRI of the brain to evaluate for any evidence of a stroke that may have caused the ataxia and the slurred speech.  If there is evidence of a stroke on her MRI, I would recommend carotid Dopplers as well.

## 2021-09-20 LAB — CBC WITH DIFFERENTIAL/PLATELET
Absolute Monocytes: 459 cells/uL (ref 200–950)
Basophils Absolute: 22 cells/uL (ref 0–200)
Basophils Relative: 0.4 %
Eosinophils Absolute: 92 cells/uL (ref 15–500)
Eosinophils Relative: 1.7 %
HCT: 39.1 % (ref 35.0–45.0)
Hemoglobin: 12.6 g/dL (ref 11.7–15.5)
Lymphs Abs: 2182 cells/uL (ref 850–3900)
MCH: 28 pg (ref 27.0–33.0)
MCHC: 32.2 g/dL (ref 32.0–36.0)
MCV: 86.9 fL (ref 80.0–100.0)
MPV: 10.6 fL (ref 7.5–12.5)
Monocytes Relative: 8.5 %
Neutro Abs: 2646 cells/uL (ref 1500–7800)
Neutrophils Relative %: 49 %
Platelets: 207 10*3/uL (ref 140–400)
RBC: 4.5 10*6/uL (ref 3.80–5.10)
RDW: 12.8 % (ref 11.0–15.0)
Total Lymphocyte: 40.4 %
WBC: 5.4 10*3/uL (ref 3.8–10.8)

## 2021-09-20 LAB — COMPLETE METABOLIC PANEL WITH GFR
AG Ratio: 1.4 (calc) (ref 1.0–2.5)
ALT: 12 U/L (ref 6–29)
AST: 18 U/L (ref 10–35)
Albumin: 4.6 g/dL (ref 3.6–5.1)
Alkaline phosphatase (APISO): 67 U/L (ref 37–153)
BUN/Creatinine Ratio: 10 (calc) (ref 6–22)
BUN: 17 mg/dL (ref 7–25)
CO2: 27 mmol/L (ref 20–32)
Calcium: 10.3 mg/dL (ref 8.6–10.4)
Chloride: 101 mmol/L (ref 98–110)
Creat: 1.65 mg/dL — ABNORMAL HIGH (ref 0.60–1.00)
Globulin: 3.3 g/dL (calc) (ref 1.9–3.7)
Glucose, Bld: 106 mg/dL — ABNORMAL HIGH (ref 65–99)
Potassium: 3.5 mmol/L (ref 3.5–5.3)
Sodium: 140 mmol/L (ref 135–146)
Total Bilirubin: 0.7 mg/dL (ref 0.2–1.2)
Total Protein: 7.9 g/dL (ref 6.1–8.1)
eGFR: 33 mL/min/{1.73_m2} — ABNORMAL LOW (ref >=60)
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
## 2021-09-23 ENCOUNTER — Ambulatory Visit: Payer: Medicare Other | Admitting: Adult Health

## 2021-09-23 ENCOUNTER — Other Ambulatory Visit: Payer: Self-pay

## 2021-09-23 ENCOUNTER — Telehealth: Payer: Self-pay

## 2021-09-23 ENCOUNTER — Encounter: Payer: Self-pay | Admitting: Radiology

## 2021-09-23 ENCOUNTER — Encounter: Payer: Self-pay | Admitting: Adult Health

## 2021-09-23 VITALS — BP 152/74 | HR 65 | Ht 65.0 in | Wt 157.6 lb

## 2021-09-23 DIAGNOSIS — R55 Syncope and collapse: Secondary | ICD-10-CM | POA: Diagnosis not present

## 2021-09-23 DIAGNOSIS — I480 Paroxysmal atrial fibrillation: Secondary | ICD-10-CM | POA: Diagnosis not present

## 2021-09-23 DIAGNOSIS — R404 Transient alteration of awareness: Secondary | ICD-10-CM

## 2021-09-23 DIAGNOSIS — G4710 Hypersomnia, unspecified: Secondary | ICD-10-CM | POA: Diagnosis not present

## 2021-09-23 LAB — TSH: TSH: 1.77 u[IU]/mL (ref 0.450–4.500)

## 2021-09-23 NOTE — Patient Instructions (Signed)
Medication Instructions:  No changes *If you need a refill on your cardiac medications before your next appointment, please call your pharmacy*   Lab Work:  TSH If you have labs (blood work) drawn today and your tests are completely normal, you will receive your results only by: MyChart Message (if you have MyChart) OR A paper copy in the mail If you have any lab test that is abnormal or we need to change your treatment, we will call you to review the results.   Testing/Procedures:  Will be mailed to you in 5 to 10 days Your physician has recommended that you wear an event monitor 30 days. Event monitors are medical devices that record the heart's electrical activity. Doctors most often [place] these monitors to diagnose arrhythmias. Arrhythmias are problems with the speed or rhythm of the heartbeat. The monitor is a small, portable device. You can wear one while you do your normal daily activities. This is usually used to diagnose what is causing palpitations/syncope (passing out).    Follow-Up: At [facility], you and your health needs are our priority.  As part of our continuing mission to provide you with exceptional heart care, we have created designated Provider Care Teams.  These Care Teams include your primary Cardiologist (physician) and Advanced Practice Providers (APPs -  Physician Assistants and Nurse Practitioners) who all work together to provide you with the care you need, when you need it.  We recommend signing up for the patient portal called "MyChart".  Sign up information is provided on this After Visit Summary.  MyChart is used to connect with patients for Virtual Visits (Telemedicine).  Patients are able to view lab/test results, encounter notes, upcoming appointments, etc.  Non-urgent messages can be sent to your provider as well.   To learn more about what you can do with MyChart, go to [email or web address].    Your next appointment:   2 month(s)  The format for  your next appointment:   In Person  Provider:   Dr Royann Shivers or Lorin Picket DNP    You have been referred to Neurologist  - narcolepsy  Other Instructions  Record your blood pressured daily  And especially if you have feeling of passing out or dizziness    [employer] Cardiac Event Monitor Instructions Your physician has requested you wear your cardiac event monitor for __30___ days, (1-30). [employer] may call or text to confirm a shipping address. The monitor will be sent to a land address via UPS. [employer] will not ship a monitor to a PO BOX. It typically takes 3-5 days to receive your monitor after it has been enrolled. [employer] will assist with USPS tracking if your package is delayed. The telephone number for [employer] is [phone]. Once you have received your monitor, please review the enclosed instructions. Instruction tutorials can also be viewed under help and settings on the enclosed cell phone. Your monitor has already been registered assigning a specific monitor serial # to you.  Applying the monitor Remove cell phone from case and turn it on. The cell phone works as IT consultant and needs to be within [employer] of you at all times. The cell phone will need to be charged on a daily basis. We recommend you plug the cell phone into the enclosed charger at your bedside table every night.  Monitor batteries: You will receive two monitor batteries labelled #1 and #2. These are your recorders. Plug battery #2 onto the second connection on the enclosed charger. Keep one  battery on the charger at all times. This will keep the monitor battery deactivated. It will also keep it fully charged for when you need to switch your monitor batteries. A small light will be blinking on the battery emblem when it is charging. The light on the battery emblem will remain on when the battery is fully charged.  Open package of a Monitor strip. Insert battery #1 into black hood on  strip and gently squeeze monitor battery onto connection as indicated in instruction booklet. Set aside while preparing skin.  Choose location for your strip, vertical or horizontal, as indicated in the instruction booklet. Shave to remove all hair from location. There cannot be any lotions, oils, powders, or colognes on skin where monitor is to be applied. Wipe skin clean with enclosed Saline wipe. Dry skin completely.  Peel paper labeled #1 off the back of the Monitor strip exposing the adhesive. Place the monitor on the chest in the vertical or horizontal position shown in the instruction booklet. One arrow on the monitor strip must be pointing upward. Carefully remove paper labeled #2, attaching remainder of strip to your skin. Try not to create any folds or wrinkles in the strip as you apply it.  Firmly press and release the circle in the center of the monitor battery. You will hear a small beep. This is turning the monitor battery on. The heart emblem on the monitor battery will light up every 5 seconds if the monitor battery in turned on and connected to the patient securely. Do not push and hold the circle down as this turns the monitor battery off. The cell phone will locate the monitor battery. A screen will appear on the cell phone checking the connection of your monitor strip. This may read poor connection initially but change to good connection within the next minute. Once your monitor accepts the connection you will hear a series of 3 beeps followed by a climbing crescendo of beeps. A screen will appear on the cell phone showing the two monitor strip placement options. Touch the picture that demonstrates where you applied the monitor strip.  Your monitor strip and battery are waterproof. You are able to shower, bathe, or swim with the monitor on. They just ask you do not submerge deeper than 3 feet underwater. We recommend removing the monitor if you are swimming in a lake,  river, or ocean.  Your monitor battery will need to be switched to a fully charged monitor battery approximately once a week. The cell phone will alert you of an action which needs to be made.  On the cell phone, tap for details to reveal connection status, monitor battery status, and cell phone battery status. The green dots indicates your monitor is in good status. A red dot indicates there is something that needs your attention.  To record a symptom, click the circle on the monitor battery. In 30-60 seconds a list of symptoms will appear on the cell phone. Select your symptom and tap save. Your monitor will record a sustained or significant arrhythmia regardless of you clicking the button. Some patients do not feel the heart rhythm irregularities. [employer] will notify us of any serious or critical events.  Refer to instruction booklet for instructions on switching batteries, changing strips, the Do not disturb or Pause features, or any additional questions.  Call [employer] at [phone], to confirm your monitor is transmitting and record your baseline. They will answer any questions you may have regarding the monitor instructions  at that time.  Returning the monitor to [employer] Place all equipment back into blue box. Peel off strip of paper to expose adhesive and close box securely. There is a prepaid UPS shipping label on this box. Drop in a UPS drop box, or at a UPS facility like [employer]. You may also contact [employer] to arrange UPS to pick up monitor package at your home.

## 2021-09-23 NOTE — Progress Notes (Signed)
Enrolled patient for a 30 day [employer] Event Monitor to be mailed to patients home  

## 2021-09-23 NOTE — Telephone Encounter (Signed)
Called [facility] requesting Ref. Appt for patient w Dr Terrace Arabia per AVS.  Left message w patient info.  09-23-21 VB

## 2021-09-24 ENCOUNTER — Emergency Department (HOSPITAL_COMMUNITY): Payer: Medicare Other

## 2021-09-24 ENCOUNTER — Emergency Department (HOSPITAL_COMMUNITY)
Admission: EM | Admit: 2021-09-24 | Discharge: 2021-09-24 | Disposition: A | Discharge status: Home / Self Care | Payer: Medicare Other | Attending: Emergency Medicine | Admitting: Emergency Medicine

## 2021-09-24 ENCOUNTER — Encounter (HOSPITAL_COMMUNITY): Payer: Self-pay | Admitting: Emergency Medicine

## 2021-09-24 ENCOUNTER — Other Ambulatory Visit: Payer: Self-pay

## 2021-09-24 DIAGNOSIS — Z79899 Other long term (current) drug therapy: Secondary | ICD-10-CM | POA: Diagnosis not present

## 2021-09-24 DIAGNOSIS — I480 Paroxysmal atrial fibrillation: Secondary | ICD-10-CM | POA: Diagnosis not present

## 2021-09-24 DIAGNOSIS — R55 Syncope and collapse: Secondary | ICD-10-CM | POA: Insufficient documentation

## 2021-09-24 DIAGNOSIS — Z7901 Long term (current) use of anticoagulants: Secondary | ICD-10-CM | POA: Insufficient documentation

## 2021-09-24 DIAGNOSIS — I129 Hypertensive chronic kidney disease with stage 1 through stage 4 chronic kidney disease, or unspecified chronic kidney disease: Secondary | ICD-10-CM | POA: Diagnosis not present

## 2021-09-24 DIAGNOSIS — Z743 Need for continuous supervision: Secondary | ICD-10-CM | POA: Diagnosis not present

## 2021-09-24 DIAGNOSIS — R6889 Other general symptoms and signs: Secondary | ICD-10-CM | POA: Diagnosis not present

## 2021-09-24 DIAGNOSIS — R42 Dizziness and giddiness: Secondary | ICD-10-CM | POA: Diagnosis not present

## 2021-09-24 DIAGNOSIS — N183 Chronic kidney disease, stage 3 unspecified: Secondary | ICD-10-CM | POA: Insufficient documentation

## 2021-09-24 DIAGNOSIS — R0902 Hypoxemia: Secondary | ICD-10-CM | POA: Diagnosis not present

## 2021-09-24 DIAGNOSIS — R404 Transient alteration of awareness: Secondary | ICD-10-CM | POA: Diagnosis not present

## 2021-09-24 LAB — COMPREHENSIVE METABOLIC PANEL
ALT: 14 U/L (ref 0–44)
AST: 18 U/L (ref 15–41)
Albumin: 3.6 g/dL (ref 3.5–5.0)
Alkaline Phosphatase: 51 U/L (ref 38–126)
Anion gap: 11 (ref 5–15)
BUN: 26 mg/dL — ABNORMAL HIGH (ref 8–23)
CO2: 24 mmol/L (ref 22–32)
Calcium: 9.8 mg/dL (ref 8.9–10.3)
Chloride: 108 mmol/L (ref 98–111)
Creatinine, Ser: 1.67 mg/dL — ABNORMAL HIGH (ref 0.44–1.00)
GFR, Estimated: 32 mL/min — ABNORMAL LOW (ref >60)
Glucose, Bld: 101 mg/dL — ABNORMAL HIGH (ref 70–99)
Potassium: 3.9 mmol/L (ref 3.5–5.1)
Sodium: 143 mmol/L (ref 135–145)
Total Bilirubin: 0.7 mg/dL (ref 0.3–1.2)
Total Protein: 6.4 g/dL — ABNORMAL LOW (ref 6.5–8.1)

## 2021-09-24 LAB — CBC
HCT: 33.2 % — ABNORMAL LOW (ref 36.0–46.0)
Hemoglobin: 10.2 g/dL — ABNORMAL LOW (ref 12.0–15.0)
MCH: 28.3 pg (ref 26.0–34.0)
MCHC: 30.7 g/dL (ref 30.0–36.0)
MCV: 92.2 fL (ref 80.0–100.0)
Platelets: 190 10*3/uL (ref 150–400)
RBC: 3.6 MIL/uL — ABNORMAL LOW (ref 3.87–5.11)
RDW: 14 % (ref 11.5–15.5)
WBC: 4.1 10*3/uL (ref 4.0–10.5)
nRBC: 0 % (ref 0.0–0.2)

## 2021-09-24 LAB — DIFFERENTIAL
Abs Immature Granulocytes: 0.01 10*3/uL (ref 0.00–0.07)
Basophils Absolute: 0 10*3/uL (ref 0.0–0.1)
Basophils Relative: 1 %
Eosinophils Absolute: 0.1 10*3/uL (ref 0.0–0.5)
Eosinophils Relative: 2 %
Immature Granulocytes: 0 %
Lymphocytes Relative: 36 %
Lymphs Abs: 1.5 10*3/uL (ref 0.7–4.0)
Monocytes Absolute: 0.4 10*3/uL (ref 0.1–1.0)
Monocytes Relative: 10 %
Neutro Abs: 2.2 10*3/uL (ref 1.7–7.7)
Neutrophils Relative %: 51 %

## 2021-09-24 LAB — PROTIME-INR
INR: 1.3 — ABNORMAL HIGH (ref 0.8–1.2)
Prothrombin Time: 16.1 seconds — ABNORMAL HIGH (ref 11.4–15.2)

## 2021-09-24 LAB — POC OCCULT BLOOD, ED: Fecal Occult Bld: NEGATIVE

## 2021-09-24 LAB — APTT: aPTT: 36 seconds (ref 24–36)

## 2021-09-24 MED ORDER — SODIUM CHLORIDE 0.9% FLUSH
3.0000 mL | Freq: Once | INTRAVENOUS | Status: DC
Start: 2021-09-24 — End: 2021-09-25

## 2021-09-24 MED ORDER — SODIUM CHLORIDE 0.9 % IV BOLUS: 500.0000 mL | Freq: Once | INTRAVENOUS | Status: DC

## 2021-09-24 NOTE — Discharge Instructions (Signed)
Your blood pressure has been on the low end while you have been here in the emergency room.  Decrease  your Norvasc to 5 mg daily and continue to monitor your blood pressure.  Follow-up with your primary care doctor and cardiologist as planned

## 2021-09-24 NOTE — ED Triage Notes (Signed)
Pt to triage via [facility] from home.  Reports near syncopal events x 1 week.  Seen by PCP this week and states PCP told her she may have had a mini stroke and ordered a MRI for next week.  Reports intermittent dizziness when standing.  Had syncopal episode today and family lowered her to ground.  Takes blood thinner for afib.  Denies numbness/weakness.  CBG 128 IV- 20g LFA NS 1000cc

## 2021-09-24 NOTE — ED Provider Notes (Signed)
[facility] EMERGENCY [facility]PARTMENT Provider Note   CSN: [id number] Arrival date & time: 09/24/21  1419     History Chief Complaint  Patient presents with   Near Syncope    Christine Hogan is a 75 y.o. female.   Near Syncope   Patient presents to the ED for evaluation of a syncopal episode today.  Patient states she was walking to the bathroom with assistance of a family member when she briefly lost consciousness.  Patient did not fall because a family member was with her and lowered her to the ground.  Patient does not think it was for a long period of time because she was able to talk to her family afterwards.  She denies any injury.  She is not having any trouble with chest pain or shortness of breath.  She is not having any focal numbness or weakness.  Patient states she has had episodes like this before.  She is not really sure what has caused them.  Last weekend she also had an episode where she was walking and she felt unsteady and the coordination of her legs were resolved.  She ended up following up with her doctor on Monday.  Patient had an evaluation and plans are for her to see a neurologist as well as have an MRI.  Since that episode last week and she has not had any issues with her balance or gait.  She does not have any focal numbness or weakness than the syncopal episode today.  Past Medical History:  Diagnosis Date   Arthritis    Atrial fibrillation (HCC)    Chest pain    a. 2014: NST with no evidence of ischemia   CKD (chronic kidney disease) stage 3, GFR 30-59 ml/min (HCC)    Diastolic dysfunction    grade 2 with dyspnea on exertion   H/O echocardiogram    a. 2014: echo showing EF of 65-70% with Grade 1 DD, moderate TR, and mild to moderate MR   Hyperlipidemia    Hypertension    Near syncope    Osteopenia     Patient Active Problem List   Diagnosis Date Noted   CKD (chronic kidney disease) stage 3, GFR 30-59 ml/min (HCC)    Abnormal finding  on MRI of brain 09/15/2019   Alteration of consciousness 08/26/2019   Syncope 05/26/2019   Nonrheumatic mitral (valve) insufficiency 10/08/2018   Vasovagal syncope 10/08/2018   Osteopenia    Diastolic dysfunction    Paroxysmal atrial fibrillation (HCC) 02/09/2017   Chronic renal insufficiency 06/28/2013   Chest pain 06/27/2013   Essential hypertension    Mixed hyperlipidemia     Past Surgical History:  Procedure Laterality Date   ABDOMINAL HYSTERECTOMY     TAH/BSO (menorrhagia)   bunion repair     HERNIA REPAIR       OB History   No obstetric history on file.     Family History  Problem Relation Age of Onset   Kidney failure Mother    Hypertension Father    Heart disease Father        pacemaker- followedby Dr. Royann Shiverscroitoru   Diabetes Sister    Hypertension Sister    Diabetes Sister    Hypertension Sister    Hypertension Daughter    Hypertension Daughter    Breast cancer Daughter     Social History   Tobacco Use   Smoking status: Never   Smokeless tobacco: Never  Vaping Use   Vaping  Use: Never used  Substance Use Topics   Alcohol use: Yes   Drug use: No    Home Medications Prior to Admission medications   Medication Sig Start Date End Date Taking? Authorizing Provider  amLODipine (NORVASC) 10 MG tablet TAKE 1 TABLET BY MOUTH  DAILY 12/21/20   Donita Brooks, MD  ELIQUIS 5 MG TABS tablet TAKE 1 TABLET BY MOUTH  TWICE DAILY 04/27/21   O'Neal, Ronnald Ramp, MD  hydrochlorothiazide (HYDRODIURIL) 12.5 MG tablet TAKE 1 TABLET BY MOUTH EVERY DAY 08/02/21   Croitoru, Mihai, MD  losartan (COZAAR) 100 MG tablet Take 1 tablet (100 mg total) by mouth daily. 07/19/20   Donita Brooks, MD  methocarbamol (ROBAXIN) 500 MG tablet Take 1 tablet (500 mg total) by mouth 3 (three) times daily. 09/21/20   Donita Brooks, MD  metoprolol tartrate (LOPRESSOR) 50 MG tablet TAKE 1 TABLET BY MOUTH TWICE A DAY 08/02/21   Croitoru, Mihai, MD  Multiple Vitamins-Minerals (CENTRUM  SILVER 50+WOMEN) TABS Take 1 tablet by mouth daily with breakfast.    [provider]  nitroGLYCERIN (NITROSTAT) 0.4 MG SL tablet Place 1 tablet (0.4 mg total) under the tongue every 5 (five) minutes as needed for chest pain. 12/11/13   Salley Scarlet, MD  rosuvastatin (CRESTOR) 10 MG tablet TAKE 1 TABLET BY MOUTH ONCE DAILY 02/24/21   Donita Brooks, MD    Allergies    Codeine  Review of Systems   Review of Systems  Cardiovascular:  Positive for near-syncope.  All other systems reviewed and are negative.  Physical Exam Updated Vital Signs BP 104/67   Pulse 70   Temp (!) 97.4 F (36.3 C)   Resp 18   SpO2 100%   Physical Exam Vitals and nursing note reviewed.  Constitutional:      General: She is not in acute distress.    Appearance: She is well-developed.  HENT:     Head: Normocephalic and atraumatic.     Right Ear: External ear normal.     Left Ear: External ear normal.  Eyes:     General: No scleral icterus.       Right eye: No discharge.        Left eye: No discharge.     Conjunctiva/sclera: Conjunctivae normal.  Neck:     Trachea: No tracheal deviation.  Cardiovascular:     Rate and Rhythm: Normal rate and regular rhythm.  Pulmonary:     Effort: Pulmonary effort is normal. No respiratory distress.     Breath sounds: Normal breath sounds. No stridor. No wheezing or rales.  Abdominal:     General: Bowel sounds are normal. There is no distension.     Palpations: Abdomen is soft.     Tenderness: There is no abdominal tenderness. There is no guarding or rebound.  Musculoskeletal:        General: No tenderness.     Cervical back: Neck supple.  Skin:    General: Skin is warm and dry.     Findings: No rash.  Neurological:     Mental Status: She is alert and oriented to person, place, and time.     Cranial Nerves: No cranial nerve deficit (No facial droop, extraocular movements intact, tongue midline ).     Sensory: No sensory deficit.     Motor: No  abnormal muscle tone or seizure activity.     Coordination: Coordination normal.     Comments: No pronator drift bilateral upper extrem,  able to hold both legs off bed for 5 seconds, sensation intact in all extremities, no visual field cuts, no left or right sided neglect, normal finger-nose exam bilaterally, no nystagmus noted     ED Results / Procedures / Treatments   Labs (all labs ordered are listed, but only abnormal results are displayed) Labs Reviewed  PROTIME-INR - Abnormal; Notable for the following components:      Result Value   Prothrombin Time 16.1 (*)    INR 1.3 (*)    All other components within normal limits  CBC - Abnormal; Notable for the following components:   RBC 3.60 (*)    Hemoglobin 10.2 (*)    HCT 33.2 (*)    All other components within normal limits  COMPREHENSIVE METABOLIC PANEL - Abnormal; Notable for the following components:   Glucose, Bld 101 (*)    BUN 26 (*)    Creatinine, Ser 1.67 (*)    Total Protein 6.4 (*)    GFR, Estimated 32 (*)    All other components within normal limits  APTT  DIFFERENTIAL  POC OCCULT BLOOD, ED    EKG EKG Interpretation  Date/Time:  Saturday September 24 2021 18:15:51 EST Ventricular Rate:  69 PR Interval:  220 QRS Duration: 107 QT Interval:  435 QTC Calculation: 466 R Axis:   81 Text Interpretation: Sinus rhythm Ventricular trigeminy Prolonged PR interval Borderline right axis deviation Anteroseptal infarct, old No significant change since last tracing Confirmed by Dorie Rank [phone]) on 09/24/2021 6:18:45 PM  Radiology CT HEAD WO CONTRAST  Result Date: 09/24/2021 CLINICAL DATA:  Syncope EXAM: CT HEAD WITHOUT CONTRAST TECHNIQUE: Contiguous axial images were obtained from the base of the skull through the vertex without intravenous contrast. COMPARISON:  CT head 09/12/2020 FINDINGS: Brain: No acute intracranial hemorrhage, mass effect, or herniation. No extra-axial fluid collections. No evidence of acute  territorial infarct. No hydrocephalus. Mild patchy hypodensities in the periventricular and subcortical white matter, likely secondary to chronic microvascular ischemic changes. Vascular: No hyperdense vessel or unexpected calcification. Skull: Normal. Negative for fracture or focal lesion. Sinuses/Orbits: No acute finding. Other: None. IMPRESSION: No acute intracranial process identified. Consider follow-up with MRI as clinically indicated. Electronically Signed   By: Ofilia Neas M.D.   On: 09/24/2021 16:40    Procedures Procedures   Medications Ordered in ED Medications  sodium chloride flush (NS) 0.9 % injection 3 mL (0 mLs Intravenous Hold 09/24/21 1524)  sodium chloride 0.9 % bolus 500 mL (500 mLs Intravenous Not Given 09/24/21 1802)    ED Course  I have reviewed the triage vital signs and the nursing notes.  Pertinent labs & imaging results that were available during my care of the patient were reviewed by me and considered in my medical decision making (see chart for details).  Clinical Course as of 09/24/21 1842  Sat Sep 24, 2021  1740 Hemoglobin decreased from 5 days ago [JK]  1741 Creatinine is stable compared to previous although BUN is slightly increased [JK]  1741 CT without acute findings [JK]  1809 Stool negative for blood [JK]  1818 IV fluids offered, patient refused [JK]    Clinical Course User Index [JK] Dorie Rank, MD   MDM Rules/Calculators/A&P                           Patient presented to the ED for evaluation of a syncopal episode.  In the ED the patient was overall feeling well.  No signs of infection.  She has not had any vomiting or diarrhea to suggest dehydration.  Her neurologic exam is normal.   Patient's ED work-up is reassuring.  There was a slight decrease in her hemoglobin however her guaiac is negative and the hemoglobin is similar to previous values.  Patient is on several blood pressure medications and her blood pressure has been low normal  while in the ED.  I suspect this could be a contributing factor to her symptoms.  Patient was able to walk around the ED without recurrent syncopal symptoms or dizziness.  We will have her decrease her Norvasc dosing and follow-up with her cardiologist and primary care Final Clinical Impression(s) / ED Diagnoses Final diagnoses:  Syncope and collapse    Rx / DC Orders ED Discharge Orders     None        Dorie Rank, MD 09/24/21 1842

## 2021-10-03 ENCOUNTER — Ambulatory Visit (INDEPENDENT_AMBULATORY_CARE_PROVIDER_SITE_OTHER): Payer: Medicare Other

## 2021-10-03 ENCOUNTER — Telehealth: Payer: Self-pay

## 2021-10-03 DIAGNOSIS — I480 Paroxysmal atrial fibrillation: Secondary | ICD-10-CM | POA: Diagnosis not present

## 2021-10-03 DIAGNOSIS — R55 Syncope and collapse: Secondary | ICD-10-CM

## 2021-10-03 NOTE — Telephone Encounter (Addendum)
Called patient regarding rsults. Advised TSH normal.----- Message from Jodelle Gross, NP sent at 09/30/2021 11:05 AM EST ----- I have reviewed lab. TSH normal  KL

## 2021-10-04 DIAGNOSIS — R55 Syncope and collapse: Secondary | ICD-10-CM | POA: Diagnosis not present

## 2021-10-04 DIAGNOSIS — I480 Paroxysmal atrial fibrillation: Secondary | ICD-10-CM | POA: Diagnosis not present

## 2021-10-15 ENCOUNTER — Other Ambulatory Visit: Payer: Medicare Other

## 2021-10-24 ENCOUNTER — Telehealth: Payer: Self-pay | Admitting: *Deleted

## 2021-10-24 ENCOUNTER — Telehealth: Payer: Medicare Other

## 2021-10-24 NOTE — Telephone Encounter (Signed)
°  Care Management   Follow Up Note   10/24/2021 Name: Christine Hogan MRN: [id number] DOB: [birth year]   Referred by: Donita Brooks, MD Reason for referral : Chronic Care Management (HTN, PAF)   An unsuccessful telephone outreach was attempted today. The patient was referred to the case management team for assistance with care management and care coordination.   Patient answered the phone and poor connection noted, call dropped.  Follow Up Plan: Telephone follow up appointment with care management team member scheduled for: upon care guide rescheduling.  Irving Shows RNC, BSN RN Case Manager [facility] [phone]

## 2021-10-31 ENCOUNTER — Ambulatory Visit
Admission: RE | Admit: 2021-10-31 | Discharge: 2021-10-31 | Disposition: A | Discharge status: Home / Self Care | Payer: Medicare Other | Source: Ambulatory Visit | Attending: Family Medicine | Admitting: Family Medicine

## 2021-10-31 DIAGNOSIS — R55 Syncope and collapse: Secondary | ICD-10-CM | POA: Diagnosis not present

## 2021-10-31 DIAGNOSIS — R270 Ataxia, unspecified: Secondary | ICD-10-CM | POA: Diagnosis not present

## 2021-10-31 DIAGNOSIS — I6782 Cerebral ischemia: Secondary | ICD-10-CM | POA: Diagnosis not present

## 2021-10-31 MED ORDER — GADOBENATE DIMEGLUMINE 529 MG/ML IV SOLN
15.0000 mL | Freq: Once | INTRAVENOUS | Status: AC | PRN
  Administered 2021-10-31: 11:00:00 15 mL via INTRAVENOUS

## 2021-11-18 ENCOUNTER — Telehealth: Payer: Self-pay

## 2021-11-18 NOTE — Telephone Encounter (Addendum)
Called patient regarding results. Patient had understanding of results.----- Message from Lendon Colonel, NP sent at 11/18/2021  7:42 AM EST ----- NO evidence of severe bradycardia or arrhthymias. No changes in regimen.

## 2021-11-23 ENCOUNTER — Other Ambulatory Visit: Payer: Self-pay | Admitting: Family Medicine

## 2021-11-25 ENCOUNTER — Ambulatory Visit: Payer: Medicare Other | Admitting: Adult Health

## 2021-12-02 ENCOUNTER — Ambulatory Visit (INDEPENDENT_AMBULATORY_CARE_PROVIDER_SITE_OTHER): Payer: Medicare Other | Admitting: *Deleted

## 2021-12-02 DIAGNOSIS — I10 Essential (primary) hypertension: Secondary | ICD-10-CM

## 2021-12-02 DIAGNOSIS — I480 Paroxysmal atrial fibrillation: Secondary | ICD-10-CM

## 2021-12-02 NOTE — Chronic Care Management (AMB) (Signed)
Chronic Care Management   CCM RN Visit Note  12/02/2021 Name: Christine Hogan MRN: [id number] DOB: 01/06/1946  Subjective: Christine Hogan is a 76 y.o. year old female who is a primary care patient of Pickard, Cammie Mcgee, MD. The care management team was consulted for assistance with disease management and care coordination needs.    Engaged with patient by telephone for follow up visit in response to provider referral for case management and/or care coordination services.   Consent to Services:  The patient was given information about Chronic Care Management services, agreed to services, and gave verbal consent prior to initiation of services.  Please see initial visit note for detailed documentation.   Patient agreed to services and verbal consent obtained.   Assessment: Review of patient past medical history, allergies, medications, health status, including review of consultants reports, laboratory and other test data, was performed as part of comprehensive evaluation and provision of chronic care management services.   SDOH (Social Determinants of Health) assessments and interventions performed:    CCM Care Plan  Allergies  Allergen Reactions   Codeine Nausea And Vomiting and Other (See Comments)    Dizziness, also    Outpatient Encounter Medications as of 12/02/2021  Medication Sig Note   amLODipine (NORVASC) 10 MG tablet TAKE 1 TABLET BY MOUTH  DAILY    ELIQUIS 5 MG TABS tablet TAKE 1 TABLET BY MOUTH  TWICE DAILY    hydrochlorothiazide (HYDRODIURIL) 12.5 MG tablet TAKE 1 TABLET BY MOUTH EVERY DAY    losartan (COZAAR) 100 MG tablet Take 1 tablet (100 mg total) by mouth daily.    methocarbamol (ROBAXIN) 500 MG tablet Take 1 tablet (500 mg total) by mouth 3 (three) times daily.    metoprolol tartrate (LOPRESSOR) 50 MG tablet TAKE 1 TABLET BY MOUTH TWICE A DAY    Multiple Vitamins-Minerals (CENTRUM SILVER 50+WOMEN) TABS Take 1 tablet by mouth daily with breakfast.    rosuvastatin  (CRESTOR) 10 MG tablet TAKE 1 TABLET BY MOUTH ONCE DAILY    nitroGLYCERIN (NITROSTAT) 0.4 MG SL tablet Place 1 tablet (0.4 mg total) under the tongue every 5 (five) minutes as needed for chest pain. (Patient not taking: Reported on 12/02/2021) 09/12/2020: Provider: The patient's medication has EXPIRED- will need a new Rx if she is to continue carrying this   No facility-administered encounter medications on file as of 12/02/2021.    Patient Active Problem List   Diagnosis Date Noted   CKD (chronic kidney disease) stage 3, GFR 30-59 ml/min (HCC)    Abnormal finding on MRI of brain 09/15/2019   Alteration of consciousness 08/26/2019   Syncope 05/26/2019   Nonrheumatic mitral (valve) insufficiency 10/08/2018   Vasovagal syncope 10/08/2018   Osteopenia    Diastolic dysfunction    Paroxysmal atrial fibrillation ([place]) 02/09/2017   Chronic renal insufficiency 06/28/2013   Chest pain 06/27/2013   Essential hypertension    Mixed hyperlipidemia     Conditions to be addressed/monitored:Atrial Fibrillation and HTN  Care Plan : RN Care Manager Plan of Care  Updates made by Kassie Mends, RN since 12/02/2021 12:00 AM     Problem: No plan of care established for management of chronic disease states  (HTN, PAF)   Priority: High     Long-Range Goal: Development of plan of care for chronic disease management  (HTN, PAF)   Start Date: 12/02/2021  Expected End Date: 05/31/2022  Priority: High  Note:   Current Barriers:  Knowledge Deficits related to  plan of care for management of Atrial Fibrillation and HTN  Knowledge deficits related to self health management of chronic afib- needs reinforcement for action plan / management atrial fibrillation. Pt reports her adult son lives there with her but pt states she is overall independent with all aspects of her care, has a cane "just in case" , she continues to drive, gets out to church and visits friends.    Chronic Disease Management support and  education needs related to afib- pt denies any dizziness, no further episodes of syncope, reports had MRI and "everything good", wore heart monitor and "everything ok with that". No Advanced Directives in place- pt reports she received advanced directives packet Knowledge Deficits related to basic understanding of hypertension pathophysiology and self care management- pt reports checks blood pressure 1-2 x per week with readings "most of all of them good" Reports taking all medications as prescribed. Reports sometimes she has difficulty sleeping and takes melatonin at times, does not take day time naps. Does not adhere to provider recommendations re:   does not adhere to any special diet  RNCM Clinical Goal(s):  Patient will verbalize understanding of plan for management of Atrial Fibrillation and HTN as evidenced by patient report, review of EHR and  through collaboration with RN Care manager, provider, and care team.   Interventions: 1:1 collaboration with primary care provider regarding development and update of comprehensive plan of care as evidenced by provider attestation and co-signature Inter-disciplinary care team collaboration (see longitudinal plan of care) Evaluation of current treatment plan related to  self management and patient's adherence to plan as established by provider   AFIB Interventions: (Status:  Goal on track:  Yes.) Long Term Goal   Counseled on importance of regular laboratory monitoring as prescribed Afib action plan reviewed Pain assessment completed Reviewed upcoming scheduled appointments Education mailed- Atrial Fibrillation action plan  Hypertension Interventions:  (Status:  Goal on track:  Yes.) Long Term Goal Last practice recorded BP readings:  BP Readings from Last 3 Encounters:  09/24/21 104/67  09/23/21 (!) 152/74  09/19/21 138/68  Most recent eGFR/CrCl:  Lab Results  Component Value Date   EGFR 33 (L) 09/19/2021    No components found for:  CRCL  Evaluation of current treatment plan related to hypertension self management and patient's adherence to plan as established by provider Counseled on the importance of exercise goals with target of 150 minutes per week Discussed plans with patient for ongoing care management follow up and provided patient with direct contact information for care management team Advised patient, providing education and rationale, to monitor blood pressure daily and record, calling PCP for findings outside established parameters  Reinforced low sodium diet  Patient Goals/Self-Care Activities: Take medications as prescribed   Attend all scheduled provider appointments Perform all self care activities independently  Perform IADL's (shopping, preparing meals, housekeeping, managing finances) independently Call provider office for new concerns or questions  - check pulse (heart) rate once a day - make a plan to exercise regularly - make a plan to eat healthy - keep all lab appointments - take medicine as prescribed check blood pressure 3 times per week write blood pressure results in a log or diary keep a blood pressure log take blood pressure log to all doctor appointments take medications for blood pressure exactly as prescribed Try to do some walking, get outside on warm days Follow low sodium diet- read labels Look over education sent- Atrial Fibrillation action plan  Plan:Telephone follow up appointment with care management team member scheduled for:  02/24/22  Jacqlyn Larsen [facility], BSN RN Case Manager [facility] [phone]

## 2021-12-02 NOTE — Patient Instructions (Signed)
Visit Information  Thank you for taking time to visit with me today. Please don't hesitate to contact me if I can be of assistance to you before our next scheduled telephone appointment.  Following are the goals we discussed today:  Take medications as prescribed   Attend all scheduled provider appointments Perform all self care activities independently  Perform IADL's (shopping, preparing meals, housekeeping, managing finances) independently Call provider office for new concerns or questions  check pulse (heart) rate once a day make a plan to exercise regularly make a plan to eat healthy keep all lab appointments take medicine as prescribed check blood pressure 3 times per week write blood pressure results in a log or diary keep a blood pressure log take blood pressure log to all doctor appointments take medications for blood pressure exactly as prescribed Try to do some walking, get outside on warm days Follow low sodium diet- read labels Look over education sent- Atrial Fibrillation action plan  Our next appointment is by telephone on 02/24/22 at 3 pm  Please call the care guide team at [phone] if you need to cancel or reschedule your appointment.   If you are experiencing a Mental Health or [place] or need someone to talk to, please call the [place] National Suicide Prevention Lifeline: 731-879-7095 or TTY: 478 298 4626 TTY [phone]) to talk to a trained counselor call 1-800-273-TALK (toll free, 24 hour hotline) go to [facility] [place] [phone]) call 911   The patient verbalized understanding of instructions, educational materials, and care plan provided today and agreed to receive a mailed copy of patient instructions, educational materials, and care plan.   Jacqlyn Larsen RNC, BSN RN Case Manager [facility] [phone] Atrial Fibrillation Atrial fibrillation is a type  of heartbeat that is irregular or fast. If you have this condition, your heart beats without any order. This makes it hard for your heart to pump blood in a normal way. Atrial fibrillation may come and go, or it may become a long-lasting problem. If this condition is not treated, it can put you at higher risk for stroke, heart failure, and other heart problems. What are the causes? This condition may be caused by diseases that damage the heart. They include: High blood pressure. Heart failure. Heart valve disease. Heart surgery. Other causes include: Diabetes. Thyroid disease. Being overweight. Kidney disease. Sometimes the cause is not known. What increases the risk? You are more likely to develop this condition if: You are older. You smoke. You exercise often and very hard. You have a family history of this condition. You are a man. You use drugs. You drink a lot of alcohol. You have lung conditions, such as emphysema, pneumonia, or COPD. You have sleep apnea. What are the signs or symptoms? Common symptoms of this condition include: A feeling that your heart is beating very fast. Chest pain or discomfort. Feeling short of breath. Suddenly feeling light-headed or weak. Getting tired easily during activity. Fainting. Sweating. In some cases, there are no symptoms. How is this treated? Treatment for this condition depends on underlying conditions and how you feel when you have atrial fibrillation. They include: Medicines to: Prevent blood clots. Treat heart rate or heart rhythm problems. Using devices, such as a pacemaker, to correct heart rhythm problems. Doing surgery to remove the part of the heart that sends bad signals. Closing an area where clots can form in the heart (left atrial appendage). In some cases,  your doctor will treat other underlying conditions. Follow these instructions at home: Medicines Take over-the-counter and prescription medicines only as told  by your doctor. Do not take any new medicines without first talking to your doctor. If you are taking blood thinners: Talk with your doctor before you take any medicines that have aspirin or NSAIDs, such as ibuprofen, in them. Take your medicine exactly as told by your doctor. Take it at the same time each day. Avoid activities that could hurt or bruise you. Follow instructions about how to prevent falls. Wear a bracelet that says you are taking blood thinners. Or, carry a card that lists what medicines you take. Lifestyle   Do not use any products that have nicotine or tobacco in them. These include cigarettes, e-cigarettes, and chewing tobacco. If you need help quitting, ask your doctor. Eat heart-healthy foods. Talk with your doctor about the right eating plan for you. Exercise regularly as told by your doctor. Do not drink alcohol. Lose weight if you are overweight. Do not use drugs, including cannabis. General instructions If you have a condition that causes breathing to stop for a short period of time (apnea), treat it as told by your doctor. Keep a healthy weight. Do not use diet pills unless your doctor says they are safe for you. Diet pills may make heart problems worse. Keep all follow-up visits as told by your doctor. This is important. Contact a doctor if: You notice a change in the speed, rhythm, or strength of your heartbeat. You are taking a blood-thinning medicine and you get more bruising. You get tired more easily when you move or exercise. You have a sudden change in weight. Get help right away if:  You have pain in your chest or your belly (abdomen). You have trouble breathing. You have side effects of blood thinners, such as blood in your vomit, poop (stool), or pee (urine), or bleeding that cannot stop. You have any signs of a stroke. "BE FAST" is an easy way to remember the main warning signs: B - Balance. Signs are dizziness, sudden trouble walking, or loss of  balance. E - Eyes. Signs are trouble seeing or a change in how you see. F - Face. Signs are sudden weakness or loss of feeling in the face, or the face or eyelid drooping on one side. A - Arms. Signs are weakness or loss of feeling in an arm. This happens suddenly and usually on one side of the body. S - Speech. Signs are sudden trouble speaking, slurred speech, or trouble understanding what people say. T - Time. Time to call emergency services. Write down what time symptoms started. You have other signs of a stroke, such as: A sudden, very bad headache with no known cause. Feeling like you may vomit (nausea). Vomiting. A seizure. These symptoms may be an emergency. Do not wait to see if the symptoms will go away. Get medical help right away. Call your local emergency services (911 in the [place] Do not drive yourself to the hospital. Summary Atrial fibrillation is a type of heartbeat that is irregular or fast. You are at higher risk of this condition if you smoke, are older, have diabetes, or are overweight. Follow your doctor's instructions about medicines, diet, exercise, and follow-up visits. Get help right away if you have signs or symptoms of a stroke. Get help right away if you cannot catch your breath, or you have chest pain or discomfort. This information is not intended to replace  advice given to you by your health care provider. Make sure you discuss any questions you have with your health care provider. Document Revised: 04/16/2019 Document Reviewed: 04/16/2019 Elsevier Patient Education  [place].

## 2021-12-06 DIAGNOSIS — I10 Essential (primary) hypertension: Secondary | ICD-10-CM

## 2021-12-06 DIAGNOSIS — I480 Paroxysmal atrial fibrillation: Secondary | ICD-10-CM

## 2021-12-13 ENCOUNTER — Other Ambulatory Visit: Payer: Self-pay

## 2021-12-13 ENCOUNTER — Other Ambulatory Visit: Payer: Self-pay | Admitting: Cardiovascular Disease

## 2021-12-13 ENCOUNTER — Ambulatory Visit (INDEPENDENT_AMBULATORY_CARE_PROVIDER_SITE_OTHER): Payer: Medicare Other | Admitting: Nurse Practitioner

## 2021-12-13 VITALS — BP 148/72 | HR 96 | Ht 65.0 in | Wt 158.0 lb

## 2021-12-13 DIAGNOSIS — L309 Dermatitis, unspecified: Secondary | ICD-10-CM

## 2021-12-13 MED ORDER — TRIAMCINOLONE ACETONIDE 0.1 % EX OINT: 1.0000 "application " | TOPICAL_OINTMENT | Freq: Two times a day (BID) | CUTANEOUS | 0 refills | Status: DC

## 2021-12-13 NOTE — Progress Notes (Signed)
Subjective:    Patient ID: Christine Hogan, female    DOB: [birth year], 76 y.o.   MRN: [id number]  HPI: Christine Hogan is a 76 y.o. female presenting for  No chief complaint on file.  RASH Patient reports she used an off brand emollient to moisturize her legs a few days ago and broke out in a rash.  She washed off in the shower, but the rash has been persistent.  It itches and keeps her awake at night.  Duration:  days - 3  Location: ankles and feet  bilaterally Itching: yes Burning: yes Redness: yes Oozing: no Scaling: no Blisters: no Painful: no Fevers: no Change in detergents/soaps/personal care products: no Recent illness: no Recent travel:no History of same: no Context: stable Alleviating factors: Benadryl Treatments attempted:benadryl and lotion/moisturizer Shortness of breath: no  Throat/tongue swelling: no Myalgias/arthralgias: no  Allergies  Allergen Reactions   Codeine Nausea And Vomiting and Other (See Comments)    Dizziness, also    Outpatient Encounter Medications as of 12/13/2021  Medication Sig Note   triamcinolone ointment (KENALOG) 0.1 % Apply 1 application topically 2 (two) times daily. Apply small amount twice daily for 7 days; notify prescriber if symptoms not improved after 7 days    amLODipine (NORVASC) 10 MG tablet TAKE 1 TABLET BY MOUTH  DAILY    ELIQUIS 5 MG TABS tablet TAKE 1 TABLET BY MOUTH  TWICE DAILY    hydrochlorothiazide (HYDRODIURIL) 12.5 MG tablet TAKE 1 TABLET BY MOUTH EVERY DAY    losartan (COZAAR) 100 MG tablet Take 1 tablet (100 mg total) by mouth daily.    methocarbamol (ROBAXIN) 500 MG tablet Take 1 tablet (500 mg total) by mouth 3 (three) times daily.    metoprolol tartrate (LOPRESSOR) 50 MG tablet TAKE 1 TABLET BY MOUTH TWICE A DAY    Multiple Vitamins-Minerals (CENTRUM SILVER 50+WOMEN) TABS Take 1 tablet by mouth daily with breakfast.    nitroGLYCERIN (NITROSTAT) 0.4 MG SL tablet Place 1 tablet (0.4 mg total) under the tongue  every 5 (five) minutes as needed for chest pain. (Patient not taking: Reported on 12/02/2021) 09/12/2020: Provider: The patient's medication has EXPIRED- will need a new Rx if she is to continue carrying this   rosuvastatin (CRESTOR) 10 MG tablet TAKE 1 TABLET BY MOUTH ONCE DAILY    No facility-administered encounter medications on file as of 12/13/2021.    Patient Active Problem List   Diagnosis Date Noted   CKD (chronic kidney disease) stage 3, GFR 30-59 ml/min (HCC)    Abnormal finding on MRI of brain 09/15/2019   Alteration of consciousness 08/26/2019   Syncope 05/26/2019   Nonrheumatic mitral (valve) insufficiency 10/08/2018   Vasovagal syncope 10/08/2018   Osteopenia    Diastolic dysfunction    Paroxysmal atrial fibrillation (HCC) 02/09/2017   Chronic renal insufficiency 06/28/2013   Chest pain 06/27/2013   Essential hypertension    Mixed hyperlipidemia     Past Medical History:  Diagnosis Date   Arthritis    Atrial fibrillation (HCC)    Chest pain    a. 2014: NST with no evidence of ischemia   CKD (chronic kidney disease) stage 3, GFR 30-59 ml/min (HCC)    Diastolic dysfunction    grade 2 with dyspnea on exertion   H/O echocardiogram    a. 2014: echo showing EF of 65-70% with Grade 1 DD, moderate TR, and mild to moderate MR   Hyperlipidemia    Hypertension    Near  syncope    Osteopenia     Relevant past medical, surgical, family and social history reviewed and updated as indicated. Interim medical history since our last visit reviewed.  Review of Systems Per HPI unless specifically indicated above     Objective:    There were no vitals taken for this visit.  Wt Readings from Last 3 Encounters:  09/23/21 157 lb 9.6 oz (71.5 kg)  09/19/21 161 lb (73 kg)  11/02/20 160 lb (72.6 kg)    Physical Exam Vitals and nursing note reviewed.  Constitutional:      General: She is not in acute distress.    Appearance: Normal appearance. She is not toxic-appearing.   Pulmonary:     Effort: Pulmonary effort is normal. No respiratory distress.     Breath sounds: Normal breath sounds. No wheezing, rhonchi or rales.  Skin:    General: Skin is warm and dry.     Capillary Refill: Capillary refill takes less than 2 seconds.     Findings: Rash present. Rash is urticarial.          Comments: Slightly erythematous patches of confluent lesions to bilaterally lower extremities and top of feet symmetrically; indistinct borders; no central clearing, oozing, fluctuance, or warmth  Neurological:     Mental Status: She is alert and oriented to person, place, and time.     Motor: No weakness.     Gait: Gait normal.      Assessment & Plan:  1. Dermatitis Suspect contact dermatitis due to new moisturizer.  Encouraged avoiding moisturizer and use hypoallergenic formulations. Start triamcinolone ointment twice daily for 7 days.  Okay to use Benadryl to decrease histamine response and prevent itching at night time.  Also discussed nondrowsy antihistamine options that may be safer for patient including cetirizine and loratadine.  Return to clinic with no improvement after 7 days.   - triamcinolone ointment (KENALOG) 0.1 %; Apply 1 application topically 2 (two) times daily. Apply small amount twice daily for 7 days; notify prescriber if symptoms not improved after 7 days  Dispense: 30 g; Refill: 0    Follow up plan: Return if symptoms worsen or fail to improve.

## 2021-12-15 ENCOUNTER — Ambulatory Visit: Payer: Medicare Other | Admitting: Family Medicine

## 2021-12-22 ENCOUNTER — Other Ambulatory Visit: Payer: Self-pay

## 2021-12-22 ENCOUNTER — Ambulatory Visit (INDEPENDENT_AMBULATORY_CARE_PROVIDER_SITE_OTHER): Payer: Medicare Other | Admitting: Family Medicine

## 2021-12-22 VITALS — HR 70 | Temp 98.7°F | Wt 167.0 lb

## 2021-12-22 DIAGNOSIS — M25561 Pain in right knee: Secondary | ICD-10-CM

## 2021-12-22 DIAGNOSIS — I872 Venous insufficiency (chronic) (peripheral): Secondary | ICD-10-CM

## 2021-12-22 NOTE — Progress Notes (Signed)
Subjective:    Patient ID: Christine Hogan, female    DOB: 1945/12/09, 76 y.o.   MRN: [id number]  HPI Patient recently saw my partner for dermatitis around both ankles.  It appears as though she has venous stasis dermatitis due to leg swelling.  She has numerous varicose veins and reports that she has significant pitting edema last week.  The topical steroid prescribed by my former seem to help with the rash.  She is here today to discuss further options tto prevent this.   She also request a cortisone shot in the right knee.  She reports posterior lateral knee pain and stiffness particularly with standing and with walking. Past Medical History:  Diagnosis Date   Arthritis    Atrial fibrillation ([place])    Chest pain    a. 2014: NST with no evidence of ischemia   CKD (chronic kidney disease) stage 3, GFR 30-59 ml/min (HCC)    Diastolic dysfunction    grade 2 with dyspnea on exertion   H/O echocardiogram    a. 2014: echo showing EF of 65-70% with Grade 1 DD, moderate TR, and mild to moderate MR   Hyperlipidemia    Hypertension    Near syncope    Osteopenia    Past Surgical History:  Procedure Laterality Date   ABDOMINAL HYSTERECTOMY     TAH/BSO (menorrhagia)   bunion repair     HERNIA REPAIR     Current Outpatient Medications on File Prior to Visit  Medication Sig Dispense Refill   amLODipine (NORVASC) 10 MG tablet TAKE 1 TABLET BY MOUTH  DAILY 90 tablet 3   ELIQUIS 5 MG TABS tablet TAKE 1 TABLET BY MOUTH  TWICE DAILY 180 tablet 3   hydrochlorothiazide (HYDRODIURIL) 12.5 MG tablet TAKE 1 TABLET BY MOUTH EVERY DAY 90 tablet 0   losartan (COZAAR) 100 MG tablet Take 1 tablet (100 mg total) by mouth daily. 90 tablet 0   methocarbamol (ROBAXIN) 500 MG tablet Take 1 tablet (500 mg total) by mouth 3 (three) times daily. 10 tablet 0   metoprolol tartrate (LOPRESSOR) 50 MG tablet TAKE 1 TABLET BY MOUTH TWICE A DAY 180 tablet 0   Multiple Vitamins-Minerals (CENTRUM SILVER 50+WOMEN) TABS Take 1  tablet by mouth daily with breakfast.     nitroGLYCERIN (NITROSTAT) 0.4 MG SL tablet Place 1 tablet (0.4 mg total) under the tongue every 5 (five) minutes as needed for chest pain. (Patient not taking: Reported on 12/02/2021) 20 tablet 0   rosuvastatin (CRESTOR) 10 MG tablet TAKE 1 TABLET BY MOUTH ONCE DAILY 90 tablet 3   triamcinolone ointment (KENALOG) 0.1 % Apply 1 application topically 2 (two) times daily. Apply small amount twice daily for 7 days; notify prescriber if symptoms not improved after 7 days 30 g 0   No current facility-administered medications on file prior to visit.   Allergies  Allergen Reactions   Codeine Nausea And Vomiting and Other (See Comments)    Dizziness, also   Social History   Socioeconomic History   Marital status: Divorced    Spouse name: Not on file   Number of children: 5   Years of education: 12th grade   Highest education level: Not on file  Occupational History   Occupation: Retired  Tobacco Use   Smoking status: Never   Smokeless tobacco: Never  Vaping Use   Vaping Use: Never used  Substance and Sexual Activity   Alcohol use: Yes   Drug use: No  Sexual activity: Yes  Other Topics Concern   Not on file  Social History Narrative   Lives with her son.   Right-handed.   Four living children.   Occasional caffeine use.   Social Determinants of Health   Financial Resource Strain: Low Risk    Difficulty of Paying Living Expenses: Not hard at all  Food Insecurity: No Food Insecurity   Worried About Charity fundraiser in the Last Year: Never true   [place] in the Last Year: Never true  Transportation Needs: No Transportation Needs   Lack of Transportation (Medical): No   Lack of Transportation (Non-Medical): No  Physical Activity: Inactive   Days of Exercise per Week: 0 days   Minutes of Exercise per Session: 0 min  Stress: No Stress Concern Present   Feeling of Stress : Not at all  Social Connections: Moderately Isolated    Frequency of Communication with Friends and Family: More than three times a week   Frequency of Social Gatherings with Friends and Family: More than three times a week   Attends Religious Services: More than 4 times per year   Active Member of [employer] or Organizations: No   Attends Archivist Meetings: Never   Marital Status: Divorced  Human resources officer Violence: Not At Risk   Fear of Current or Ex-Partner: No   Emotionally Abused: No   Physically Abused: No   Sexually Abused: No     Review of Systems  All other systems reviewed and are negative.     Objective:   Physical Exam Vitals reviewed.  Constitutional:      General: She is not in acute distress.    Appearance: Normal appearance. She is normal weight. She is not ill-appearing, toxic-appearing or diaphoretic.  Neck:     Vascular: No carotid bruit.  Cardiovascular:     Rate and Rhythm: Normal rate and regular rhythm.     Pulses: Normal pulses.     Heart sounds: Normal heart sounds. No murmur heard.   No friction rub. No gallop.  Pulmonary:     Effort: Pulmonary effort is normal. No respiratory distress.     Breath sounds: Normal breath sounds. No stridor. No wheezing, rhonchi or rales.  Abdominal:     General: Abdomen is flat. Bowel sounds are normal.     Palpations: Abdomen is soft.  Musculoskeletal:     Right knee: Decreased range of motion. Tenderness present over the lateral joint line.  Skin:    Findings: Rash present.       Neurological:     Mental Status: She is alert.          Assessment & Plan:  Acute pain of right knee  Venous stasis dermatitis of both lower extremities  I believe the rash on both ankles is venous stasis dermatitis.  The patient has numerous large varicose veins in both legs.  She has spider veins bilaterally.  She has pitting edema bilaterally.  I explained to her the association of dermatitis with venous insufficiency.  Recommended using compression hose to help prevent  further swelling and edema to help prevent further rash.  She can use the steroids as needed.  Consider Lasix if worsening.  Using sterile technique, I injected the right knee with 2 cc lidocaine, 2 cc of Marcaine, and 2 cc of 40 mg/mL Kenalog.

## 2021-12-27 ENCOUNTER — Telehealth: Payer: Self-pay | Admitting: *Deleted

## 2021-12-27 NOTE — Chronic Care Management (AMB) (Signed)
°  Care Management   Note  12/27/2021 Name: [name] MRN: [id number] DOB: September 19, 1946  [name] is a 76 y.o. year old female who is a primary care patient of Donita Brooks, MD and is actively engaged with the care management team. I reached out to Arloa Koh by phone today to assist with re-scheduling a follow up visit with the RN Case Manager  Follow up plan: Unsuccessful telephone outreach attempt made. A HIPAA compliant phone message was left for the patient providing contact information and requesting a return call.  The care management team will reach out to the patient again over the next 7 days.  If patient returns call to provider office, please advise to call Embedded Care Management Care Guide Misty Stanley at [phone].  Gwenevere Ghazi  Care Guide, Embedded Care Coordination [facility] Management  Direct Dial: [phone]

## 2021-12-28 NOTE — Chronic Care Management (AMB) (Signed)
°  Care Management   Note  12/28/2021 Name: SISTER SCHLAGETER MRN: [id number] DOB: [birth year]  CHERYSH LUTEN is a 76 y.o. year old female who is a primary care patient of Susy Frizzle, MD and is actively engaged with the care management team. I reached out to [name] by phone today to assist with re-scheduling a follow up visit with the RN Case Manager  Follow up plan: Telephone appointment with care management team member scheduled for:02/24/22 at [place], [place] [place] [facility]  Direct Dial: [phone]
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
## 2022-01-13 ENCOUNTER — Emergency Department (HOSPITAL_COMMUNITY)
Admission: EM | Admit: 2022-01-13 | Discharge: 2022-01-13 | Disposition: A | Discharge status: Home / Self Care | Payer: Medicare Other | Attending: Emergency Medicine | Admitting: Emergency Medicine

## 2022-01-13 ENCOUNTER — Encounter (HOSPITAL_COMMUNITY): Payer: Self-pay

## 2022-01-13 ENCOUNTER — Emergency Department (HOSPITAL_COMMUNITY): Payer: Medicare Other

## 2022-01-13 ENCOUNTER — Ambulatory Visit: Payer: Self-pay | Admitting: Adult Health

## 2022-01-13 ENCOUNTER — Other Ambulatory Visit: Payer: Self-pay

## 2022-01-13 DIAGNOSIS — W19XXXA Unspecified fall, initial encounter: Secondary | ICD-10-CM | POA: Diagnosis not present

## 2022-01-13 DIAGNOSIS — T1490XA Injury, unspecified, initial encounter: Secondary | ICD-10-CM

## 2022-01-13 DIAGNOSIS — F10221 Alcohol dependence with intoxication delirium: Secondary | ICD-10-CM | POA: Insufficient documentation

## 2022-01-13 DIAGNOSIS — Y92002 Bathroom of unspecified non-institutional (private) residence single-family (private) house as the place of occurrence of the external cause: Secondary | ICD-10-CM | POA: Insufficient documentation

## 2022-01-13 DIAGNOSIS — R4182 Altered mental status, unspecified: Secondary | ICD-10-CM | POA: Diagnosis present

## 2022-01-13 DIAGNOSIS — S0990XA Unspecified injury of head, initial encounter: Secondary | ICD-10-CM | POA: Diagnosis not present

## 2022-01-13 DIAGNOSIS — Z7901 Long term (current) use of anticoagulants: Secondary | ICD-10-CM | POA: Insufficient documentation

## 2022-01-13 DIAGNOSIS — S3991XA Unspecified injury of abdomen, initial encounter: Secondary | ICD-10-CM | POA: Diagnosis not present

## 2022-01-13 DIAGNOSIS — S80211A Abrasion, right knee, initial encounter: Secondary | ICD-10-CM | POA: Diagnosis not present

## 2022-01-13 DIAGNOSIS — R7309 Other abnormal glucose: Secondary | ICD-10-CM | POA: Diagnosis not present

## 2022-01-13 DIAGNOSIS — M25562 Pain in left knee: Secondary | ICD-10-CM | POA: Insufficient documentation

## 2022-01-13 DIAGNOSIS — Z20822 Contact with and (suspected) exposure to covid-19: Secondary | ICD-10-CM | POA: Insufficient documentation

## 2022-01-13 DIAGNOSIS — K5790 Diverticulosis of intestine, part unspecified, without perforation or abscess without bleeding: Secondary | ICD-10-CM | POA: Diagnosis not present

## 2022-01-13 DIAGNOSIS — Y908 Blood alcohol level of 240 mg/100 ml or more: Secondary | ICD-10-CM | POA: Diagnosis not present

## 2022-01-13 DIAGNOSIS — R55 Syncope and collapse: Secondary | ICD-10-CM | POA: Diagnosis not present

## 2022-01-13 DIAGNOSIS — I700 Atherosclerosis of aorta: Secondary | ICD-10-CM | POA: Insufficient documentation

## 2022-01-13 DIAGNOSIS — S299XXA Unspecified injury of thorax, initial encounter: Secondary | ICD-10-CM | POA: Diagnosis not present

## 2022-01-13 DIAGNOSIS — F10921 Alcohol use, unspecified with intoxication delirium: Secondary | ICD-10-CM

## 2022-01-13 DIAGNOSIS — R4020 Unspecified coma: Secondary | ICD-10-CM | POA: Diagnosis not present

## 2022-01-13 DIAGNOSIS — I10 Essential (primary) hypertension: Secondary | ICD-10-CM | POA: Diagnosis not present

## 2022-01-13 DIAGNOSIS — S8991XA Unspecified injury of right lower leg, initial encounter: Secondary | ICD-10-CM | POA: Diagnosis not present

## 2022-01-13 DIAGNOSIS — R531 Weakness: Secondary | ICD-10-CM | POA: Diagnosis not present

## 2022-01-13 DIAGNOSIS — S3993XA Unspecified injury of pelvis, initial encounter: Secondary | ICD-10-CM | POA: Diagnosis not present

## 2022-01-13 LAB — I-STAT CHEM 8, ED
BUN: 33 mg/dL — ABNORMAL HIGH (ref 8–23)
Calcium, Ion: 1.16 mmol/L (ref 1.15–1.40)
Chloride: 114 mmol/L — ABNORMAL HIGH (ref 98–111)
Creatinine, Ser: 2.1 mg/dL — ABNORMAL HIGH (ref 0.44–1.00)
Glucose, Bld: 115 mg/dL — ABNORMAL HIGH (ref 70–99)
HCT: 42 % (ref 36.0–46.0)
Hemoglobin: 14.3 g/dL (ref 12.0–15.0)
Potassium: 3.8 mmol/L (ref 3.5–5.1)
Sodium: 146 mmol/L — ABNORMAL HIGH (ref 135–145)
TCO2: 21 mmol/L — ABNORMAL LOW (ref 22–32)

## 2022-01-13 LAB — CBC
HCT: 39.5 % (ref 36.0–46.0)
Hemoglobin: 12.7 g/dL (ref 12.0–15.0)
MCH: 29.5 pg (ref 26.0–34.0)
MCHC: 32.2 g/dL (ref 30.0–36.0)
MCV: 91.6 fL (ref 80.0–100.0)
Platelets: 195 10*3/uL (ref 150–400)
RBC: 4.31 MIL/uL (ref 3.87–5.11)
RDW: 15.5 % (ref 11.5–15.5)
WBC: 4.8 10*3/uL (ref 4.0–10.5)
nRBC: 0 % (ref 0.0–0.2)

## 2022-01-13 LAB — COMPREHENSIVE METABOLIC PANEL
ALT: 24 U/L (ref 0–44)
AST: 22 U/L (ref 15–41)
Albumin: 4.2 g/dL (ref 3.5–5.0)
Alkaline Phosphatase: 59 U/L (ref 38–126)
Anion gap: 14 (ref 5–15)
BUN: 31 mg/dL — ABNORMAL HIGH (ref 8–23)
CO2: 20 mmol/L — ABNORMAL LOW (ref 22–32)
Calcium: 9.6 mg/dL (ref 8.9–10.3)
Chloride: 111 mmol/L (ref 98–111)
Creatinine, Ser: 1.67 mg/dL — ABNORMAL HIGH (ref 0.44–1.00)
GFR, Estimated: 32 mL/min — ABNORMAL LOW (ref >60)
Glucose, Bld: 113 mg/dL — ABNORMAL HIGH (ref 70–99)
Potassium: 3.7 mmol/L (ref 3.5–5.1)
Sodium: 145 mmol/L (ref 135–145)
Total Bilirubin: 0.7 mg/dL (ref 0.3–1.2)
Total Protein: 7.6 g/dL (ref 6.5–8.1)

## 2022-01-13 LAB — URINALYSIS, ROUTINE W REFLEX MICROSCOPIC
Bacteria, UA: NONE SEEN
Bilirubin Urine: NEGATIVE
Glucose, UA: NEGATIVE mg/dL
Ketones, ur: NEGATIVE mg/dL
Leukocytes,Ua: NEGATIVE
Nitrite: NEGATIVE
Protein, ur: 100 mg/dL — AB
Specific Gravity, Urine: 1.028 (ref 1.005–1.030)
pH: 5 (ref 5.0–8.0)

## 2022-01-13 LAB — RESP PANEL BY RT-PCR (FLU A&B, COVID) ARPGX2
Influenza A by PCR: NEGATIVE
Influenza B by PCR: NEGATIVE
SARS Coronavirus 2 by RT PCR: NEGATIVE

## 2022-01-13 LAB — PROTIME-INR
INR: 0.9 (ref 0.8–1.2)
Prothrombin Time: 12.2 seconds (ref 11.4–15.2)

## 2022-01-13 LAB — TROPONIN I (HIGH SENSITIVITY)
Troponin I (High Sensitivity): 17 ng/L (ref <18)
Troponin I (High Sensitivity): 17 ng/L (ref <18)

## 2022-01-13 LAB — CBG MONITORING, ED: Glucose-Capillary: 105 mg/dL — ABNORMAL HIGH (ref 70–99)

## 2022-01-13 LAB — ETHANOL: Alcohol, Ethyl (B): 381 mg/dL (ref <10)

## 2022-01-13 LAB — SAMPLE TO BLOOD BANK

## 2022-01-13 LAB — LACTIC ACID, PLASMA: Lactic Acid, Venous: 3.5 mmol/L (ref 0.5–1.9)

## 2022-01-13 MED ORDER — IOHEXOL 300 MG/ML  SOLN
80.0000 mL | Freq: Once | INTRAMUSCULAR | Status: AC | PRN
  Administered 2022-01-13: 80 mL via INTRAVENOUS

## 2022-01-13 MED ORDER — SODIUM CHLORIDE 0.9 % IV BOLUS
500.0000 mL | Freq: Once | INTRAVENOUS | Status: AC
  Administered 2022-01-13: 500 mL via INTRAVENOUS

## 2022-01-13 NOTE — ED Notes (Signed)
Pt transported to CT ?

## 2022-01-13 NOTE — ED Notes (Signed)
Assumed pt care at this time

## 2022-01-13 NOTE — ED Provider Notes (Signed)
[facility] EMERGENCY DEPARTMENT Provider Note   CSN: [id number] Arrival date & time: 01/13/22  [id number]     History  Chief Complaint  Patient presents with   Level 2 Christine Hogan is a 76 y.o. female.  Patient brought to the emergency department from home as a level 1 trauma.  Family called 911 when they heard her fall in the bathroom.  Patient was initially unresponsive.  EMS report that she responded to painful stimuli initially but by the time they arrived at the ER, she is awake, alert and answering questions.  Patient without complaints currently other than feeling "weak all over".       Home Medications Prior to Admission medications   Not on File      Allergies    Codeine    Review of Systems   Review of Systems  Neurological:  Positive for syncope and weakness.   Physical Exam Updated Vital Signs BP 131/60    Pulse 77    Temp (!) 97.4 F (36.3 C) (Temporal)    Resp 18    Ht 5\' 5"  (1.651 m)    Wt 72.6 kg    SpO2 99%    BMI 26.63 kg/m  Physical Exam Vitals and nursing note reviewed.  Constitutional:      General: She is not in acute distress.    Appearance: She is well-developed.  HENT:     Head: Normocephalic and atraumatic.     Mouth/Throat:     Mouth: Mucous membranes are moist.  Eyes:     General: Vision grossly intact. Gaze aligned appropriately.     Extraocular Movements: Extraocular movements intact.     Conjunctiva/sclera: Conjunctivae normal.  Cardiovascular:     Rate and Rhythm: Normal rate and regular rhythm.     Pulses: Normal pulses.     Heart sounds: Normal heart sounds, S1 normal and S2 normal. No murmur heard.   No friction rub. No gallop.  Pulmonary:     Effort: Pulmonary effort is normal. No respiratory distress.     Breath sounds: Normal breath sounds.  Abdominal:     General: Bowel sounds are normal.     Palpations: Abdomen is soft.     Tenderness: There is no abdominal tenderness. There is no guarding  or rebound.     Hernia: No hernia is present.  Musculoskeletal:        General: No swelling.     Cervical back: Full passive range of motion without pain, normal range of motion and neck supple. No spinous process tenderness or muscular tenderness. Normal range of motion.     Left knee: Tenderness present.     Right lower leg: No edema.     Left lower leg: No edema.  Skin:    General: Skin is warm and dry.     Capillary Refill: Capillary refill takes less than 2 seconds.     Findings: Abrasion (R knee) present. No ecchymosis, erythema, rash or wound.  Neurological:     General: No focal deficit present.     Mental Status: She is alert and oriented to person, place, and time.     GCS: GCS eye subscore is 4. GCS verbal subscore is 5. GCS motor subscore is 6.     Cranial Nerves: Cranial nerves 2-12 are intact.     Sensory: Sensation is intact.     Motor: Motor function is intact.     Coordination: Coordination is  intact.  Psychiatric:        Attention and Perception: Attention normal.        Mood and Affect: Mood normal.        Speech: Speech normal.        Behavior: Behavior normal.    ED Results / Procedures / Treatments   Labs (all labs ordered are listed, but only abnormal results are displayed) Labs Reviewed  COMPREHENSIVE METABOLIC PANEL - Abnormal; Notable for the following components:      Result Value   CO2 20 (*)    Glucose, Bld 113 (*)    BUN 31 (*)    Creatinine, Ser 1.67 (*)    GFR, Estimated 32 (*)    All other components within normal limits  ETHANOL - Abnormal; Notable for the following components:   Alcohol, Ethyl (B) 381 (*)    All other components within normal limits  URINALYSIS, ROUTINE W REFLEX MICROSCOPIC - Abnormal; Notable for the following components:   Hgb urine dipstick MODERATE (*)    Protein, ur 100 (*)    All other components within normal limits  LACTIC ACID, PLASMA - Abnormal; Notable for the following components:   Lactic Acid, Venous 3.5  (*)    All other components within normal limits  I-STAT CHEM 8, ED - Abnormal; Notable for the following components:   Sodium 146 (*)    Chloride 114 (*)    BUN 33 (*)    Creatinine, Ser 2.10 (*)    Glucose, Bld 115 (*)    TCO2 21 (*)    All other components within normal limits  CBG MONITORING, ED - Abnormal; Notable for the following components:   Glucose-Capillary 105 (*)    All other components within normal limits  RESP PANEL BY RT-PCR (FLU A&B, COVID) ARPGX2  CBC  PROTIME-INR  SAMPLE TO BLOOD BANK  TROPONIN I (HIGH SENSITIVITY)  TROPONIN I (HIGH SENSITIVITY)    EKG None  Radiology DG Tibia/Fibula Right  Result Date: 01/13/2022 CLINICAL DATA:  Trauma due to fall. EXAM: RIGHT TIBIA AND FIBULA - 1 VIEW COMPARISON:  None. FINDINGS: Per report, clinician requested one-view. The medial malleolus is not completely covered. No evidence of fracture or subluxation. Advanced degenerative spurring at the knee. Generalized osteopenia IMPRESSION: 1. One-view study which is negative for acute finding. 2. Advanced osteoarthritis at the knee. Electronically Signed   By: Jorje Guild M.D.   On: 01/13/2022 04:24   CT HEAD WO CONTRAST  Result Date: 01/13/2022 CLINICAL DATA:  Minor head trauma EXAM: CT HEAD WITHOUT CONTRAST CT CERVICAL SPINE WITHOUT CONTRAST TECHNIQUE: Multidetector CT imaging of the head and cervical spine was performed following the standard protocol without intravenous contrast. Multiplanar CT image reconstructions of the cervical spine were also generated. RADIATION DOSE REDUCTION: This exam was performed according to the departmental dose-optimization program which includes automated exposure control, adjustment of the mA and/or kV according to patient size and/or use of iterative reconstruction technique. COMPARISON:  09/12/2020 FINDINGS: CT HEAD FINDINGS Brain: No evidence of acute infarction, hemorrhage, hydrocephalus, extra-axial collection or mass lesion/mass effect.  Age normal brain volume. Mild chronic small vessel ischemia. Vascular: No hyperdense vessel or unexpected calcification. Skull: Negative for fracture Sinuses/Orbits: Negative CT CERVICAL SPINE FINDINGS Alignment: Normal. Skull base and vertebrae: No acute fracture. No primary bone lesion or focal pathologic process. Soft tissues and spinal canal: No prevertebral fluid or swelling. No visible canal hematoma. Disc levels: Cervical spine degeneration primarily affecting disc spaces with narrowing  and ridging. Upper chest: Negative IMPRESSION: No evidence of intracranial or cervical spine injury. Electronically Signed   By: Jorje Guild M.D.   On: 01/13/2022 04:39   CT CERVICAL SPINE WO CONTRAST  Result Date: 01/13/2022 CLINICAL DATA:  Minor head trauma EXAM: CT HEAD WITHOUT CONTRAST CT CERVICAL SPINE WITHOUT CONTRAST TECHNIQUE: Multidetector CT imaging of the head and cervical spine was performed following the standard protocol without intravenous contrast. Multiplanar CT image reconstructions of the cervical spine were also generated. RADIATION DOSE REDUCTION: This exam was performed according to the departmental dose-optimization program which includes automated exposure control, adjustment of the mA and/or kV according to patient size and/or use of iterative reconstruction technique. COMPARISON:  09/12/2020 FINDINGS: CT HEAD FINDINGS Brain: No evidence of acute infarction, hemorrhage, hydrocephalus, extra-axial collection or mass lesion/mass effect. Age normal brain volume. Mild chronic small vessel ischemia. Vascular: No hyperdense vessel or unexpected calcification. Skull: Negative for fracture Sinuses/Orbits: Negative CT CERVICAL SPINE FINDINGS Alignment: Normal. Skull base and vertebrae: No acute fracture. No primary bone lesion or focal pathologic process. Soft tissues and spinal canal: No prevertebral fluid or swelling. No visible canal hematoma. Disc levels: Cervical spine degeneration primarily  affecting disc spaces with narrowing and ridging. Upper chest: Negative IMPRESSION: No evidence of intracranial or cervical spine injury. Electronically Signed   By: Jorje Guild M.D.   On: 01/13/2022 04:39   DG Pelvis Portable  Result Date: 01/13/2022 CLINICAL DATA:  Trauma.  Fall. EXAM: PORTABLE PELVIS 1 VIEWS COMPARISON:  None available FINDINGS: There is no evidence of pelvic fracture or diastasis. No pelvic bone lesions are seen. Bilateral enthesophytes. Pelvic phleboliths. IMPRESSION: No visible injury. Electronically Signed   By: Jorje Guild M.D.   On: 01/13/2022 04:14   CT CHEST ABDOMEN PELVIS W CONTRAST  Result Date: 01/13/2022 CLINICAL DATA:  Blunt poly trauma EXAM: CT CHEST, ABDOMEN, AND PELVIS WITH CONTRAST TECHNIQUE: Multidetector CT imaging of the chest, abdomen and pelvis was performed following the standard protocol during bolus administration of intravenous contrast. RADIATION DOSE REDUCTION: This exam was performed according to the departmental dose-optimization program which includes automated exposure control, adjustment of the mA and/or kV according to patient size and/or use of iterative reconstruction technique. CONTRAST:  67mL OMNIPAQUE IOHEXOL 300 MG/ML  SOLN COMPARISON:  Abdomen and pelvis CT 03/31/2015 FINDINGS: CT CHEST FINDINGS Cardiovascular: Cardiomegaly, especially the left atrium with pulmonary vein enlargement. Atheromatous calcification of the aorta and coronaries. No pericardial effusion. No evidence of great vessel injury. Mediastinum/Nodes: No hematoma or pneumomediastinum. Moderate sliding hiatal hernia. Lungs/Pleura: The central airways are clear. No hemothorax, pneumothorax, or lung contusion. Mild subpleural reticulation with low volume suggesting this is primarily atelectasis, although some fibrotic changes seen and at the left base. Musculoskeletal: No acute finding CT ABDOMEN PELVIS FINDINGS Hepatobiliary: No hepatic injury or perihepatic hematoma.  Gallbladder is unremarkable. Cystic densities in the right lobe liver. Pancreas: Generalized fatty infiltration.  No acute finding Spleen: No splenic injury or perisplenic hematoma. Adrenals/Urinary Tract: No adrenal hemorrhage or renal injury identified. Bladder is unremarkable. Stomach/Bowel: Hiatal hernia is noted above. Innumerable colonic diverticula. Uncomplicated although large distal duodenal diverticula. No evidence of bowel injury Vascular/Lymphatic: Atheromatous calcification of the aorta and branch vessels. No acute finding Reproductive: Hysterectomy Other: No ascites or pneumoperitoneum. Musculoskeletal: Ordinary lumbar spine degeneration. No acute fracture or subluxation. IMPRESSION: 1. No evidence of intrathoracic or intra-abdominal injury. 2. Cardiomegaly. 3. Extensive diverticulosis. 4.  Aortic Atherosclerosis (ICD10-I70.0). Electronically Signed   By: Gilford Silvius.D.  On: 01/13/2022 04:49   DG Chest Port 1 View  Result Date: 01/13/2022 CLINICAL DATA:  Fall with trauma. EXAM: PORTABLE CHEST 1 VIEW COMPARISON:  01/06/2020 FINDINGS: Cardiomegaly which is chronic. Hilar vascular fullness which is stable. There is no edema, consolidation, effusion, or pneumothorax. Artifact from EKG leads. IMPRESSION: Stable from 2021.  No acute finding. Cardiomegaly. Electronically Signed   By: Jorje Guild M.D.   On: 01/13/2022 04:13    Procedures Procedures    Medications Ordered in ED Medications  iohexol (OMNIPAQUE) 300 MG/ML solution 80 mL (80 mLs Intravenous Contrast Given 01/13/22 0423)  sodium chloride 0.9 % bolus 500 mL (500 mLs Intravenous New Bag/Given 01/13/22 0548)    ED Course/ Medical Decision Making/ A&P                           Medical Decision Making Amount and/or Complexity of Data Reviewed Labs: ordered. Radiology: ordered.  Risk Prescription drug management.   Arrives as a level 1 trauma.  She was initially leveled because of unresponsiveness after a fall and she  does take blood thinners.  When it was noted she was awake, alert at arrival, patient was downgraded.  Differential diagnosis considered includes spontaneous intracranial bleed, traumatic intracranial bleed, seizure, stroke, TIA, arrhythmia.  Patient was awake but speech was a little slurred at arrival.  No focal deficits noted.  Downgraded to a level 2 trauma.  CT head and cervical spine did not show any acute abnormality.  She did have an abrasion on her right tib-fib area with some tenderness but x-ray was negative.   Ultimately patient was found to have an alcohol level of 381.  She admits to drinking heavily last night.  This is the cause of her fall, altered mental status.  She will be observed in the department until more sober.  Anticipate discharge.  Will sign out to oncoming ER physician to follow-up progress.        Final Clinical Impression(s) / ED Diagnoses Final diagnoses:  Trauma  Alcohol intoxication with delirium [facility])    Rx / DC Orders ED Discharge Orders     None         Rogen Porte, Gwenyth Allegra, MD 01/13/22 0725

## 2022-01-13 NOTE — ED Notes (Signed)
Reviewed discharge instructions with patient and daughter. Follow-up care reviewed. Patient and daughter verbalized understanding. Patient A&Ox4, VSS, and ambulatory with steady gait upon discharge.  

## 2022-01-13 NOTE — ED Notes (Signed)
Pt arrives via EMS with c/o fall. Pt on Eliquis. Pt family heard "thud" and found pt on the floor. EMS states on arrival pt slow to respond. Pt A+O upon arrival GCS 15.  ?

## 2022-01-13 NOTE — ED Notes (Signed)
Spoke with EDP about 2nd LA. EDP states that a second one is not necessary ?

## 2022-01-13 NOTE — ED Provider Notes (Signed)
Patient's daughter is at bedside.  Per nursing patient is ready to go, daughter is comfortable taking patient home. ?  ?Gerhard Munch, MD ?01/13/22 [phone] ? ?

## 2022-01-13 NOTE — Progress Notes (Addendum)
Orthopedic Tech Progress Note ?Patient Details:  ?Christine Hogan ?[birth year] ?[id number] ? ?Level 1 trauma, downgraded to level 2.  ? ?Patient ID: Christine Hogan, female   DOB: Apr 05, 1946, 76 y.o.   MRN: [id number] ? ?[name] ?01/13/2022, 4:00 AM ? ?

## 2022-01-13 NOTE — ED Notes (Signed)
Pt up to bedside commode. Pt stable on her feet and talking more ?

## 2022-01-13 NOTE — ED Notes (Signed)
Trauma Response Nurse Documentation ? ? ?Christine Hogan is a 76 y.o. female arriving to [facility] ED via EMS ? ?On Eliquis (apixaban) daily. Trauma was activated as a Level 1 by ED charge nurse based on the following trauma criteria GCS < 9. Downgraded to a level 2 immediately after arrival for GCS 15. Trauma MD and TRN at the bedside on patient arrival. Patient cleared for CT by Dr. Blinda Leatherwood. Patient to CT with team. GCS 15. ? ?History  ? History reviewed. No pertinent past medical history.  ? History reviewed. No pertinent surgical history.  ?Per patient's other chart - hx afib on thinners, alcohol abuse, CKD, HTN, diastolic dysfunction ? ? ?Initial Focused Assessment (If applicable, or please see trauma documentation): ?EMS c-collar in place, hematoma to forehead, deformity right knee ? ?CT's Completed:   ?CT Head, CT C-Spine, CT Chest w/ contrast, and CT abdomen/pelvis w/ contrast  ? ?Interventions:  ?CTs as above ?XRAY chest and pelvis, right tib/fib ?Trauma lab draw and IV start ?COVID swab ?Knee immobilizer right knee ?CAGEAID ? ?Plan for disposition:  ?Pending imaging results at this time  ?Sober ? ?Consults completed:  ?none at the time of this note. ? ?Event Summary: ?Patient arrives via EMS after a fall at home, per EMS initially unresponsive but mental status improved prior to arrival. Arrives with GCS 15. Eliquis for hx AFIB. Hematoma to forehead, deformity to right knee. Patient unable to report events of fall. When asked about alcohol consumption, she states "too much" and refusing to elaborate. Ethanol level 381. Initial scans negative, anticipate discharge after sober. ? ?MTP Summary (If applicable): NA ? ?Bedside handoff with ED RN Christine Hogan.   ? ?Christine Hogan O Christine Hogan  ?Trauma Response RN ? ?Please call TRN at [phone] for further assistance. ?  ?

## 2022-01-13 NOTE — TOC CAGE-AID Note (Signed)
Transition of Care (TOC) - CAGE-AID Screening ? ? ?Patient Details  ?Name: Christine Hogan ?MRN: [id number] ?Date of Birth: [birth year] ? ?Transition of Care (TOC) CM/SW Contact:    ?Katha Hamming, RN ?Phone Number: ?01/13/2022, 4:55 AM ? ? ?Clinical Narrative: ? ?Ethanol 381. Would benefit from [employer] consult if admitted ? ?CAGE-AID Screening: ?  ? ?Have You Ever Felt You Ought to Cut Down on Your Drinking or Drug Use?: Yes ?Have People Annoyed You By Critizing Your Drinking Or Drug Use?: Yes ?Have You Felt Bad Or Guilty About Your Drinking Or Drug Use?: Yes ?Have You Ever Had a Drink or Used Drugs First Thing In The Morning to Steady Your Nerves or to Get Rid of a Hangover?: Yes ?CAGE-AID Score: 4 ? ?Substance Abuse Education Offered: Yes ? ?Substance abuse interventions: Educational Materials ? ? ? ? ? ? ?

## 2022-01-13 NOTE — ED Notes (Signed)
CRITICAL VALUE STICKER ? ?CRITICAL VALUE:etoh 331/ LA 3.5 ? ?RECEIVER (on-site recipient of call):Lattie Riege  ?01/13/22 0424 ?DATE & TIME NOTIFIED:  ? ?MESSENGER (representative from lab):amanda ? ?MD NOTIFIED: pollina ? ?TIME OF NOTIFICATION:0424 ? ?RESPONSE:   ?

## 2022-01-13 NOTE — ED Triage Notes (Signed)
See Trauma narrator. 

## 2022-01-13 NOTE — Progress Notes (Signed)
Chaplain responded to this Level I that was downgraded to a Level II.  Patient continues to be evaluated.  EMT indicated family was present at the home and were on the way.  Chaplain let front desk know of family on the way. Chaplain available as needed for patient and or family support. ?Chaplain Agustin Cree, [place]. ? ? ? 01/13/22 0358  ?Clinical Encounter Type  ?Visited With Health care provider;Patient not available  ?Visit Type Trauma;ED  ?Referral From Nurse  ?Consult/Referral To Chaplain  ? ? ?

## 2022-01-13 NOTE — Discharge Instructions (Addendum)
Please be sure to follow-up with your physician as needed.  Drink alcohol only in moderation. ?

## 2022-01-18 ENCOUNTER — Encounter: Payer: Self-pay | Admitting: Adult Health

## 2022-01-19 ENCOUNTER — Other Ambulatory Visit: Payer: Self-pay

## 2022-01-19 ENCOUNTER — Ambulatory Visit (INDEPENDENT_AMBULATORY_CARE_PROVIDER_SITE_OTHER): Payer: Medicare Other | Admitting: Family Medicine

## 2022-01-19 VITALS — BP 132/78 | HR 59 | Temp 96.6°F | Resp 18 | Ht 65.0 in | Wt 153.0 lb

## 2022-01-19 DIAGNOSIS — R1084 Generalized abdominal pain: Secondary | ICD-10-CM | POA: Diagnosis not present

## 2022-01-19 NOTE — Progress Notes (Signed)
? ?Subjective:  ? ? Patient ID: Christine Hogan, female    DOB: 1946/03/31, 76 y.o.   MRN: [id number] ? ?Abdominal Pain ? ?Patient went to the emergency room March 10 due to syncope.  Work-up in the emergency room was unremarkable aside from an elevated creatinine of 2.1, baseline is 1.6.  CT of the chest abdomen and pelvis showed fatty infiltration of the pancreas, coronary artery calcifications, but was otherwise unremarkable.  Patient has been abstinent from alcohol since that time however she continues to have epigastric and lower abdominal pain.  She states that she gets sick and has pain when she eats.  She also reports decreased frequency of bowel movements.  She denies any nausea or vomiting.  She denies any hematemesis.  She denies any melena or hematochezia.  Today on exam, her abdomen is soft nondistended with mild tenderness to palpation in the epigastric area and in the suprapubic area.  She denies any dysuria urgency or frequency.  She does report some constipation. ?Past Medical History:  ?Diagnosis Date  ? Arthritis   ? Atrial fibrillation ([place])   ? Chest pain   ? a. 2014: NST with no evidence of ischemia  ? CKD (chronic kidney disease) stage 3, GFR 30-59 ml/min (HCC)   ? Diastolic dysfunction   ? grade 2 with dyspnea on exertion  ? H/O echocardiogram   ? a. 2014: echo showing EF of 65-70% with Grade 1 DD, moderate TR, and mild to moderate MR  ? Hyperlipidemia   ? Hypertension   ? Near syncope   ? Osteopenia   ? ?Past Surgical History:  ?Procedure Laterality Date  ? ABDOMINAL HYSTERECTOMY    ? TAH/BSO (menorrhagia)  ? bunion repair    ? HERNIA REPAIR    ? ?Current Outpatient Medications on File Prior to Visit  ?Medication Sig Dispense Refill  ? amLODipine (NORVASC) 10 MG tablet TAKE 1 TABLET BY MOUTH  DAILY 90 tablet 3  ? amLODipine (NORVASC) 10 MG tablet Take 10 mg by mouth daily.    ? ELIQUIS 5 MG TABS tablet TAKE 1 TABLET BY MOUTH  TWICE DAILY 180 tablet 3  ? ELIQUIS 5 MG TABS tablet Take 5 mg by  mouth 2 (two) times daily.    ? hydrochlorothiazide (HYDRODIURIL) 12.5 MG tablet TAKE 1 TABLET BY MOUTH EVERY DAY 90 tablet 0  ? hydrochlorothiazide (HYDRODIURIL) 12.5 MG tablet Take 12.5 mg by mouth daily.    ? losartan (COZAAR) 100 MG tablet Take 1 tablet (100 mg total) by mouth daily. 90 tablet 0  ? methocarbamol (ROBAXIN) 500 MG tablet Take 1 tablet (500 mg total) by mouth 3 (three) times daily. 10 tablet 0  ? metoprolol tartrate (LOPRESSOR) 50 MG tablet TAKE 1 TABLET BY MOUTH TWICE A DAY 180 tablet 0  ? metoprolol tartrate (LOPRESSOR) 50 MG tablet Take 50 mg by mouth 2 (two) times daily.    ? Multiple Vitamins-Minerals (CENTRUM SILVER 50+WOMEN) TABS Take 1 tablet by mouth daily with breakfast.    ? nitroGLYCERIN (NITROSTAT) 0.4 MG SL tablet Place 1 tablet (0.4 mg total) under the tongue every 5 (five) minutes as needed for chest pain. 20 tablet 0  ? rosuvastatin (CRESTOR) 10 MG tablet TAKE 1 TABLET BY MOUTH ONCE DAILY 90 tablet 3  ? rosuvastatin (CRESTOR) 10 MG tablet Take 10 mg by mouth at bedtime.    ? triamcinolone ointment (KENALOG) 0.1 % Apply 1 application topically 2 (two) times daily. Apply small amount twice daily for  7 days; notify prescriber if symptoms not improved after 7 days 30 g 0  ? triamcinolone ointment (KENALOG) 0.1 % Apply 1 application. topically 2 (two) times daily.    ? ?No current facility-administered medications on file prior to visit.  ? ?Allergies  ?Allergen Reactions  ? Codeine Nausea And Vomiting  ? Codeine Nausea And Vomiting and Other (See Comments)  ?  Dizziness, also  ? ?Social History  ? ?Socioeconomic History  ? Marital status: Divorced  ?  Spouse name: Not on file  ? Number of children: 5  ? Years of education: 12th grade  ? Highest education level: Not on file  ?Occupational History  ? Occupation: Retired  ?Tobacco Use  ? Smoking status: Never  ? Smokeless tobacco: Never  ?Vaping Use  ? Vaping Use: Never used  ?Substance and Sexual Activity  ? Alcohol use: Yes  ?  Comment:  when asked how much states "too much"  ? Drug use: No  ? Sexual activity: Yes  ?Other Topics Concern  ? Not on file  ?Social History Narrative  ? ** Merged History Encounter **  ?    ? Lives with her son. ?Right-handed. ?Four living children. ?Occasional caffeine use.  ? ?Social Determinants of Health  ? ?Financial Resource Strain: Low Risk   ? Difficulty of Paying Living Expenses: Not hard at all  ?Food Insecurity: No Food Insecurity  ? Worried About Programme researcher, broadcasting/film/video in the Last Year: Never true  ? Ran Out of Food in the Last Year: Never true  ?Transportation Needs: No Transportation Needs  ? Lack of Transportation (Medical): No  ? Lack of Transportation (Non-Medical): No  ?Physical Activity: Inactive  ? Days of Exercise per Week: 0 days  ? Minutes of Exercise per Session: 0 min  ?Stress: No Stress Concern Present  ? Feeling of Stress : Not at all  ?Social Connections: Moderately Isolated  ? Frequency of Communication with Friends and Family: More than three times a week  ? Frequency of Social Gatherings with Friends and Family: More than three times a week  ? Attends Religious Services: More than 4 times per year  ? Active Member of Clubs or Organizations: No  ? Attends Banker Meetings: Never  ? Marital Status: Divorced  ?Intimate Partner Violence: Not At Risk  ? Fear of Current or Ex-Partner: No  ? Emotionally Abused: No  ? Physically Abused: No  ? Sexually Abused: No  ? ? ? ?Review of Systems  ?Gastrointestinal:  Positive for abdominal pain.  ?All other systems reviewed and are negative. ? ?   ?Objective:  ? Physical Exam ?Vitals reviewed.  ?Constitutional:   ?   General: She is not in acute distress. ?   Appearance: Normal appearance. She is normal weight. She is not ill-appearing, toxic-appearing or diaphoretic.  ?Eyes:  ?   Extraocular Movements: Extraocular movements intact.  ?   Pupils: Pupils are equal, round, and reactive to light.  ?Neck:  ?   Vascular: No carotid bruit.   ?Cardiovascular:  ?   Rate and Rhythm: Normal rate and regular rhythm.  ?   Pulses: Normal pulses.  ?   Heart sounds: Normal heart sounds. No murmur heard. ?  No friction rub. No gallop.  ?Pulmonary:  ?   Effort: Pulmonary effort is normal. No respiratory distress.  ?   Breath sounds: Normal breath sounds. No stridor. No wheezing, rhonchi or rales.  ?Abdominal:  ?   General: Abdomen is  flat. Bowel sounds are decreased. There is no distension.  ?   Palpations: Abdomen is soft.  ?   Tenderness: There is generalized abdominal tenderness. There is no guarding or rebound.  ?   Hernia: No hernia is present.  ? ? ?Neurological:  ?   General: No focal deficit present.  ?   Mental Status: She is alert and oriented to person, place, and time. Mental status is at baseline.  ?   Cranial Nerves: No cranial nerve deficit.  ?   Sensory: No sensory deficit.  ?   Motor: No weakness.  ?   Coordination: Coordination normal.  ?   Gait: Gait normal.  ?   Deep Tendon Reflexes: Reflexes normal.  ?Psychiatric:     ?   Mood and Affect: Mood normal.     ?   Behavior: Behavior normal.     ?   Thought Content: Thought content normal.     ?   Judgment: Judgment normal.  ? ? ? ? ? ?   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  The other possibility would be the constipation the patient is complaining of.  She is now abstinent from alcohol.  We will try Linzess 145 mcg p.o. daily.  I will check a CBC CMP and lipase.  Recheck Monday if no better or sooner if worsening.  Neck step would be to try the patient on pancreatic enzymes.  I reviewed the CT scan of the abdomen and pelvis from March 10 and there were no acute findings in the abdomen ?

## 2022-01-20 LAB — COMPLETE METABOLIC PANEL WITH GFR
AG Ratio: 1.5 (calc) (ref 1.0–2.5)
ALT: 20 U/L (ref 6–29)
AST: 22 U/L (ref 10–35)
Albumin: 4.4 g/dL (ref 3.6–5.1)
Alkaline phosphatase (APISO): 56 U/L (ref 37–153)
BUN/Creatinine Ratio: 14 (calc) (ref 6–22)
BUN: 26 mg/dL — ABNORMAL HIGH (ref 7–25)
CO2: 27 mmol/L (ref 20–32)
Calcium: 10.9 mg/dL — ABNORMAL HIGH (ref 8.6–10.4)
Chloride: 104 mmol/L (ref 98–110)
Creat: 1.84 mg/dL — ABNORMAL HIGH (ref 0.60–1.00)
Globulin: 2.9 g/dL (calc) (ref 1.9–3.7)
Glucose, Bld: 77 mg/dL (ref 65–99)
Potassium: 4.2 mmol/L (ref 3.5–5.3)
Sodium: 140 mmol/L (ref 135–146)
Total Bilirubin: 0.9 mg/dL (ref 0.2–1.2)
Total Protein: 7.3 g/dL (ref 6.1–8.1)
eGFR: 28 mL/min/{1.73_m2} — ABNORMAL LOW (ref >=60)

## 2022-01-20 LAB — CBC WITH DIFFERENTIAL/PLATELET
Absolute Monocytes: 625 cells/uL (ref 200–950)
Basophils Absolute: 19 cells/uL (ref 0–200)
Basophils Relative: 0.4 %
Eosinophils Absolute: 28 cells/uL (ref 15–500)
Eosinophils Relative: 0.6 %
HCT: 37.5 % (ref 35.0–45.0)
Hemoglobin: 12.5 g/dL (ref 11.7–15.5)
Lymphs Abs: 1264 cells/uL (ref 850–3900)
MCH: 29.6 pg (ref 27.0–33.0)
MCHC: 33.3 g/dL (ref 32.0–36.0)
MCV: 88.7 fL (ref 80.0–100.0)
MPV: 11 fL (ref 7.5–12.5)
Monocytes Relative: 13.3 %
Neutro Abs: 2764 cells/uL (ref 1500–7800)
Neutrophils Relative %: 58.8 %
Platelets: 174 10*3/uL (ref 140–400)
RBC: 4.23 10*6/uL (ref 3.80–5.10)
RDW: 13.1 % (ref 11.0–15.0)
Total Lymphocyte: 26.9 %
WBC: 4.7 10*3/uL (ref 3.8–10.8)

## 2022-01-20 LAB — LIPASE: Lipase: 162 U/L — ABNORMAL HIGH (ref 7–60)

## 2022-02-24 ENCOUNTER — Telehealth: Payer: Medicare Other

## 2022-02-27 ENCOUNTER — Telehealth: Payer: Medicare Other

## 2022-03-03 ENCOUNTER — Ambulatory Visit (INDEPENDENT_AMBULATORY_CARE_PROVIDER_SITE_OTHER): Payer: Medicare Other | Admitting: *Deleted

## 2022-03-03 ENCOUNTER — Ambulatory Visit (INDEPENDENT_AMBULATORY_CARE_PROVIDER_SITE_OTHER): Payer: Medicare Other | Admitting: Family Medicine

## 2022-03-03 VITALS — BP 115/70 | HR 87 | Temp 98.3°F | Ht 65.0 in | Wt 157.8 lb

## 2022-03-03 DIAGNOSIS — H60331 Swimmer's ear, right ear: Secondary | ICD-10-CM

## 2022-03-03 DIAGNOSIS — I480 Paroxysmal atrial fibrillation: Secondary | ICD-10-CM

## 2022-03-03 DIAGNOSIS — I10 Essential (primary) hypertension: Secondary | ICD-10-CM

## 2022-03-03 MED ORDER — NEOMYCIN-POLYMYXIN-HC 3.5-10000-1 OT SOLN: 3.0000 [drp] | Freq: Four times a day (QID) | OTIC | 0 refills | Status: DC

## 2022-03-03 MED ORDER — LOSARTAN POTASSIUM 100 MG PO TABS: 100.0000 mg | ORAL_TABLET | Freq: Every day | ORAL | 3 refills | Status: DC

## 2022-03-03 NOTE — Progress Notes (Signed)
? ?Subjective:  ? ? Patient ID: Christine Hogan, female    DOB: [birth year], 76 y.o.   MRN: [id number] ? ?Patient reports pain in her right ear.  On examination there is a sore forming inside the canal at approximately 2 and 3:00.  There is also swelling in the canal and pain with manipulation of her external ear.  I believe that she is developing otitis externa ?Past Medical History:  ?Diagnosis Date  ? Arthritis   ? Atrial fibrillation ([place])   ? Chest pain   ? a. 2014: NST with no evidence of ischemia  ? CKD (chronic kidney disease) stage 3, GFR 30-59 ml/min (HCC)   ? Diastolic dysfunction   ? grade 2 with dyspnea on exertion  ? H/O echocardiogram   ? a. 2014: echo showing EF of 65-70% with Grade 1 DD, moderate TR, and mild to moderate MR  ? Hyperlipidemia   ? Hypertension   ? Near syncope   ? Osteopenia   ? ?Past Surgical History:  ?Procedure Laterality Date  ? ABDOMINAL HYSTERECTOMY    ? TAH/BSO (menorrhagia)  ? bunion repair    ? HERNIA REPAIR    ? ?Current Outpatient Medications on File Prior to Visit  ?Medication Sig Dispense Refill  ? amLODipine (NORVASC) 10 MG tablet TAKE 1 TABLET BY MOUTH  DAILY 90 tablet 3  ? ELIQUIS 5 MG TABS tablet TAKE 1 TABLET BY MOUTH  TWICE DAILY 180 tablet 3  ? hydrochlorothiazide (HYDRODIURIL) 12.5 MG tablet TAKE 1 TABLET BY MOUTH EVERY DAY 90 tablet 0  ? losartan (COZAAR) 100 MG tablet Take 1 tablet (100 mg total) by mouth daily. 90 tablet 0  ? methocarbamol (ROBAXIN) 500 MG tablet Take 1 tablet (500 mg total) by mouth 3 (three) times daily. 10 tablet 0  ? metoprolol tartrate (LOPRESSOR) 50 MG tablet TAKE 1 TABLET BY MOUTH TWICE A DAY 180 tablet 0  ? metoprolol tartrate (LOPRESSOR) 50 MG tablet Take 50 mg by mouth 2 (two) times daily.    ? Multiple Vitamins-Minerals (CENTRUM SILVER 50+WOMEN) TABS Take 1 tablet by mouth daily with breakfast.    ? rosuvastatin (CRESTOR) 10 MG tablet TAKE 1 TABLET BY MOUTH ONCE DAILY 90 tablet 3  ? rosuvastatin (CRESTOR) 10 MG tablet Take 10 mg by mouth  at bedtime.    ? triamcinolone ointment (KENALOG) 0.1 % Apply 1 application topically 2 (two) times daily. Apply small amount twice daily for 7 days; notify prescriber if symptoms not improved after 7 days 30 g 0  ? triamcinolone ointment (KENALOG) 0.1 % Apply 1 application. topically 2 (two) times daily.    ? ?No current facility-administered medications on file prior to visit.  ? ?Allergies  ?Allergen Reactions  ? Codeine Nausea And Vomiting  ? Codeine Nausea And Vomiting and Other (See Comments)  ?  Dizziness, also  ? ?Social History  ? ?Socioeconomic History  ? Marital status: Divorced  ?  Spouse name: Not on file  ? Number of children: 5  ? Years of education: 12th grade  ? Highest education level: Not on file  ?Occupational History  ? Occupation: Retired  ?Tobacco Use  ? Smoking status: Never  ? Smokeless tobacco: Never  ?Vaping Use  ? Vaping Use: Never used  ?Substance and Sexual Activity  ? Alcohol use: Yes  ?  Comment: when asked how much states "too much"  ? Drug use: No  ? Sexual activity: Yes  ?Other Topics Concern  ? Not on file  ?  Social History Narrative  ? ** Merged History Encounter **  ?    ? Lives with her son. ?Right-handed. ?Four living children. ?Occasional caffeine use.  ? ?Social Determinants of Health  ? ?Financial Resource Strain: Low Risk   ? Difficulty of Paying Living Expenses: Not hard at all  ?Food Insecurity: No Food Insecurity  ? Worried About Charity fundraiser in the Last Year: Never true  ? Ran Out of Food in the Last Year: Never true  ?Transportation Needs: No Transportation Needs  ? Lack of Transportation (Medical): No  ? Lack of Transportation (Non-Medical): No  ?Physical Activity: Inactive  ? Days of Exercise per Week: 0 days  ? Minutes of Exercise per Session: 0 min  ?Stress: No Stress Concern Present  ? Feeling of Stress : Not at all  ?Social Connections: Moderately Isolated  ? Frequency of Communication with Friends and Family: More than three times a week  ? Frequency of  Social Gatherings with Friends and Family: More than three times a week  ? Attends Religious Services: More than 4 times per year  ? Active Member of Clubs or Organizations: No  ? Attends Archivist Meetings: Never  ? Marital Status: Divorced  ?Intimate Partner Violence: Not At Risk  ? Fear of Current or Ex-Partner: No  ? Emotionally Abused: No  ? Physically Abused: No  ? Sexually Abused: No  ? ? ? ?Review of Systems  ?Gastrointestinal:  Positive for abdominal pain.  ?All other systems reviewed and are negative. ? ?   ?Objective:  ? Physical Exam ?Vitals reviewed.  ?Constitutional:   ?   General: She is not in acute distress. ?   Appearance: Normal appearance. She is normal weight. She is not ill-appearing, toxic-appearing or diaphoretic.  ?HENT:  ?   Left Ear: Tympanic membrane normal. Swelling and tenderness present.  No middle ear effusion. There is no impacted cerumen.  ?Eyes:  ?   Extraocular Movements: Extraocular movements intact.  ?   Pupils: Pupils are equal, round, and reactive to light.  ?Neck:  ?   Vascular: No carotid bruit.  ?Cardiovascular:  ?   Rate and Rhythm: Normal rate and regular rhythm.  ?   Pulses: Normal pulses.  ?   Heart sounds: Normal heart sounds. No murmur heard. ?  No friction rub. No gallop.  ?Pulmonary:  ?   Effort: Pulmonary effort is normal. No respiratory distress.  ?   Breath sounds: Normal breath sounds. No stridor. No wheezing, rhonchi or rales.  ?Abdominal:  ?   General: Abdomen is flat. There is no distension.  ?   Palpations: Abdomen is soft.  ?   Tenderness: There is no abdominal tenderness. There is no guarding.  ?Neurological:  ?   General: No focal deficit present.  ?   Mental Status: She is alert and oriented to person, place, and time. Mental status is at baseline.  ?   Cranial Nerves: No cranial nerve deficit.  ?   Sensory: No sensory deficit.  ?   Motor: No weakness.  ?   Coordination: Coordination normal.  ?   Gait: Gait normal.  ?   Deep Tendon Reflexes:  Reflexes normal.  ?Psychiatric:     ?   Mood and Affect: Mood normal.     ?   Behavior: Behavior normal.     ?   Thought Content: Thought content normal.     ?   Judgment: Judgment normal.  ? ? ? ? ? ?   ?  Assessment & Plan:  ?Acute swimmer's ear of right side ?Begin Cortisporin HC otic 3 drops 4 times daily for 1 week.  Recheck if no better in 1 week or sooner if worse.  Continue to encourage abstinence from alcohol. ?

## 2022-03-03 NOTE — Patient Instructions (Signed)
Visit Information ? ?Thank you for taking time to visit with me today. Please don't hesitate to contact me if I can be of assistance to you before our next scheduled telephone appointment. ? ?Following are the goals we discussed today:  ?Take medications as prescribed   ?Attend all scheduled provider appointments ?Perform all self care activities independently  ?Perform IADL's (shopping, preparing meals, housekeeping, managing finances) independently ?Call provider office for new concerns or questions  ?check pulse (heart) rate once a day ?cut down alcohol use ?make a plan to exercise regularly ?make a plan to eat healthy ?keep all lab appointments ?take medicine as prescribed ?check blood pressure 3 times per week ?write blood pressure results in a log or diary ?keep a blood pressure log ?take blood pressure log to all doctor appointments ?develop an action plan for high blood pressure ?keep all doctor appointments ?take medications for blood pressure exactly as prescribed ?Try to do some walking, get outside on warm days ?Follow low sodium diet- read labels and limit fast food ?Check heart rate daily ? ?Our next appointment is by telephone on 05/26/22 at 215 pm ? ?Please call the care guide team at [phone] if you need to cancel or reschedule your appointment.  ? ?If you are experiencing a Mental Health or Behavioral Health Crisis or need someone to talk to, please call the Suicide and Crisis Lifeline: 988 ?call the [place] National Suicide Prevention Lifeline: (832) 459-6234 or TTY: (534) 363-5424 TTY (248)878-0197) to talk to a trained counselor ?call 1-800-273-TALK (toll free, 24 hour hotline) ?go to [facility] [address] [place] [phone]) ?call 911  ? ?The patient verbalized understanding of instructions, educational materials, and care plan provided today and declined offer to receive copy of patient instructions, educational materials, and care plan.  ? ?Irving Shows RNC, BSN ?RN Case Manager ?[facility] ?[phone] ? ?

## 2022-03-03 NOTE — Chronic Care Management (AMB) (Signed)
?Chronic Care Management  ? ?CCM RN Visit Note ? ?03/03/2022 ?Name: Christine Hogan MRN: [id number] DOB: [birth year] ? ?Subjective: ?Christine Hogan is a 76 y.o. year old female who is a primary care patient of Pickard, Cammie Mcgee, MD. The care management team was consulted for assistance with disease management and care coordination needs.   ? ?Engaged with patient by telephone for follow up visit in response to provider referral for case management and/or care coordination services.  ? ?Consent to Services:  ?The patient was given information about Chronic Care Management services, agreed to services, and gave verbal consent prior to initiation of services.  Please see initial visit note for detailed documentation.  ? ?Patient agreed to services and verbal consent obtained.  ? ?Assessment: Review of patient past medical history, allergies, medications, health status, including review of consultants reports, laboratory and other test data, was performed as part of comprehensive evaluation and provision of chronic care management services.  ? ?SDOH (Social Determinants of Health) assessments and interventions performed:   ? ?CCM Care Plan ? ?Allergies  ?Allergen Reactions  ? Codeine Nausea And Vomiting  ? Codeine Nausea And Vomiting and Other (See Comments)  ?  Dizziness, also  ? ? ?Outpatient Encounter Medications as of 03/03/2022  ?Medication Sig  ? amLODipine (NORVASC) 10 MG tablet TAKE 1 TABLET BY MOUTH  DAILY  ? ELIQUIS 5 MG TABS tablet TAKE 1 TABLET BY MOUTH  TWICE DAILY  ? hydrochlorothiazide (HYDRODIURIL) 12.5 MG tablet TAKE 1 TABLET BY MOUTH EVERY DAY  ? losartan (COZAAR) 100 MG tablet Take 1 tablet (100 mg total) by mouth daily.  ? methocarbamol (ROBAXIN) 500 MG tablet Take 1 tablet (500 mg total) by mouth 3 (three) times daily.  ? metoprolol tartrate (LOPRESSOR) 50 MG tablet TAKE 1 TABLET BY MOUTH TWICE A DAY  ? Multiple Vitamins-Minerals (CENTRUM SILVER 50+WOMEN) TABS Take 1 tablet by mouth daily with breakfast.  ?  neomycin-polymyxin-hydrocortisone (CORTISPORIN) OTIC solution Place 3 drops into the right ear 4 (four) times daily.  ? rosuvastatin (CRESTOR) 10 MG tablet TAKE 1 TABLET BY MOUTH ONCE DAILY  ? rosuvastatin (CRESTOR) 10 MG tablet Take 10 mg by mouth at bedtime.  ? metoprolol tartrate (LOPRESSOR) 50 MG tablet Take 50 mg by mouth 2 (two) times daily. (Patient not taking: Reported on 03/03/2022)  ? ?No facility-administered encounter medications on file as of 03/03/2022.  ? ? ?Patient Active Problem List  ? Diagnosis Date Noted  ? CKD (chronic kidney disease) stage 3, GFR 30-59 ml/min (HCC)   ? Abnormal finding on MRI of brain 09/15/2019  ? Alteration of consciousness 08/26/2019  ? Syncope 05/26/2019  ? Nonrheumatic mitral (valve) insufficiency 10/08/2018  ? Vasovagal syncope 10/08/2018  ? Osteopenia   ? Diastolic dysfunction   ? Paroxysmal atrial fibrillation ([place]) 02/09/2017  ? Chronic renal insufficiency 06/28/2013  ? Chest pain 06/27/2013  ? Essential hypertension   ? Mixed hyperlipidemia   ? ? ?Conditions to be addressed/monitored:Atrial Fibrillation and HTN ? ?Care Plan : RN Care Manager Plan of Care  ?Updates made by Kassie Mends, RN since 03/03/2022 12:00 AM  ?  ? ?Problem: No plan of care established for management of chronic disease states  (HTN, PAF)   ?Priority: High  ?  ? ?Long-Range Goal: Development of plan of care for chronic disease management  (HTN, PAF)   ?Start Date: 12/02/2021  ?Expected End Date: 05/31/2022  ?Priority: High  ?Note:   ?Current Barriers:  ?Knowledge Deficits related to  plan of care for management of Atrial Fibrillation and HTN  ?Knowledge deficits related to self health management of chronic afib- needs reinforcement for action plan / management atrial fibrillation. Pt reports her adult son lives there with her but pt states she is overall independent with all aspects of her care, has a cane "just in case" , she continues to drive, gets out to church and visits friends.    ?Chronic  Disease Management support and education needs related to afib- pt denies any dizziness, no further episodes of syncope, reports had MRI and "everything good", wore heart monitor and "everything ok with that". ?No Advanced Directives in place- pt reports she received advanced directives packet ?Knowledge Deficits related to basic understanding of hypertension pathophysiology and self care management- pt reports checks blood pressure 1-2 x per week with readings " good with today's reading 115/70" Reports taking all medications as prescribed. Reports sometimes she has difficulty sleeping and takes melatonin at times, does take day time naps at times. ?Does not adhere to provider recommendations re:  does not adhere to any special diet ?Patient reports she saw primary care provider today and diagnosed with acute right swimmer's ear and is using cortisporin ear drops ? ?RNCM Clinical Goal(s):  ?Patient will verbalize understanding of plan for management of Atrial Fibrillation and HTN as evidenced by patient report, review of EHR and  through collaboration with RN Care manager, provider, and care team.  ? ?Interventions: ?1:1 collaboration with primary care provider regarding development and update of comprehensive plan of care as evidenced by provider attestation and co-signature ?Inter-disciplinary care team collaboration (see longitudinal plan of care) ?Evaluation of current treatment plan related to  self management and patient's adherence to plan as established by provider ? ? ?AFIB Interventions: (Status:  Goal on track:  Yes.) Long Term Goal ?  Counseled on importance of regular laboratory monitoring as prescribed ?Afib action plan reviewed ?Reviewed importance of checking heart rate daily ?Pain assessment completed ?Reviewed upcoming scheduled appointments ? ?Hypertension Interventions:  (Status:  Goal on track:  Yes.) Long Term Goal ?Last practice recorded BP readings:  ?BP Readings from Last 3 Encounters:   ?09/24/21 104/67  ?09/23/21 (!) 152/74  ?09/19/21 138/68  ?Most recent eGFR/CrCl:  ?Lab Results  ?Component Value Date  ? EGFR 33 (L) 09/19/2021  ?  No components found for: CRCL ? ?Evaluation of current treatment plan related to hypertension self management and patient's adherence to plan as established by provider ?Counseled on the importance of exercise goals with target of 150 minutes per week ?Advised patient, providing education and rationale, to monitor blood pressure daily and record, calling PCP for findings outside established parameters ?Discussed complications of poorly controlled blood pressure such as heart disease, stroke, circulatory complications, vision complications, kidney impairment, sexual dysfunction  ?Reviewed low sodium diet and importance of reading labs ?Encouraged patient to limit day time naps to help with night time sleep ? ?Patient Goals/Self-Care Activities: ?Take medications as prescribed   ?Attend all scheduled provider appointments ?Perform all self care activities independently  ?Perform IADL's (shopping, preparing meals, housekeeping, managing finances) independently ?Call provider office for new concerns or questions  ?- check pulse (heart) rate once a day ?- cut down alcohol use ?- make a plan to exercise regularly ?- make a plan to eat healthy ?- keep all lab appointments ?- take medicine as prescribed ?check blood pressure 3 times per week ?write blood pressure results in a log or diary ?keep a blood pressure log ?  take blood pressure log to all doctor appointments ?develop an action plan for high blood pressure ?keep all doctor appointments ?take medications for blood pressure exactly as prescribed ?Try to do some walking, get outside on warm days ?Follow low sodium diet- read labels and limit fast food ?Check heart rate daily ?  ?  ? ? ?Plan:Telephone follow up appointment with care management team member scheduled for:  05/26/22 ? ?Jacqlyn Larsen RNC, BSN ?RN Case  Manager ?[place] ?[phone] ? ? ? ? ? ? ? ? ? ?

## 2022-03-05 DIAGNOSIS — I10 Essential (primary) hypertension: Secondary | ICD-10-CM

## 2022-03-05 DIAGNOSIS — I480 Paroxysmal atrial fibrillation: Secondary | ICD-10-CM | POA: Diagnosis not present

## 2022-03-09 ENCOUNTER — Ambulatory Visit (INDEPENDENT_AMBULATORY_CARE_PROVIDER_SITE_OTHER): Payer: Medicare Other

## 2022-03-09 ENCOUNTER — Other Ambulatory Visit: Payer: Self-pay | Admitting: Family Medicine

## 2022-03-09 VITALS — Ht 65.0 in | Wt 157.0 lb

## 2022-03-09 DIAGNOSIS — Z780 Asymptomatic menopausal state: Secondary | ICD-10-CM

## 2022-03-09 DIAGNOSIS — Z1231 Encounter for screening mammogram for malignant neoplasm of breast: Secondary | ICD-10-CM

## 2022-03-09 DIAGNOSIS — Z0000 Encounter for general adult medical examination without abnormal findings: Secondary | ICD-10-CM | POA: Diagnosis not present

## 2022-03-09 NOTE — Progress Notes (Signed)
? ?Subjective:  ? Christine Hogan is a 76 y.o. female who presents for Medicare Annual (Subsequent) preventive examination. ?Virtual Visit via Telephone Note ? ?I connected with  [name] on 03/09/22 at  9:00 AM EDT by telephone and verified that I am speaking with the correct person using two identifiers. ?VISIT WAS SCHEDULED AS IN OFFICE BUT PERFORMED VIA PHONE. ?Location: ?Patient: HOME ?Provider: [facility] ?Persons participating in the virtual visit: patient/Nurse Health Advisor ?  ?I discussed the limitations, risks, security and privacy concerns of performing an evaluation and management service by telephone and the availability of in person appointments. The patient expressed understanding and agreed to proceed. ? ?Interactive audio and video telecommunications were attempted between this nurse and patient, however failed, due to patient having technical difficulties OR patient did not have access to video capability.  We continued and completed visit with audio only. ? ?Some vital signs may be absent or patient reported.  ? ?[name], LPN ? ?Review of Systems    ? ?Cardiac Risk Factors include: advanced age (>58men, >38 women);dyslipidemia;hypertension;sedentary lifestyle ? ?   ?Objective:  ?  ?Today's Vitals  ? 03/09/22 0912  ?Weight: 157 lb (71.2 kg)  ?Height: 5\' 5"  (1.651 m)  ? ?Body mass index is 26.13 kg/m?. ? ? ?  03/09/2022  ?  9:18 AM 07/01/2021  ?  2:29 PM 03/04/2021  ?  3:50 PM 09/12/2020  ? 12:57 PM 05/13/2020  ?  2:09 PM 01/02/2016  ? 10:53 AM  ?Advanced Directives  ?Does Patient Have a Medical Advance Directive? No No No No No No  ?Would patient like information on creating a medical advance directive? No - Patient declined Yes (MAU/Ambulatory/Procedural Areas - Information given) No - Patient declined   No - patient declined information  ? ? ?Current Medications (verified) ?Outpatient Encounter Medications as of 03/09/2022  ?Medication Sig  ? amLODipine (NORVASC) 10 MG tablet TAKE 1 TABLET BY  MOUTH  DAILY  ? ELIQUIS 5 MG TABS tablet TAKE 1 TABLET BY MOUTH  TWICE DAILY  ? hydrochlorothiazide (HYDRODIURIL) 12.5 MG tablet TAKE 1 TABLET BY MOUTH EVERY DAY  ? losartan (COZAAR) 100 MG tablet Take 1 tablet (100 mg total) by mouth daily.  ? methocarbamol (ROBAXIN) 500 MG tablet Take 1 tablet (500 mg total) by mouth 3 (three) times daily.  ? metoprolol tartrate (LOPRESSOR) 50 MG tablet TAKE 1 TABLET BY MOUTH TWICE A DAY  ? metoprolol tartrate (LOPRESSOR) 50 MG tablet Take 50 mg by mouth 2 (two) times daily.  ? Multiple Vitamins-Minerals (CENTRUM SILVER 50+WOMEN) TABS Take 1 tablet by mouth daily with breakfast.  ? neomycin-polymyxin-hydrocortisone (CORTISPORIN) OTIC solution Place 3 drops into the right ear 4 (four) times daily.  ? rosuvastatin (CRESTOR) 10 MG tablet TAKE 1 TABLET BY MOUTH ONCE DAILY  ? rosuvastatin (CRESTOR) 10 MG tablet Take 10 mg by mouth at bedtime.  ? ?No facility-administered encounter medications on file as of 03/09/2022.  ? ? ?Allergies (verified) ?Codeine and Codeine  ? ?History: ?Past Medical History:  ?Diagnosis Date  ? Arthritis   ? Atrial fibrillation ([place])   ? Chest pain   ? a. 2014: NST with no evidence of ischemia  ? CKD (chronic kidney disease) stage 3, GFR 30-59 ml/min (HCC)   ? Diastolic dysfunction   ? grade 2 with dyspnea on exertion  ? H/O echocardiogram   ? a. 2014: echo showing EF of 65-70% with Grade 1 DD, moderate TR, and mild to moderate  MR  ? Hyperlipidemia   ? Hypertension   ? Near syncope   ? Osteopenia   ? ?Past Surgical History:  ?Procedure Laterality Date  ? ABDOMINAL HYSTERECTOMY    ? TAH/BSO (menorrhagia)  ? bunion repair    ? HERNIA REPAIR    ? ?Family History  ?Problem Relation Age of Onset  ? Kidney failure Mother   ? Hypertension Father   ? Heart disease Father   ?     pacemaker- followedby Dr. Sallyanne Kuster  ? Diabetes Sister   ? Hypertension Sister   ? Diabetes Sister   ? Hypertension Sister   ? Hypertension Daughter   ? Hypertension Daughter   ? Breast cancer  Daughter   ? ?Social History  ? ?Socioeconomic History  ? Marital status: Divorced  ?  Spouse name: Not on file  ? Number of children: 5  ? Years of education: 12th grade  ? Highest education level: Not on file  ?Occupational History  ? Occupation: Retired  ?Tobacco Use  ? Smoking status: Never  ? Smokeless tobacco: Never  ?Vaping Use  ? Vaping Use: Never used  ?Substance and Sexual Activity  ? Alcohol use: Yes  ?  Comment: when asked how much states "too much"  ? Drug use: No  ? Sexual activity: Yes  ?Other Topics Concern  ? Not on file  ?Social History Narrative  ? ** Merged History Encounter **  ?    ? Lives with her son. ?Right-handed. ?Four living children. ?Occasional caffeine use.  ? ?Social Determinants of Health  ? ?Financial Resource Strain: Low Risk   ? Difficulty of Paying Living Expenses: Not hard at all  ?Food Insecurity: No Food Insecurity  ? Worried About Charity fundraiser in the Last Year: Never true  ? Ran Out of Food in the Last Year: Never true  ?Transportation Needs: No Transportation Needs  ? Lack of Transportation (Medical): No  ? Lack of Transportation (Non-Medical): No  ?Physical Activity: Inactive  ? Days of Exercise per Week: 0 days  ? Minutes of Exercise per Session: 0 min  ?Stress: No Stress Concern Present  ? Feeling of Stress : Not at all  ?Social Connections: Moderately Integrated  ? Frequency of Communication with Friends and Family: More than three times a week  ? Frequency of Social Gatherings with Friends and Family: More than three times a week  ? Attends Religious Services: More than 4 times per year  ? Active Member of Clubs or Organizations: Yes  ? Attends Archivist Meetings: More than 4 times per year  ? Marital Status: Divorced  ? ? ?Tobacco Counseling ?Counseling given: Not Answered ? ? ?Clinical Intake: ? ?Pre-visit preparation completed: Yes ? ?Pain : No/denies pain ? ?  ? ?BMI - recorded: 26.13 ?Nutritional Status: BMI 25 -29 Overweight ?Nutritional Risks:  None ?Diabetes: No ? ?How often do you need to have someone help you when you read instructions, pamphlets, or other written materials from your doctor or pharmacy?: 1 - Never ? ?Diabetic?NO ? ?Interpreter Needed?: No ? ?Information entered by :: mj Stacie Templin, lpn ? ? ?Activities of Daily Living ? ?  03/09/2022  ?  9:22 AM  ?In your present state of health, do you have any difficulty performing the following activities:  ?Hearing? 0  ?Vision? 0  ?Difficulty concentrating or making decisions? 0  ?Walking or climbing stairs? 0  ?Dressing or bathing? 0  ?Doing errands, shopping? 0  ?Preparing Food and eating ?  N  ?Using the Toilet? N  ?In the past six months, have you accidently leaked urine? N  ?Do you have problems with loss of bowel control? N  ?Managing your Medications? N  ?Managing your Finances? N  ?Housekeeping or managing your Housekeeping? N  ? ? ?Patient Care Team: ?Susy Frizzle, MD as PCP - General (Family Medicine) ?Croitoru, Dani Gobble, MD as PCP - Cardiology (Cardiology) ?Kassie Mends, RN as [facility] Management ?Susy Frizzle, MD (Family Medicine) ? ?Indicate any recent Medical Services you may have received from other than [facility] providers in the past year (date may be approximate). ? ?   ?Assessment:  ? This is a routine wellness examination for Makiyah. ? ?Hearing/Vision screen ?Hearing Screening - Comments:: No hearing issues.  ?Vision Screening - Comments:: Glasses. 01/2022. ? ?Dietary issues and exercise activities discussed: ?Current Exercise Habits: The patient does not participate in regular exercise at present, Exercise limited by: cardiac condition(s);orthopedic condition(s) ? ? Goals Addressed   ? ?  ?  ?  ?  ? This Visit's Progress  ?  Exercise 3x per week (30 min per time)   Not on track  ?  03/09/2021-Encouraged chair exercises.  ?  ?  Reduce alcohol intake   On track  ? ?  ? ?Depression Screen ? ?  03/09/2022  ?  9:14 AM 07/01/2021  ?  2:12 PM 03/04/2021  ?  3:54 PM 12/24/2017   ?  9:34 AM 12/24/2017  ?  9:30 AM 07/06/2017  ?  8:16 AM 05/12/2014  ?  9:37 AM  ?PHQ 2/9 Scores  ?PHQ - 2 Score 0 0 0 0 0 0 0  ?  ?Fall Risk ? ?  03/09/2022  ?  9:21 AM 07/01/2021  ?  2:11 PM 03/04/2021  ?  3:52 PM

## 2022-03-09 NOTE — Patient Instructions (Signed)
Ms. Maines , ?Thank you for taking time to come for your Medicare Wellness Visit. I appreciate your ongoing commitment to your health goals. Please review the following plan we discussed and let me know if I can assist you in the future.  ? ?Screening recommendations/referrals: ?Colonoscopy:  ?Mammogram: Done 08/04/2021 Repeat annually ?Bone Density: Dexa 01/29/2018 Repeat every 2 years ? ?Recommended yearly ophthalmology/optometry visit for glaucoma screening and checkup ?Recommended yearly dental visit for hygiene and checkup ? ?Vaccinations: ?Influenza vaccine: Due Fall 2023. ?Pneumococcal vaccine: Done 05/12/2014 and 12/24/2017 ?Tdap vaccine: Done 01/02/2016 Repeat in 10 years ? ?Shingles vaccine: Discussed.   ?Covid-19:Done 12/13/2019, 01/03/2020 and 09/02/2020 ? ?Advanced directives: Advance directive discussed with you today. I have provided a copy for you to complete at home and have notarized. Once this is complete please bring a copy in to our office so we can scan it into your chart. ?MAILED OUT TODAY. ? ?Conditions/risks identified: Aim for 30 minutes of exercise or brisk walking, 6-8 glasses of water, and 5 servings of fruits and vegetables each day. ? ? ?Next appointment: Follow up in one year for your annual wellness visit 03/15/2023 @ 9 AM.  ? ? ?Preventive Care 13 Years and Older, Female ?Preventive care refers to lifestyle choices and visits with your health care provider that can promote health and wellness. ?What does preventive care include? ?A yearly physical exam. This is also called an annual well check. ?Dental exams once or twice a year. ?Routine eye exams. Ask your health care provider how often you should have your eyes checked. ?Personal lifestyle choices, including: ?Daily care of your teeth and gums. ?Regular physical activity. ?Eating a healthy diet. ?Avoiding tobacco and drug use. ?Limiting alcohol use. ?Practicing safe sex. ?Taking low-dose aspirin every day. ?Taking vitamin and mineral  supplements as recommended by your health care provider. ?What happens during an annual well check? ?The services and screenings done by your health care provider during your annual well check will depend on your age, overall health, lifestyle risk factors, and family history of disease. ?Counseling  ?Your health care provider may ask you questions about your: ?Alcohol use. ?Tobacco use. ?Drug use. ?Emotional well-being. ?Home and relationship well-being. ?Sexual activity. ?Eating habits. ?History of falls. ?Memory and ability to understand (cognition). ?Work and work Statistician. ?Reproductive health. ?Screening  ?You may have the following tests or measurements: ?Height, weight, and BMI. ?Blood pressure. ?Lipid and cholesterol levels. These may be checked every 5 years, or more frequently if you are over 75 years old. ?Skin check. ?Lung cancer screening. You may have this screening every year starting at age 71 if you have a 30-pack-year history of smoking and currently smoke or have quit within the past 15 years. ?Fecal occult blood test (FOBT) of the stool. You may have this test every year starting at age 48. ?Flexible sigmoidoscopy or colonoscopy. You may have a sigmoidoscopy every 5 years or a colonoscopy every 10 years starting at age 90. ?Hepatitis C blood test. ?Hepatitis B blood test. ?Sexually transmitted disease (STD) testing. ?Diabetes screening. This is done by checking your blood sugar (glucose) after you have not eaten for a while (fasting). You may have this done every 1-3 years. ?Bone density scan. This is done to screen for osteoporosis. You may have this done starting at age 13. ?Mammogram. This may be done every 1-2 years. Talk to your health care provider about how often you should have regular mammograms. ?Talk with your health care provider about your  test results, treatment options, and if necessary, the need for more tests. ?Vaccines  ?Your health care provider may recommend certain  vaccines, such as: ?Influenza vaccine. This is recommended every year. ?Tetanus, diphtheria, and acellular pertussis (Tdap, Td) vaccine. You may need a Td booster every 10 years. ?Zoster vaccine. You may need this after age 79. ?Pneumococcal 13-valent conjugate (PCV13) vaccine. One dose is recommended after age 74. ?Pneumococcal polysaccharide (PPSV23) vaccine. One dose is recommended after age 83. ?Talk to your health care provider about which screenings and vaccines you need and how often you need them. ?This information is not intended to replace advice given to you by your health care provider. Make sure you discuss any questions you have with your health care provider. ?Document Released: 11/19/2015 Document Revised: 07/12/2016 Document Reviewed: 08/24/2015 ?Elsevier Interactive Patient Education ? 2017 [place]. ? ?Fall Prevention in the Home ?Falls can cause injuries. They can happen to people of all ages. There are many things you can do to make your home safe and to help prevent falls. ?What can I do on the outside of my home? ?Regularly fix the edges of walkways and driveways and fix any cracks. ?Remove anything that might make you trip as you walk through a door, such as a raised step or threshold. ?Trim any bushes or trees on the path to your home. ?Use bright outdoor lighting. ?Clear any walking paths of anything that might make someone trip, such as rocks or tools. ?Regularly check to see if handrails are loose or broken. Make sure that both sides of any steps have handrails. ?Any raised decks and porches should have guardrails on the edges. ?Have any leaves, snow, or ice cleared regularly. ?Use sand or salt on walking paths during winter. ?Clean up any spills in your garage right away. This includes oil or grease spills. ?What can I do in the bathroom? ?Use night lights. ?Install grab bars by the toilet and in the tub and shower. Do not use towel bars as grab bars. ?Use non-skid mats or decals in  the tub or shower. ?If you need to sit down in the shower, use a plastic, non-slip stool. ?Keep the floor dry. Clean up any water that spills on the floor as soon as it happens. ?Remove soap buildup in the tub or shower regularly. ?Attach bath mats securely with double-sided non-slip rug tape. ?Do not have throw rugs and other things on the floor that can make you trip. ?What can I do in the bedroom? ?Use night lights. ?Make sure that you have a light by your bed that is easy to reach. ?Do not use any sheets or blankets that are too big for your bed. They should not hang down onto the floor. ?Have a firm chair that has side arms. You can use this for support while you get dressed. ?Do not have throw rugs and other things on the floor that can make you trip. ?What can I do in the kitchen? ?Clean up any spills right away. ?Avoid walking on wet floors. ?Keep items that you use a lot in easy-to-reach places. ?If you need to reach something above you, use a strong step stool that has a grab bar. ?Keep electrical cords out of the way. ?Do not use floor polish or wax that makes floors slippery. If you must use wax, use non-skid floor wax. ?Do not have throw rugs and other things on the floor that can make you trip. ?What can I do with my  stairs? ?Do not leave any items on the stairs. ?Make sure that there are handrails on both sides of the stairs and use them. Fix handrails that are broken or loose. Make sure that handrails are as long as the stairways. ?Check any carpeting to make sure that it is firmly attached to the stairs. Fix any carpet that is loose or worn. ?Avoid having throw rugs at the top or bottom of the stairs. If you do have throw rugs, attach them to the floor with carpet tape. ?Make sure that you have a light switch at the top of the stairs and the bottom of the stairs. If you do not have them, ask someone to add them for you. ?What else can I do to help prevent falls? ?Wear shoes that: ?Do not have high  heels. ?Have rubber bottoms. ?Are comfortable and fit you well. ?Are closed at the toe. Do not wear sandals. ?If you use a stepladder: ?Make sure that it is fully opened. Do not climb a closed stepladder. ?Make sur

## 2022-03-10 NOTE — Telephone Encounter (Signed)
Requested medication (s) are due for refill today: Yes ? ?Requested medication (s) are on the active medication list: Yes ? ?Last refill:  02/24/21 ? ?Future visit scheduled: No ? ?Notes to clinic:  Prescription expired. ? ? ? ?Requested Prescriptions  ?Pending Prescriptions Disp Refills  ? rosuvastatin (CRESTOR) 10 MG tablet [Pharmacy Med Name: Rosuvastatin Calcium 10 MG Oral Tablet] 100 tablet 2  ?  Sig: TAKE 1 TABLET BY MOUTH ONCE DAILY  ?  ? Cardiovascular:  Antilipid - Statins 2 Failed - 03/09/2022 11:02 PM  ?  ?  Failed - Cr in normal range and within 360 days  ?  Creat  ?Date Value Ref Range Status  ?01/19/2022 1.84 (H) 0.60 - 1.00 mg/dL Final  ?  ?  ?  ?  Failed - Lipid Panel in normal range within the last 12 months  ?  Cholesterol, Total  ?Date Value Ref Range Status  ?07/14/2020 140 100 - 199 mg/dL Final  ?07/10/2013 191 <200 mg/dL Final  ? ?LDL (calc)  ?Date Value Ref Range Status  ?07/10/2013 97 <100 mg/dL Final  ?  Comment:  ?  LDL-C is inaccurate if patient is nonfasting. ?  ?Reference Range: ?---------------- ?Optimal:            <100 ?Near/Above Optimal: 100-129 ?Borderline High:    130-159 ?High:               160-189 ?Very High:          >=190 ?  ?   ? ?LDL Cholesterol (Calc)  ?Date Value Ref Range Status  ?12/24/2017 158 (H) mg/dL (calc) Final  ?  Comment:  ?  Reference range: <100 ?Marland Kitchen ?Desirable range <100 mg/dL for primary prevention;   ?<70 mg/dL for patients with CHD or diabetic patients  ?with > or = 2 CHD risk factors. ?. ?LDL-C is now calculated using the Martin-Hopkins  ?calculation, which is a validated novel method providing  ?better accuracy than the Friedewald equation in the  ?estimation of LDL-C.  ?Cresenciano Genre et al. Annamaria Helling. WG:[id number]): 2061-2068  ?(http://education.[employer].com/faq/FAQ164) ?  ? ?LDL Chol Calc (NIH)  ?Date Value Ref Range Status  ?07/14/2020 54 0 - 99 mg/dL Final  ? ?HDL-C  ?Date Value Ref Range Status  ?07/10/2013 52 >=40 mg/dL Final  ? ?HDL  ?Date Value Ref  Range Status  ?07/14/2020 48 >39 mg/dL Final  ? ?Triglycerides  ?Date Value Ref Range Status  ?07/14/2020 243 (H) 0 - 149 mg/dL Final  ?07/10/2013 210 (H) <150 mg/dL Final  ? ?  ?  ?  Passed - Patient is not pregnant  ?  ?  Passed - Valid encounter within last 12 months  ?  Recent Outpatient Visits   ? ?      ? 1 week ago Acute swimmer's ear of right side  ? [facility] Pickard, Cammie Mcgee, MD  ? 1 month ago Generalized abdominal pain  ? [facility] Pickard, Cammie Mcgee, MD  ? 2 months ago Acute pain of right knee  ? [facility] Pickard, Cammie Mcgee, MD  ? 2 months ago Dermatitis  ? [place], NP  ? 5 months ago Syncope, unspecified syncope type  ? [facility] Pickard, Cammie Mcgee, MD  ? ?  ?  ? ? ?  ?  ?  ?Y ?

## 2022-03-17 ENCOUNTER — Other Ambulatory Visit: Payer: Self-pay | Admitting: Cardiovascular Disease

## 2022-03-20 ENCOUNTER — Other Ambulatory Visit: Payer: Self-pay | Admitting: Cardiovascular Disease

## 2022-04-03 ENCOUNTER — Other Ambulatory Visit: Payer: Self-pay | Admitting: Cardiovascular Disease

## 2022-05-26 ENCOUNTER — Ambulatory Visit (HOSPITAL_COMMUNITY)
Admission: EM | Admit: 2022-05-26 | Discharge: 2022-05-26 | Disposition: A | Discharge status: Home / Self Care | Payer: Medicare Other | Attending: Emergency Medicine | Admitting: Emergency Medicine

## 2022-05-26 ENCOUNTER — Telehealth: Payer: Self-pay

## 2022-05-26 ENCOUNTER — Encounter (HOSPITAL_COMMUNITY): Payer: Self-pay

## 2022-05-26 DIAGNOSIS — M25531 Pain in right wrist: Secondary | ICD-10-CM

## 2022-05-26 MED ORDER — DICLOFENAC SODIUM 1 % EX GEL: 2.0000 g | Freq: Four times a day (QID) | CUTANEOUS | 0 refills | Status: AC

## 2022-05-26 MED ORDER — METHYLPREDNISOLONE SODIUM SUCC 125 MG IJ SOLR: 60.0000 mg | Freq: Once | INTRAMUSCULAR | Status: DC

## 2022-05-26 MED ORDER — METHYLPREDNISOLONE SODIUM SUCC 125 MG IJ SOLR
INTRAMUSCULAR | Status: AC
  Filled 2022-05-26: qty 2

## 2022-05-26 MED ORDER — LIDOCAINE 5 % EX OINT: 1.0000 | TOPICAL_OINTMENT | CUTANEOUS | 0 refills | Status: AC | PRN

## 2022-05-26 NOTE — Discharge Instructions (Signed)
Your pain is most likely caused by irritation to the muscles or ligaments.   You may use Voltaren gel and lidocaine ointment every 6 hours as needed to help manage her discomfort, I suggest using consistently for the next 3 to 5 days then as needed  You may use heating pad in 15 minute intervals as needed for additional comfort, or you may find comfort in using ice in 10-15 minutes over affected area  Begin stretching affected area daily for 10 minutes as tolerated to further loosen muscles   When lying down place pillow underneath and between knees for support  If pain persist after recommended treatment or reoccurs if may be beneficial to follow up with orthopedic specialist for evaluation, this doctor specializes in the bones and can manage your symptoms long-term with options such as but not limited to imaging, medications or physical therapy

## 2022-05-26 NOTE — ED Triage Notes (Signed)
Pt C/O pain and swelling to the right wrist. Denies injury.

## 2022-05-26 NOTE — ED Provider Notes (Signed)
[facility]    CSN: [id number] Arrival date & time: 05/26/22  1713      History   Chief Complaint Chief Complaint  Patient presents with   Wrist Pain    HPI Christine Hogan is a 76 y.o. female.   Patient presents with right wrist pain and swelling beginning today approximately around 1 PM.  Pain is extending into the forearm.  Range of motion is intact but pain is elicited with turning the arm and with rotation.  Has attempted use of Tylenol which has been ineffective.  Denies numbness or tingling, injury or trauma.  History of arthritis but has not been diagnosed in the wrist.  Past Medical History:  Diagnosis Date   Arthritis    Atrial fibrillation (HCC)    Chest pain    a. 2014: NST with no evidence of ischemia   CKD (chronic kidney disease) stage 3, GFR 30-59 ml/min (HCC)    Diastolic dysfunction    grade 2 with dyspnea on exertion   H/O echocardiogram    a. 2014: echo showing EF of 65-70% with Grade 1 DD, moderate TR, and mild to moderate MR   Hyperlipidemia    Hypertension    Near syncope    Osteopenia     Patient Active Problem List   Diagnosis Date Noted   CKD (chronic kidney disease) stage 3, GFR 30-59 ml/min (HCC)    Abnormal finding on MRI of brain 09/15/2019   Alteration of consciousness 08/26/2019   Syncope 05/26/2019   Nonrheumatic mitral (valve) insufficiency 10/08/2018   Vasovagal syncope 10/08/2018   Osteopenia    Diastolic dysfunction    Paroxysmal atrial fibrillation (HCC) 02/09/2017   Chronic renal insufficiency 06/28/2013   Chest pain 06/27/2013   Essential hypertension    Mixed hyperlipidemia     Past Surgical History:  Procedure Laterality Date   ABDOMINAL HYSTERECTOMY     TAH/BSO (menorrhagia)   bunion repair     HERNIA REPAIR      OB History   No obstetric history on file.      Home Medications    Prior to Admission medications   Medication Sig Start Date End Date Taking? Authorizing Provider  amLODipine  (NORVASC) 10 MG tablet TAKE 1 TABLET BY MOUTH  DAILY 11/24/21  Yes Donita Brooks, MD  apixaban (ELIQUIS) 5 MG TABS tablet Take 1 tablet (5 mg total) by mouth 2 (two) times daily. Schedule an appointment for further refills 03/21/22  Yes O'Neal, Ronnald Ramp, MD  hydrochlorothiazide (HYDRODIURIL) 12.5 MG tablet Take 1 tablet (12.5 mg total) by mouth daily. Schedule an appointment for further refills, 1st attempt 03/20/22  Yes Croitoru, Mihai, MD  losartan (COZAAR) 100 MG tablet Take 1 tablet (100 mg total) by mouth daily. 03/03/22  Yes Donita Brooks, MD  methocarbamol (ROBAXIN) 500 MG tablet Take 1 tablet (500 mg total) by mouth 3 (three) times daily. 09/21/20  Yes Donita Brooks, MD  metoprolol tartrate (LOPRESSOR) 50 MG tablet TAKE 1 TABLET BY MOUTH 2 (TWO) TIMES DAILY. SCHEDULE AN APPOINTMENT FOR FURTHER REFILLS 04/05/22  Yes Croitoru, Mihai, MD  metoprolol tartrate (LOPRESSOR) 50 MG tablet Take 50 mg by mouth 2 (two) times daily. 12/13/21  Yes [provider]  Multiple Vitamins-Minerals (CENTRUM SILVER 50+WOMEN) TABS Take 1 tablet by mouth daily with breakfast.   Yes [provider]  neomycin-polymyxin-hydrocortisone (CORTISPORIN) OTIC solution Place 3 drops into the right ear 4 (four) times daily. 03/03/22  Yes Lynnea Ferrier  T, MD  rosuvastatin (CRESTOR) 10 MG tablet TAKE 1 TABLET BY MOUTH ONCE DAILY 03/10/22  Yes Pickard, Cammie Mcgee, MD    Family History Family History  Problem Relation Age of Onset   Kidney failure Mother    Hypertension Father    Heart disease Father        pacemaker- followedby Dr. Sallyanne Kuster   Diabetes Sister    Hypertension Sister    Diabetes Sister    Hypertension Sister    Hypertension Daughter    Hypertension Daughter    Breast cancer Daughter     Social History Social History   Tobacco Use   Smoking status: Never   Smokeless tobacco: Never  Vaping Use   Vaping Use: Never used  Substance Use Topics   Alcohol use: Yes    Comment:  occasionally   Drug use: No     Allergies   Codeine and Codeine   Review of Systems Review of Systems  Constitutional: Negative.   Respiratory: Negative.    Cardiovascular: Negative.   Musculoskeletal:  Positive for arthralgias and joint swelling. Negative for back pain, gait problem, myalgias, neck pain and neck stiffness.  Skin: Negative.      Physical Exam Triage Vital Signs ED Triage Vitals  Enc Vitals Group     BP 05/26/22 1751 (!) 155/71     Pulse Rate 05/26/22 1751 70     Resp 05/26/22 1751 20     Temp 05/26/22 1751 98.7 F (37.1 C)     Temp Source 05/26/22 1751 Oral     SpO2 05/26/22 1751 95 %     Weight --      Height --      Head Circumference --      Peak Flow --      Pain Score 05/26/22 1753 10     Pain Loc --      Pain Edu? --      Excl. in [place]? --    No data found.  Updated Vital Signs BP (!) 155/71 (BP Location: Left Arm)   Pulse 70   Temp 98.7 F (37.1 C) (Oral)   Resp 20   SpO2 95%   Visual Acuity Right Eye Distance:   Left Eye Distance:   Bilateral Distance:    Right Eye Near:   Left Eye Near:    Bilateral Near:     Physical Exam Constitutional:      Appearance: Normal appearance.  Eyes:     Extraocular Movements: Extraocular movements intact.  Pulmonary:     Effort: Pulmonary effort is normal.  Musculoskeletal:     Comments: Mild to moderate swelling along the volar aspect and ulnar aspect of the right wrist with associated tenderness, no ecchymosis or deformity present, range of motion is intact, pain elicited with supination and rotation of the wrist, 2+ radial pulse  Neurological:     Mental Status: She is alert and oriented to person, place, and time. Mental status is at baseline.  Psychiatric:        Mood and Affect: Mood normal.        Behavior: Behavior normal.      UC Treatments / Results  Labs (all labs ordered are listed, but only abnormal results are displayed) Labs Reviewed - No data to  display  EKG   Radiology No results found.  Procedures Procedures (including critical care time)  Medications Ordered in UC Medications - No data to display  Initial Impression / Assessment and  Plan / UC Course  I have reviewed the triage vital signs and the nursing notes.  Pertinent labs & imaging results that were available during my care of the patient were reviewed by me and considered in my medical decision making (see chart for details).  Right wrist pain  Etiology is most likely inflammatory, low suspicion for bone involvement due to lack of injury and therefore will defer imaging, discussed with patient, methylprednisolone injection given in office and is patient has been told to avoid NSAIDs due to her heart will prescribe diclofenac gel and lidocaine ointment for consistent use for the next 3 to 5 days, recommend ice and heat over the affected areas, elevation onto a pillow and compression, at wrist has been wrapped by this provider to be used as needed, may follow-up with primary doctor and given walker referral to orthopedics if symptoms persist or worsen Final Clinical Impressions(s) / UC Diagnoses   Final diagnoses:  None   Discharge Instructions   None    ED Prescriptions   None    PDMP not reviewed this encounter.   Valinda Hoar, [place] 05/26/22 1845

## 2022-05-29 ENCOUNTER — Ambulatory Visit (INDEPENDENT_AMBULATORY_CARE_PROVIDER_SITE_OTHER): Payer: Medicare Other | Admitting: *Deleted

## 2022-05-29 DIAGNOSIS — I480 Paroxysmal atrial fibrillation: Secondary | ICD-10-CM

## 2022-05-29 DIAGNOSIS — I10 Essential (primary) hypertension: Secondary | ICD-10-CM

## 2022-05-29 NOTE — Chronic Care Management (AMB) (Signed)
Chronic Care Management   CCM RN Visit Note  05/29/2022 Name: Christine Hogan MRN: [id number] DOB: February 05, 1946  Subjective: Christine Hogan is a 76 y.o. year old female who is a primary care patient of Pickard, Cammie Mcgee, MD. The care management team was consulted for assistance with disease management and care coordination needs.    Engaged with patient by telephone for follow up visit in response to provider referral for case management and/or care coordination services.   Consent to Services:  The patient was given information about Chronic Care Management services, agreed to services, and gave verbal consent prior to initiation of services.  Please see initial visit note for detailed documentation.   Patient agreed to services and verbal consent obtained.   Assessment: Review of patient past medical history, allergies, medications, health status, including review of consultants reports, laboratory and other test data, was performed as part of comprehensive evaluation and provision of chronic care management services.   SDOH (Social Determinants of Health) assessments and interventions performed:    CCM Care Plan  Allergies  Allergen Reactions   Codeine Nausea And Vomiting   Codeine Nausea And Vomiting and Other (See Comments)    Dizziness, also    Outpatient Encounter Medications as of 05/29/2022  Medication Sig   amLODipine (NORVASC) 10 MG tablet TAKE 1 TABLET BY MOUTH  DAILY   apixaban (ELIQUIS) 5 MG TABS tablet Take 1 tablet (5 mg total) by mouth 2 (two) times daily. Schedule an appointment for further refills   diclofenac Sodium (VOLTAREN) 1 % GEL Apply 2 g topically 4 (four) times daily.   hydrochlorothiazide (HYDRODIURIL) 12.5 MG tablet Take 1 tablet (12.5 mg total) by mouth daily. Schedule an appointment for further refills, 1st attempt   lidocaine (XYLOCAINE) 5 % ointment Apply 1 Application topically as needed.   losartan (COZAAR) 100 MG tablet Take 1 tablet (100 mg total) by  mouth daily.   methocarbamol (ROBAXIN) 500 MG tablet Take 1 tablet (500 mg total) by mouth 3 (three) times daily.   metoprolol tartrate (LOPRESSOR) 50 MG tablet TAKE 1 TABLET BY MOUTH 2 (TWO) TIMES DAILY. SCHEDULE AN APPOINTMENT FOR FURTHER REFILLS   metoprolol tartrate (LOPRESSOR) 50 MG tablet Take 50 mg by mouth 2 (two) times daily.   Multiple Vitamins-Minerals (CENTRUM SILVER 50+WOMEN) TABS Take 1 tablet by mouth daily with breakfast.   neomycin-polymyxin-hydrocortisone (CORTISPORIN) OTIC solution Place 3 drops into the right ear 4 (four) times daily.   rosuvastatin (CRESTOR) 10 MG tablet TAKE 1 TABLET BY MOUTH ONCE DAILY   No facility-administered encounter medications on file as of 05/29/2022.    Patient Active Problem List   Diagnosis Date Noted   CKD (chronic kidney disease) stage 3, GFR 30-59 ml/min (HCC)    Abnormal finding on MRI of brain 09/15/2019   Alteration of consciousness 08/26/2019   Syncope 05/26/2019   Nonrheumatic mitral (valve) insufficiency 10/08/2018   Vasovagal syncope 10/08/2018   Osteopenia    Diastolic dysfunction    Paroxysmal atrial fibrillation (HCC) 02/09/2017   Chronic renal insufficiency 06/28/2013   Chest pain 06/27/2013   Essential hypertension    Mixed hyperlipidemia     Conditions to be addressed/monitored:Atrial Fibrillation and HTN  Care Plan : RN Care Manager Plan of Care  Updates made by Kassie Mends, RN since 05/29/2022 12:00 AM  Completed 05/29/2022   Problem: No plan of care established for management of chronic disease states  (HTN, PAF) Resolved 05/29/2022  Priority: High  Long-Range Goal: Development of plan of care for chronic disease management  (HTN, PAF) Completed 05/29/2022  Start Date: 12/02/2021  Expected End Date: 05/31/2022  Priority: High  Note:   Current Barriers:  Knowledge Deficits related to plan of care for management of Atrial Fibrillation and HTN  Knowledge deficits related to self health management of  chronic afib- Pt reports her adult son lives there with her but pt states she is overall independent with all aspects of her care, has a cane "just in case" , she continues to drive, gets out to church and visits friends.    Chronic Disease Management support and education needs related to afib- pt denies any dizziness, no further episodes of syncope, reports had MRI and "everything good", wore heart monitor and "everything ok with that". No Advanced Directives in place- pt reports she received advanced directives packet Knowledge Deficits related to basic understanding of hypertension pathophysiology and self care management- pt reports checks blood pressure 1-2 x per week with readings " usually good" Reports taking all medications as prescribed. Reports sometimes she has difficulty sleeping and takes melatonin at times, does take day time naps at times. Does not adhere to provider recommendations re:  does not adhere to any special diet Patient reports went to urgent care for right wrist pain (end of last week) and received prednisone injection and states "arthritis much better, pain and swelling gone"  RNCM Clinical Goal(s):  Patient will verbalize understanding of plan for management of Atrial Fibrillation and HTN as evidenced by patient report, review of EHR and  through collaboration with RN Care manager, provider, and care team.   Interventions: 1:1 collaboration with primary care provider regarding development and update of comprehensive plan of care as evidenced by provider attestation and co-signature Inter-disciplinary care team collaboration (see longitudinal plan of care) Evaluation of current treatment plan related to  self management and patient's adherence to plan as established by provider   AFIB Interventions: (Status:  Goal on track:  Yes.) Long Term Goal   Counseled on importance of regular laboratory monitoring as prescribed Afib action plan reviewed Reinforced importance of  checking heart rate daily Pain assessment completed Reviewed upcoming scheduled appointments Reviewed plan of care with patient including case closure  Hypertension Interventions:  (Status:  Goal on track:  Yes.) Long Term Goal Last practice recorded BP readings:  BP Readings from Last 3 Encounters:  09/24/21 104/67  09/23/21 (!) 152/74  09/19/21 138/68  Most recent eGFR/CrCl:  Lab Results  Component Value Date   EGFR 33 (L) 09/19/2021    No components found for: CRCL  Evaluation of current treatment plan related to hypertension self management and patient's adherence to plan as established by provider Counseled on the importance of exercise goals with target of 150 minutes per week Advised patient, providing education and rationale, to monitor blood pressure daily and record, calling PCP for findings outside established parameters Discussed complications of poorly controlled blood pressure such as heart disease, stroke, circulatory complications, vision complications, kidney impairment, sexual dysfunction  Reinforced low sodium diet and importance of reading labs Encouraged patient to limit day time naps to help with night time sleep  Patient Goals/Self-Care Activities: Take medications as prescribed   Attend all scheduled provider appointments Call pharmacy for medication refills 3-7 days in advance of running out of medications Perform all self care activities independently  Perform IADL's (shopping, preparing meals, housekeeping, managing finances) independently Call provider office for new concerns or questions  - check  pulse (heart) rate once a day - cut down alcohol use - make a plan to exercise regularly - make a plan to eat healthy - keep all lab appointments - take medicine as prescribed check blood pressure 3 times per week write blood pressure results in a log or diary keep a blood pressure log take blood pressure log to all doctor appointments develop an action  plan for high blood pressure keep all doctor appointments take medications for blood pressure exactly as prescribed Try to do some walking, get outside on warm days Follow low sodium diet- read labels and limit fast food Check heart rate daily Case closure today       Plan:No further follow up required: case closure today  Jacqlyn Larsen [facility], BSN RN Case Manager [place] [phone]

## 2022-05-29 NOTE — Patient Instructions (Signed)
Visit Information  Thank you for taking time to visit with me today. Please don't hesitate to contact me if I can be of assistance to you before our next scheduled telephone appointment.  Following are the goals we discussed today:  Take medications as prescribed   Attend all scheduled provider appointments Call pharmacy for medication refills 3-7 days in advance of running out of medications Perform all self care activities independently  Perform IADL's (shopping, preparing meals, housekeeping, managing finances) independently Call provider office for new concerns or questions  - check pulse (heart) rate once a day - cut down alcohol use - make a plan to exercise regularly - make a plan to eat healthy - keep all lab appointments - take medicine as prescribed check blood pressure 3 times per week write blood pressure results in a log or diary keep a blood pressure log take blood pressure log to all doctor appointments develop an action plan for high blood pressure keep all doctor appointments take medications for blood pressure exactly as prescribed Try to do some walking, get outside on warm days Follow low sodium diet- read labels and limit fast food Check heart rate daily Case closure today  Please call the care guide team at [phone] if you need to cancel or reschedule your appointment.   If you are experiencing a Mental Health or Behavioral Health Crisis or need someone to talk to, please call the Suicide and Crisis Lifeline: 988 call the [place] National Suicide Prevention Lifeline: (828)174-9272 or TTY: 972-809-0464 TTY 782-299-9158) to talk to a trained counselor call 1-800-273-TALK (toll free, 24 hour hotline) go to [facility] [address] [place] [phone]) call 911   The patient verbalized understanding of instructions, educational materials, and care plan provided today and DECLINED offer to receive copy of patient  instructions, educational materials, and care plan.   Irving Shows RNC, BSN RN Case Manager [facility] [phone]

## 2022-06-05 DIAGNOSIS — I480 Paroxysmal atrial fibrillation: Secondary | ICD-10-CM

## 2022-06-05 DIAGNOSIS — I10 Essential (primary) hypertension: Secondary | ICD-10-CM | POA: Diagnosis not present

## 2022-06-13 ENCOUNTER — Other Ambulatory Visit: Payer: Self-pay | Admitting: Cardiovascular Disease

## 2022-06-13 DIAGNOSIS — I480 Paroxysmal atrial fibrillation: Secondary | ICD-10-CM

## 2022-06-14 NOTE — Telephone Encounter (Signed)
Eliquis 5mg  refill request received. Patient is 76 years old, weight-71.2kg, Crea-1.84 on 01/19/2022, Diagnosis-Afib, and last seen by 01/21/2022, NP on 09/23/2021. Dose is appropriate based on dosing criteria. Will send in refill to requested pharmacy.

## 2022-06-16 ENCOUNTER — Ambulatory Visit (INDEPENDENT_AMBULATORY_CARE_PROVIDER_SITE_OTHER): Payer: Medicare Other | Admitting: Family Medicine

## 2022-06-16 VITALS — BP 130/72 | HR 61 | Temp 98.6°F | Ht 65.0 in | Wt 161.0 lb

## 2022-06-16 DIAGNOSIS — M542 Cervicalgia: Secondary | ICD-10-CM

## 2022-06-16 DIAGNOSIS — N183 Chronic kidney disease, stage 3 unspecified: Secondary | ICD-10-CM | POA: Diagnosis not present

## 2022-06-16 DIAGNOSIS — I480 Paroxysmal atrial fibrillation: Secondary | ICD-10-CM | POA: Diagnosis not present

## 2022-06-16 DIAGNOSIS — I10 Essential (primary) hypertension: Secondary | ICD-10-CM | POA: Diagnosis not present

## 2022-06-16 MED ORDER — HYDROCHLOROTHIAZIDE 12.5 MG PO TABS: 12.5000 mg | ORAL_TABLET | Freq: Every day | ORAL | 3 refills | Status: DC

## 2022-06-16 NOTE — Progress Notes (Signed)
Subjective:    Patient ID: Christine Hogan, female    DOB: 09/10/1946, 76 y.o.   MRN: [id number]   Patient here for a checkup.  Past medical history significant for atrial fibrillation.  She is currently rate controlled with metoprolol and using Eliquis for primary prevention of stroke.  She denies any melena or hematochezia.  She does occasionally forget to take the second dose of Eliquis.  We discussed this at length today and I encouraged her to try to be consistent taking it.  She is not taking any arthritis medication.  She denies any stomach pain.  She is still drinking but she is trying to reduce this.  About 1 year ago she fell in her bedroom and injured her neck.  She states that if she turns her head from side to side she will have sharp pains in the lateral aspect of her neck on either side.  She has not had an x-ray of her neck to evaluate for any damage.  She also has a history of chronic kidney disease.  She has not been back to see her nephrologist recently.  She is overdue to recheck her creatinine.  Her blood pressure today is excellent.  She has not been taking hydrochlorothiazide Past Medical History:  Diagnosis Date   Arthritis    Atrial fibrillation ([place])    Chest pain    a. 2014: NST with no evidence of ischemia   CKD (chronic kidney disease) stage 3, GFR 30-59 ml/min (HCC)    Diastolic dysfunction    grade 2 with dyspnea on exertion   H/O echocardiogram    a. 2014: echo showing EF of 65-70% with Grade 1 DD, moderate TR, and mild to moderate MR   Hyperlipidemia    Hypertension    Near syncope    Osteopenia    Past Surgical History:  Procedure Laterality Date   ABDOMINAL HYSTERECTOMY     TAH/BSO (menorrhagia)   bunion repair     HERNIA REPAIR     Current Outpatient Medications on File Prior to Visit  Medication Sig Dispense Refill   amLODipine (NORVASC) 10 MG tablet TAKE 1 TABLET BY MOUTH  DAILY 90 tablet 3   apixaban (ELIQUIS) 5 MG TABS tablet TAKE 1 TABLET BY  MOUTH TWICE  DAILY 180 tablet 1   diclofenac Sodium (VOLTAREN) 1 % GEL Apply 2 g topically 4 (four) times daily. 50 g 0   hydrochlorothiazide (HYDRODIURIL) 12.5 MG tablet Take 1 tablet (12.5 mg total) by mouth daily. Schedule an appointment for further refills, 1st attempt 30 tablet 0   lidocaine (XYLOCAINE) 5 % ointment Apply 1 Application topically as needed. 35.44 g 0   losartan (COZAAR) 100 MG tablet Take 1 tablet (100 mg total) by mouth daily. 90 tablet 3   methocarbamol (ROBAXIN) 500 MG tablet Take 1 tablet (500 mg total) by mouth 3 (three) times daily. 10 tablet 0   metoprolol tartrate (LOPRESSOR) 50 MG tablet TAKE 1 TABLET BY MOUTH 2 (TWO) TIMES DAILY. SCHEDULE AN APPOINTMENT FOR FURTHER REFILLS 180 tablet 1   metoprolol tartrate (LOPRESSOR) 50 MG tablet Take 50 mg by mouth 2 (two) times daily.     Multiple Vitamins-Minerals (CENTRUM SILVER 50+WOMEN) TABS Take 1 tablet by mouth daily with breakfast.     neomycin-polymyxin-hydrocortisone (CORTISPORIN) OTIC solution Place 3 drops into the right ear 4 (four) times daily. 10 mL 0   rosuvastatin (CRESTOR) 10 MG tablet TAKE 1 TABLET BY MOUTH ONCE DAILY 90 tablet  3   No current facility-administered medications on file prior to visit.   Allergies  Allergen Reactions   Codeine Nausea And Vomiting   Codeine Nausea And Vomiting and Other (See Comments)    Dizziness, also   Social History   Socioeconomic History   Marital status: Divorced    Spouse name: Not on file   Number of children: 5   Years of education: 12th grade   Highest education level: Not on file  Occupational History   Occupation: Retired  Tobacco Use   Smoking status: Never   Smokeless tobacco: Never  Vaping Use   Vaping Use: Never used  Substance and Sexual Activity   Alcohol use: Yes    Comment: occasionally   Drug use: No   Sexual activity: Yes  Other Topics Concern   Not on file  Social History Narrative   ** Merged History Encounter **       Lives with  her son. Right-handed. Four living children. Occasional caffeine use.   Social Determinants of Health   Financial Resource Strain: Low Risk  (03/09/2022)   Overall Financial Resource Strain (CARDIA)    Difficulty of Paying Living Expenses: Not hard at all  Food Insecurity: No Food Insecurity (03/09/2022)   Hunger Vital Sign    Worried About Running Out of Food in the Last Year: Never true    Ran Out of Food in the Last Year: Never true  Transportation Needs: No Transportation Needs (03/09/2022)   PRAPARE - Administrator, Civil Service (Medical): No    Lack of Transportation (Non-Medical): No  Physical Activity: Inactive (03/09/2022)   Exercise Vital Sign    Days of Exercise per Week: 0 days    Minutes of Exercise per Session: 0 min  Stress: No Stress Concern Present (03/09/2022)   [employer] of Occupational Health - Occupational Stress Questionnaire    Feeling of Stress : Not at all  Social Connections: Moderately Integrated (03/09/2022)   Social Connection and Isolation Panel [NHANES]    Frequency of Communication with Friends and Family: More than three times a week    Frequency of Social Gatherings with Friends and Family: More than three times a week    Attends Religious Services: More than 4 times per year    Active Member of [employer] or Organizations: Yes    Attends Banker Meetings: More than 4 times per year    Marital Status: Divorced  Intimate Partner Violence: Not At Risk (03/09/2022)   Humiliation, Afraid, Rape, and Kick questionnaire    Fear of Current or Ex-Partner: No    Emotionally Abused: No    Physically Abused: No    Sexually Abused: No     Review of Systems  Gastrointestinal:  Positive for abdominal pain.  All other systems reviewed and are negative.     Objective:   Physical Exam Vitals reviewed.  Constitutional:      General: She is not in acute distress.    Appearance: Normal appearance. She is normal weight. She is not  ill-appearing, toxic-appearing or diaphoretic.  Eyes:     Extraocular Movements: Extraocular movements intact.     Pupils: Pupils are equal, round, and reactive to light.  Neck:     Vascular: No carotid bruit.  Cardiovascular:     Rate and Rhythm: Normal rate and regular rhythm.     Pulses: Normal pulses.     Heart sounds: Normal heart sounds. No murmur heard.  No friction rub. No gallop.  Pulmonary:     Effort: Pulmonary effort is normal. No respiratory distress.     Breath sounds: Normal breath sounds. No stridor. No wheezing, rhonchi or rales.  Abdominal:     General: Abdomen is flat. Bowel sounds are decreased. There is no distension.     Palpations: Abdomen is soft.     Tenderness: There is no abdominal tenderness. There is no guarding or rebound.     Hernia: No hernia is present.  Neurological:     General: No focal deficit present.     Mental Status: She is alert and oriented to person, place, and time. Mental status is at baseline.     Cranial Nerves: No cranial nerve deficit.     Sensory: No sensory deficit.     Motor: No weakness.     Coordination: Coordination normal.     Gait: Gait normal.     Deep Tendon Reflexes: Reflexes normal.  Psychiatric:        Mood and Affect: Mood normal.        Behavior: Behavior normal.        Thought Content: Thought content normal.        Judgment: Judgment normal.           Assessment & Plan:  Essential hypertension - Plan: CBC with Differential/Platelet, Lipid panel, COMPLETE METABOLIC PANEL WITH GFR  Paroxysmal atrial fibrillation (HCC) - Plan: CBC with Differential/Platelet, Lipid panel, COMPLETE METABOLIC PANEL WITH GFR  Stage 3 chronic kidney disease, unspecified whether stage 3a or 3b CKD (HCC) - Plan: CBC with Differential/Platelet, Lipid panel, COMPLETE METABOLIC PANEL WITH GFR  Neck pain, bilateral - Plan: DG Cervical Spine Complete I am happy with her blood pressure.  We will leave her off hydrochlorothiazide for  the time being.  She will monitor her blood pressure at home.  Ideally I like to see her blood pressure in the 130/80.  Because of the neck pain after the x-ray.  We will check her CBC and CMP.  Monitor for any anemia.  I will monitor her chronic kidney disease.  Ideally I like to keep her LDL cholesterol below 100.  Obtain an x-ray of her neck to evaluate for any evidence of degenerative disc disease or injury from the fall.  Continue to encourage abstinence from alcohol but the patient is in the precontemplative phase.

## 2022-06-17 LAB — COMPLETE METABOLIC PANEL WITH GFR
AG Ratio: 1.6 (calc) (ref 1.0–2.5)
ALT: 12 U/L (ref 6–29)
AST: 18 U/L (ref 10–35)
Albumin: 4.3 g/dL (ref 3.6–5.1)
Alkaline phosphatase (APISO): 59 U/L (ref 37–153)
BUN/Creatinine Ratio: 11 (calc) (ref 6–22)
BUN: 19 mg/dL (ref 7–25)
CO2: 30 mmol/L (ref 20–32)
Calcium: 10.4 mg/dL (ref 8.6–10.4)
Chloride: 104 mmol/L (ref 98–110)
Creat: 1.7 mg/dL — ABNORMAL HIGH (ref 0.60–1.00)
Globulin: 2.7 g/dL (calc) (ref 1.9–3.7)
Glucose, Bld: 92 mg/dL (ref 65–99)
Potassium: 4.4 mmol/L (ref 3.5–5.3)
Sodium: 140 mmol/L (ref 135–146)
Total Bilirubin: 0.8 mg/dL (ref 0.2–1.2)
Total Protein: 7 g/dL (ref 6.1–8.1)
eGFR: 31 mL/min/{1.73_m2} — ABNORMAL LOW (ref >=60)

## 2022-06-17 LAB — CBC WITH DIFFERENTIAL/PLATELET
Absolute Monocytes: 650 cells/uL (ref 200–950)
Basophils Absolute: 10 cells/uL (ref 0–200)
Basophils Relative: 0.2 %
Eosinophils Absolute: 40 cells/uL (ref 15–500)
Eosinophils Relative: 0.8 %
HCT: 35.4 % (ref 35.0–45.0)
Hemoglobin: 11.5 g/dL — ABNORMAL LOW (ref 11.7–15.5)
Lymphs Abs: 1840 cells/uL (ref 850–3900)
MCH: 29 pg (ref 27.0–33.0)
MCHC: 32.5 g/dL (ref 32.0–36.0)
MCV: 89.2 fL (ref 80.0–100.0)
MPV: 10 fL (ref 7.5–12.5)
Monocytes Relative: 13 %
Neutro Abs: 2460 cells/uL (ref 1500–7800)
Neutrophils Relative %: 49.2 %
Platelets: 192 10*3/uL (ref 140–400)
RBC: 3.97 10*6/uL (ref 3.80–5.10)
RDW: 13.7 % (ref 11.0–15.0)
Total Lymphocyte: 36.8 %
WBC: 5 10*3/uL (ref 3.8–10.8)

## 2022-06-17 LAB — LIPID PANEL
Cholesterol: 143 mg/dL (ref <200)
HDL: 49 mg/dL — ABNORMAL LOW (ref >=50)
LDL Cholesterol (Calc): 69 mg/dL (calc)
Non-HDL Cholesterol (Calc): 94 mg/dL (calc) (ref <130)
Total CHOL/HDL Ratio: 2.9 (calc) (ref <5.0)
Triglycerides: 178 mg/dL — ABNORMAL HIGH (ref <150)

## 2022-06-19 ENCOUNTER — Ambulatory Visit
Admission: RE | Admit: 2022-06-19 | Discharge: 2022-06-19 | Disposition: A | Discharge status: Home / Self Care | Payer: Medicare Other | Source: Ambulatory Visit | Attending: Family Medicine | Admitting: Family Medicine

## 2022-06-19 DIAGNOSIS — M542 Cervicalgia: Secondary | ICD-10-CM | POA: Diagnosis not present

## 2022-06-19 IMAGING — CT CT CHEST-ABD-PELV W/ CM
2 of 5 series · 14 of 36 positions shown, 16 images · IV contrast (agent unspecified)
Comparison: Abdomen and pelvis CT 03/31/2015

CLINICAL DATA: Blunt poly trauma

EXAM:
CT CHEST, ABDOMEN, AND PELVIS WITH CONTRAST
TECHNIQUE: Multidetector CT imaging of the chest, abdomen and pelvis was
performed following the standard protocol during bolus
administration of intravenous contrast.

[Series 3: cap with · axial · 0.74mm/px · z∈[-821,-301]mm · 11 of 126 slices shown, 13 images]
[im 11/126  mediastinal]
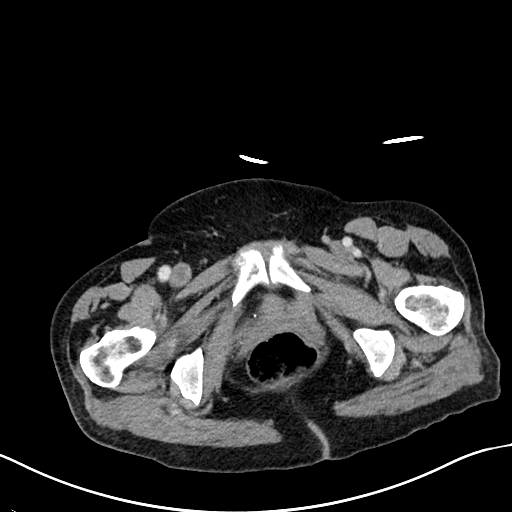
[im 11/126  bone]
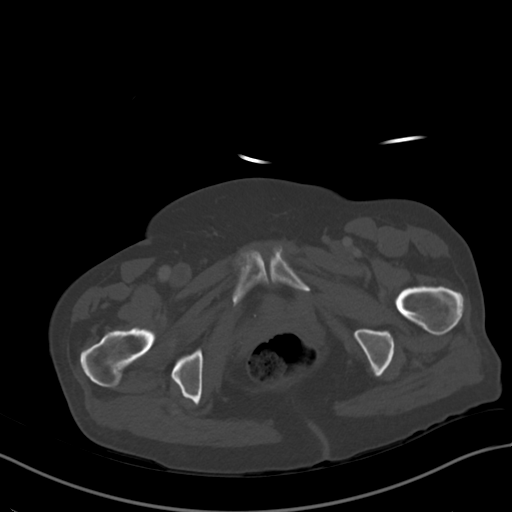
[im 21/126  mediastinal]
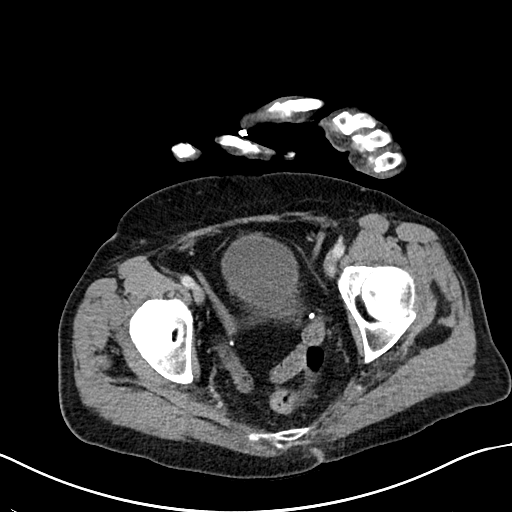
[im 32/126  mediastinal]
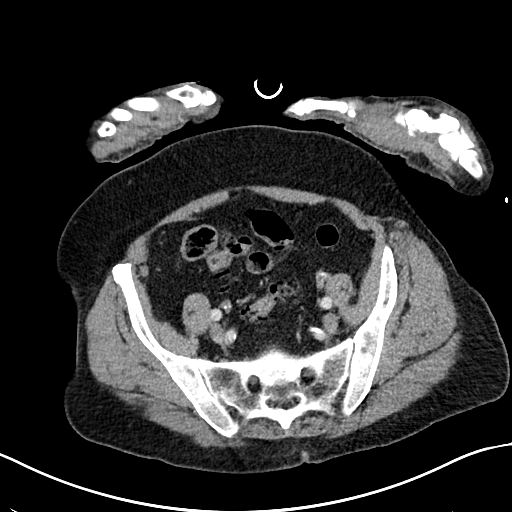
[im 42/126  mediastinal]
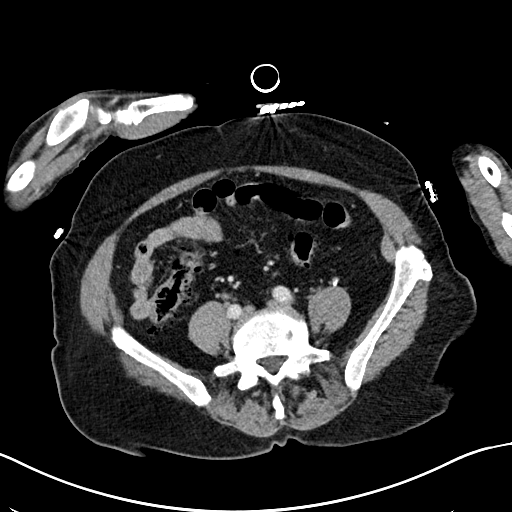
[im 53/126  mediastinal]
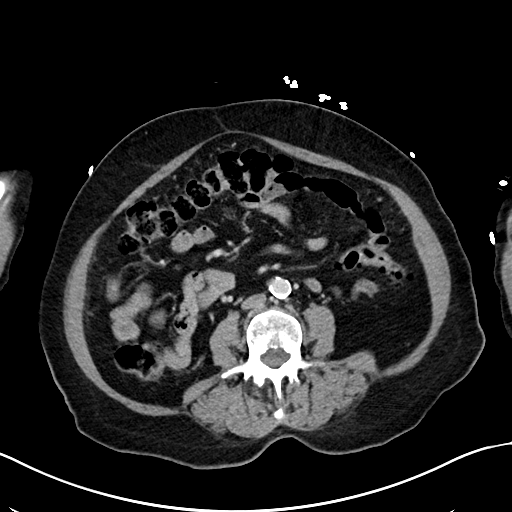
[im 63/126  mediastinal]
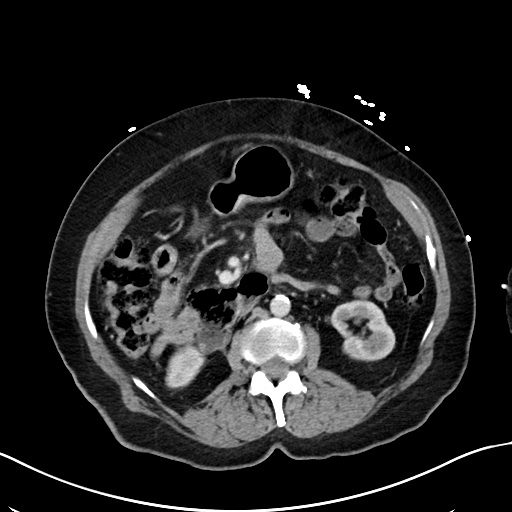
[im 73/126  mediastinal]
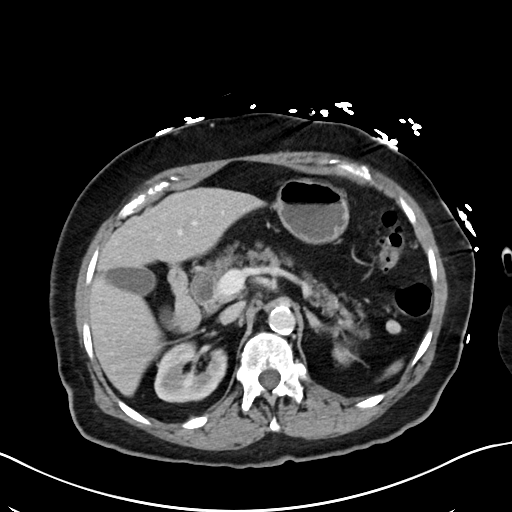
[im 84/126  mediastinal]
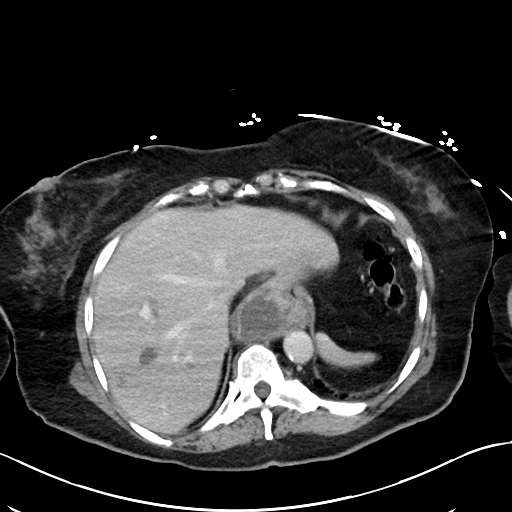
[im 94/126  mediastinal]
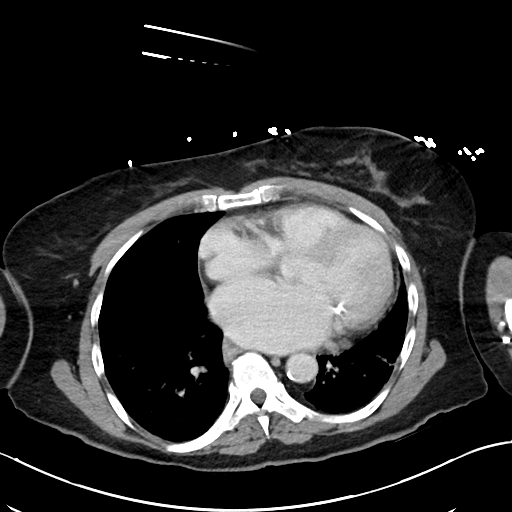
[im 94/126  bone]
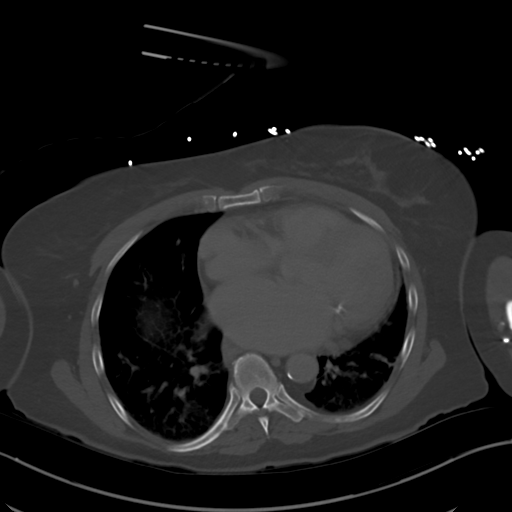
[im 105/126  mediastinal]
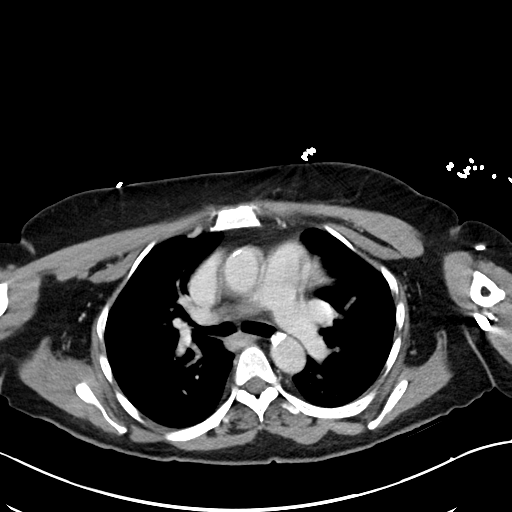
[im 115/126  mediastinal]
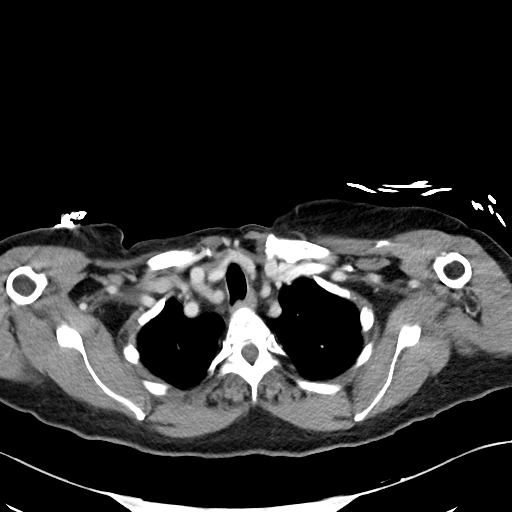

[Series 6: cor · coronal · 0.82mm/px · 3 of 101 slices shown]
[im 21/101  mediastinal]
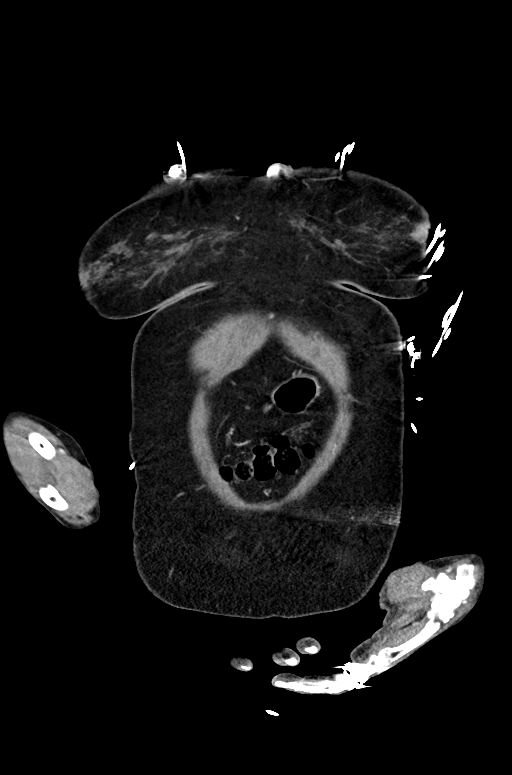
[im 41/101  mediastinal]
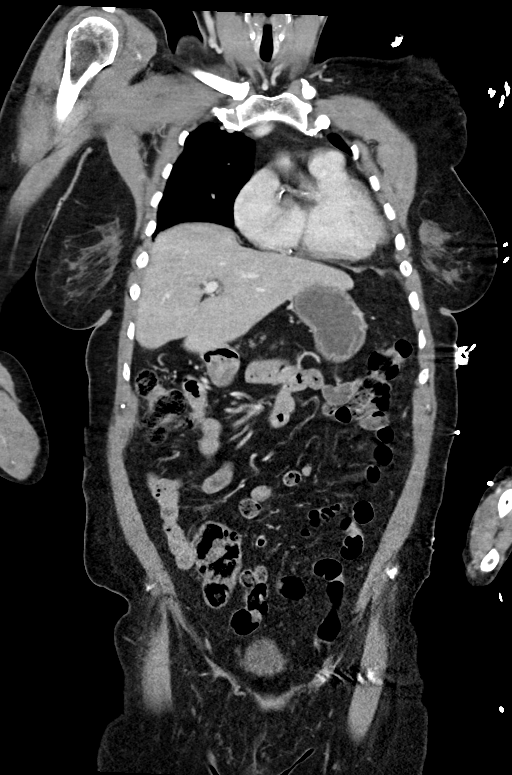
[im 61/101  mediastinal]
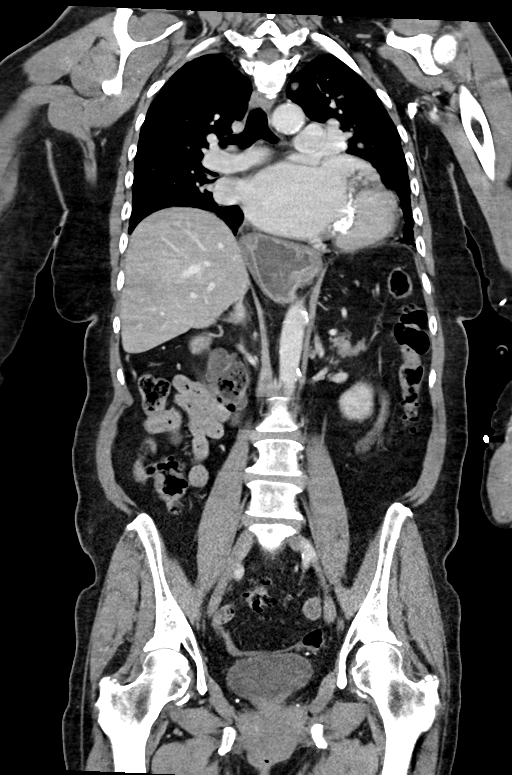

[14 of 36 positions shown; findings below may reference images not displayed]

RADIATION DOSE REDUCTION: This exam was performed according to the
departmental dose-optimization program which includes automated
exposure control, adjustment of the mA and/or kV according to
patient size and/or use of iterative reconstruction technique.

CONTRAST:  80mL OMNIPAQUE IOHEXOL 300 MG/ML  SOLN
FINDINGS: CT CHEST FINDINGS

Cardiovascular: Cardiomegaly, especially the left atrium with
pulmonary vein enlargement. Atheromatous calcification of the aorta
and coronaries. No pericardial effusion. No evidence of great vessel
injury.

Mediastinum/Nodes: No hematoma or pneumomediastinum. Moderate
sliding hiatal hernia.

Lungs/Pleura: The central airways are clear. No hemothorax,
pneumothorax, or lung contusion. Mild subpleural reticulation with
low volume suggesting this is primarily atelectasis, although some
fibrotic changes seen and at the left base.

Musculoskeletal: No acute finding

CT ABDOMEN PELVIS FINDINGS

Hepatobiliary: No hepatic injury or perihepatic hematoma.
Gallbladder is unremarkable. Cystic densities in the right lobe
liver.

Pancreas: Generalized fatty infiltration.  No acute finding

Spleen: No splenic injury or perisplenic hematoma.

Adrenals/Urinary Tract: No adrenal hemorrhage or renal injury
identified. Bladder is unremarkable.

Stomach/Bowel: Hiatal hernia is noted above. Innumerable colonic
diverticula. Uncomplicated although large distal duodenal
diverticula. No evidence of bowel injury

Vascular/Lymphatic: Atheromatous calcification of the aorta and
branch vessels. No acute finding

Reproductive: Hysterectomy

Other: No ascites or pneumoperitoneum.

Musculoskeletal: Ordinary lumbar spine degeneration. No acute
fracture or subluxation.
IMPRESSION: 1. No evidence of intrathoracic or intra-abdominal injury.
2. Cardiomegaly.
3. Extensive diverticulosis.
4.  Aortic Atherosclerosis ([id number].V).

## 2022-06-19 IMAGING — DX DG PORTABLE PELVIS
1 series · 1 of 1 positions shown · non-contrast
Comparison: None available

CLINICAL DATA: Trauma.  Fall.

EXAM:
PORTABLE PELVIS 1 VIEWS

[pelvis ap]
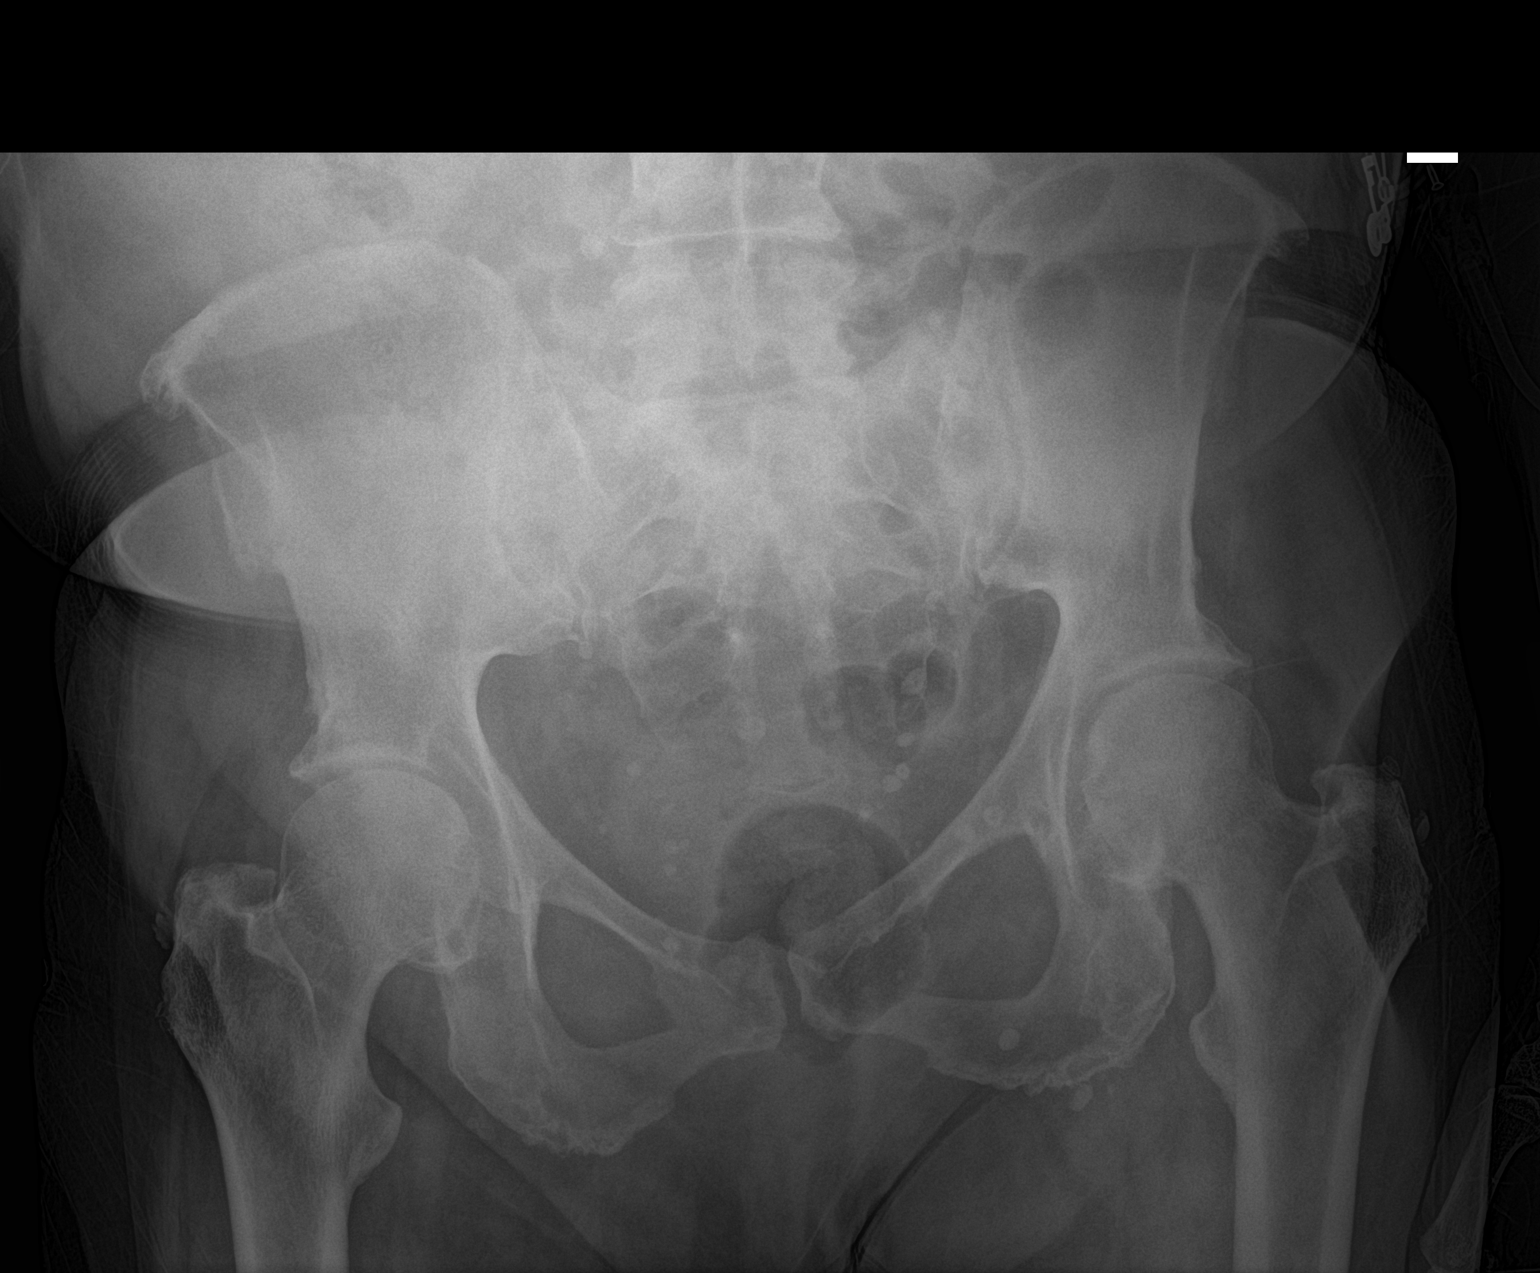

[1 of 1 positions shown; findings below may reference images not displayed]

FINDINGS: There is no evidence of pelvic fracture or diastasis. No pelvic bone
lesions are seen. Bilateral enthesophytes. Pelvic phleboliths.
IMPRESSION: No visible injury.

## 2022-06-19 IMAGING — DX DG CHEST 1V PORT
1 series · 1 of 1 positions shown · non-contrast
Comparison: 01/06/2020

CLINICAL DATA: Fall with trauma.

EXAM:
PORTABLE CHEST 1 VIEW

[chest ap]
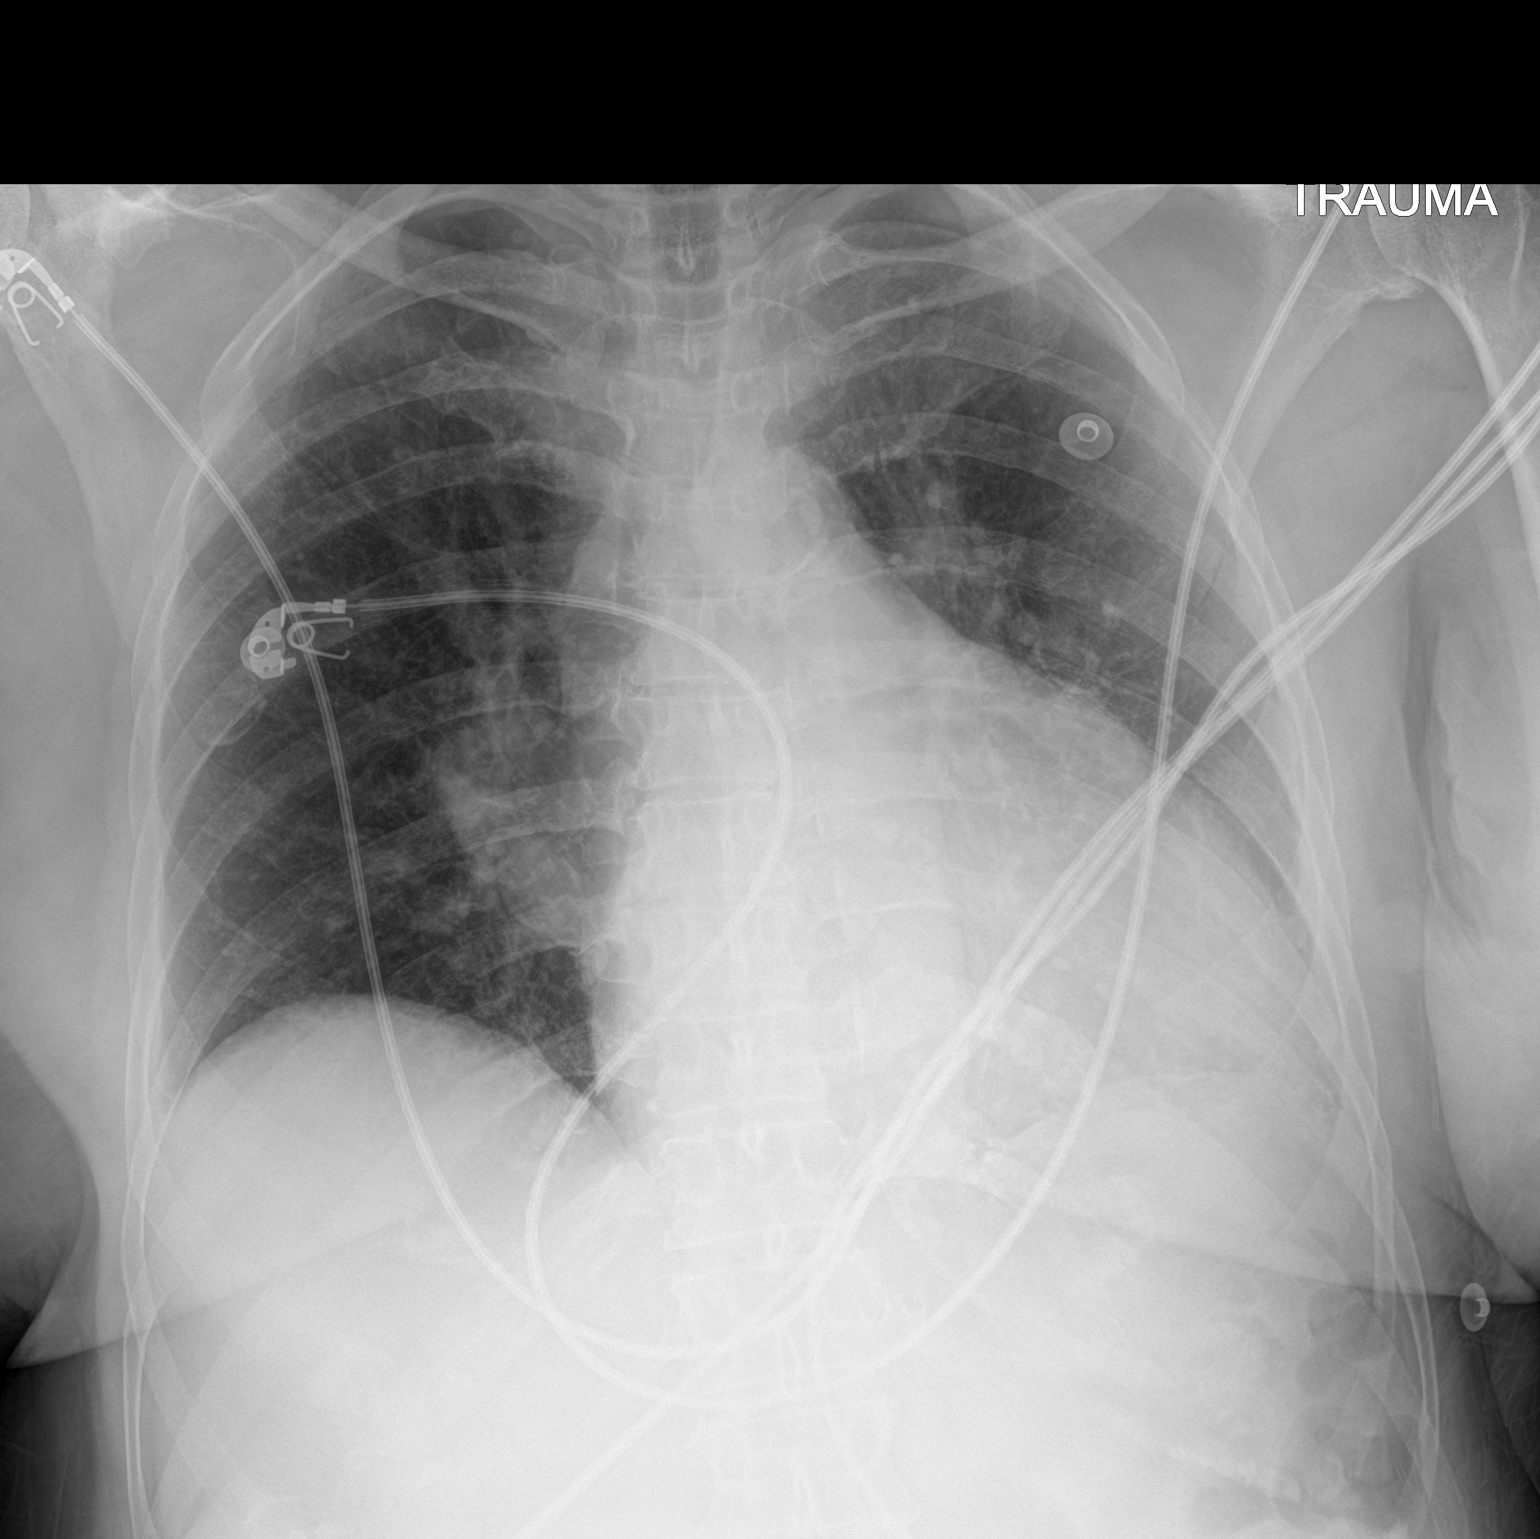

[1 of 1 positions shown; findings below may reference images not displayed]

FINDINGS: Cardiomegaly which is chronic. Hilar vascular fullness which is
stable. There is no edema, consolidation, effusion, or pneumothorax.

Artifact from EKG leads.
IMPRESSION: Stable from 9599.  No acute finding.

Cardiomegaly.

## 2022-06-19 IMAGING — CT CT CERVICAL SPINE W/O CM
4 series · 15 of 33 positions shown, 18 images · non-contrast
Comparison: 09/12/2020

CLINICAL DATA: Minor head trauma



[Series 4: c spine soft · axial · 0.35mm/px · z∈[-298,-264]mm · 2 of 103 slices shown]
[im 18/103  soft-tissue]
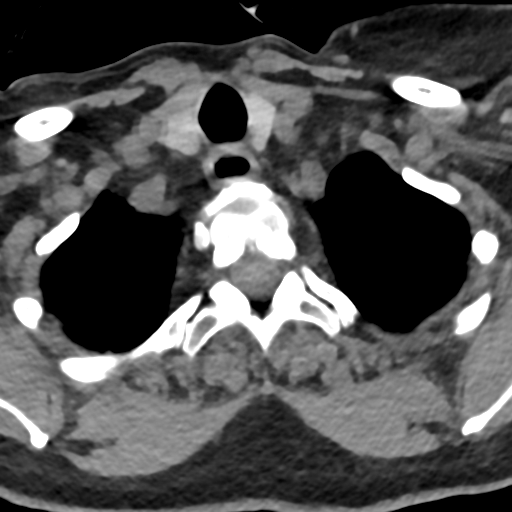
[im 35/103  soft-tissue]
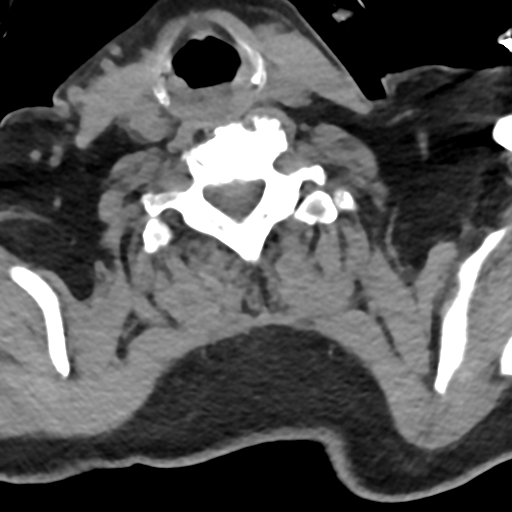

[Series 7: sag bone · sagittal · 0.40mm/px · 5 of 120 slices shown, 6 images]
[im 40/120  bone]
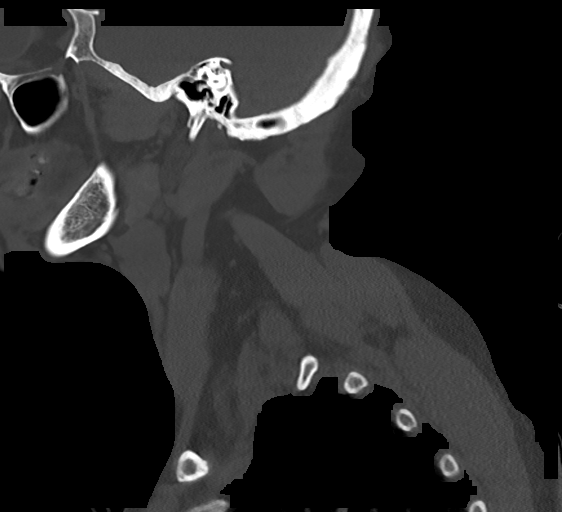
[im 50/120  bone]
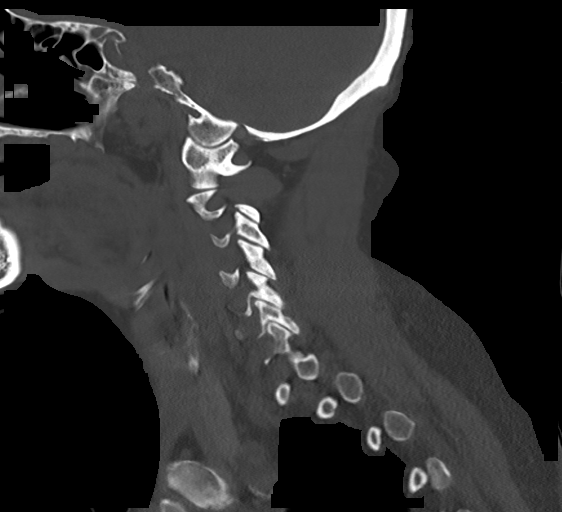
[im 60/120  soft-tissue]
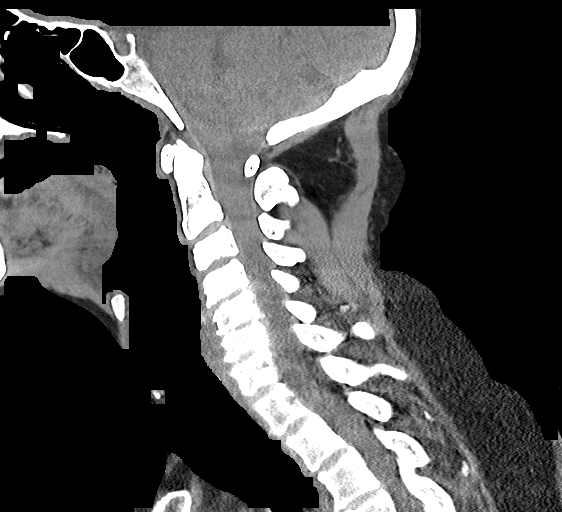
[im 60/120  bone]
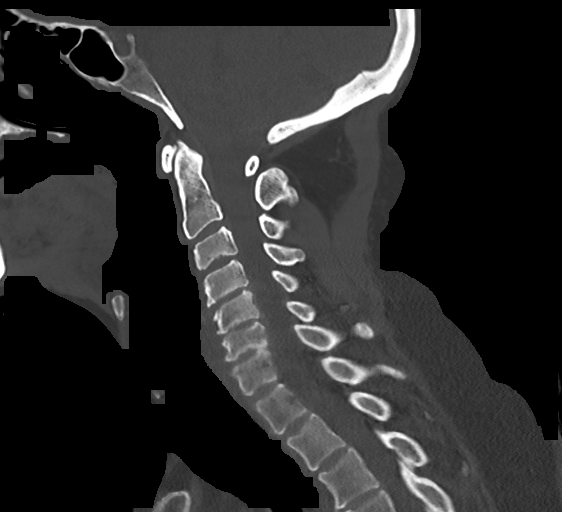
[im 70/120  bone]
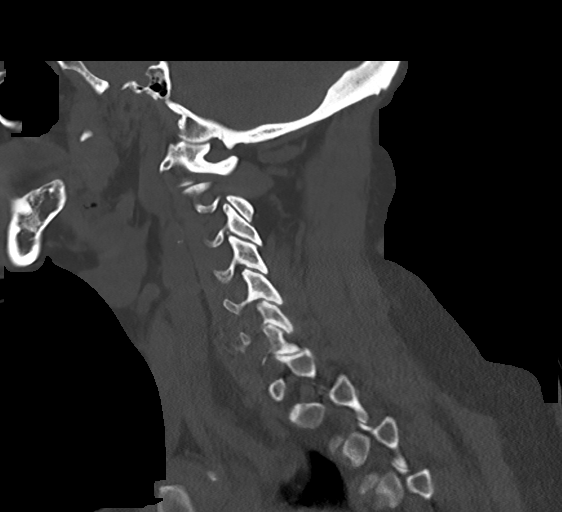
[im 80/120  bone]
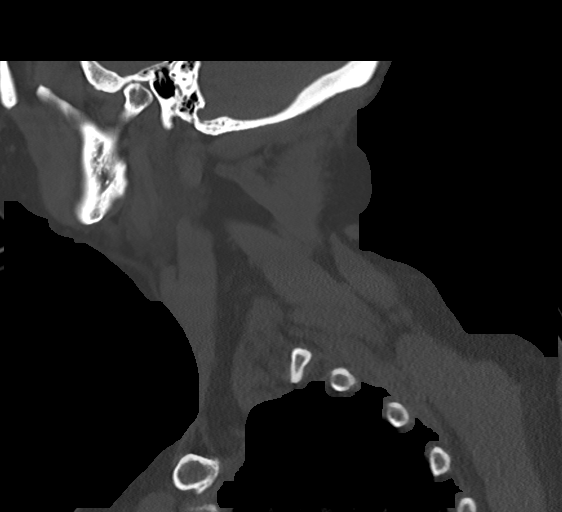

[Series 8: cor bone · coronal · 0.38mm/px · 3 of 91 slices shown]
[im 19/91  bone]
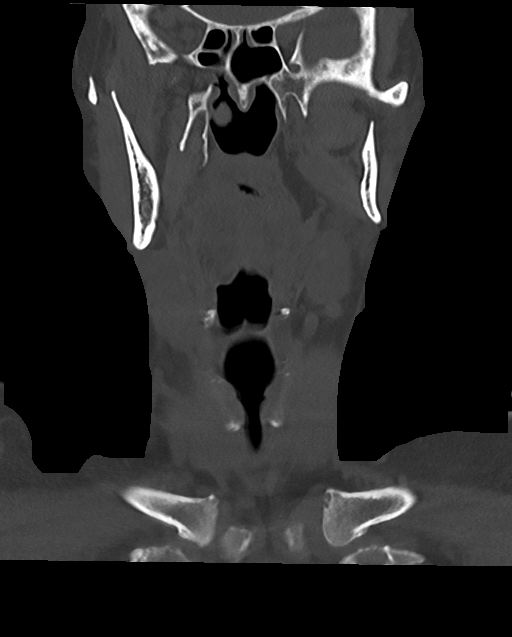
[im 37/91  bone]
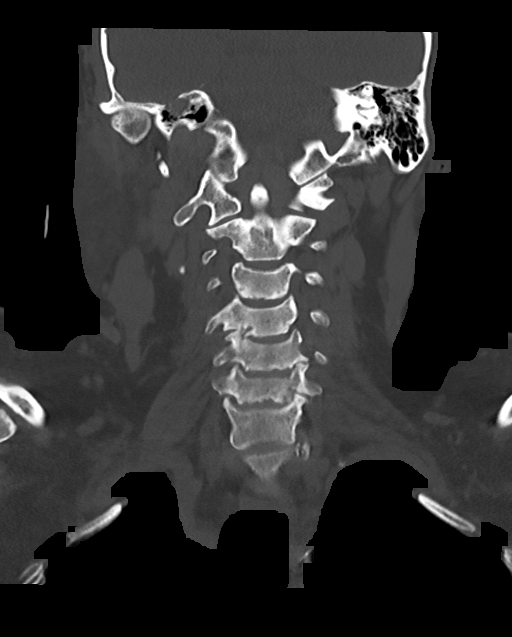
[im 55/91  bone]
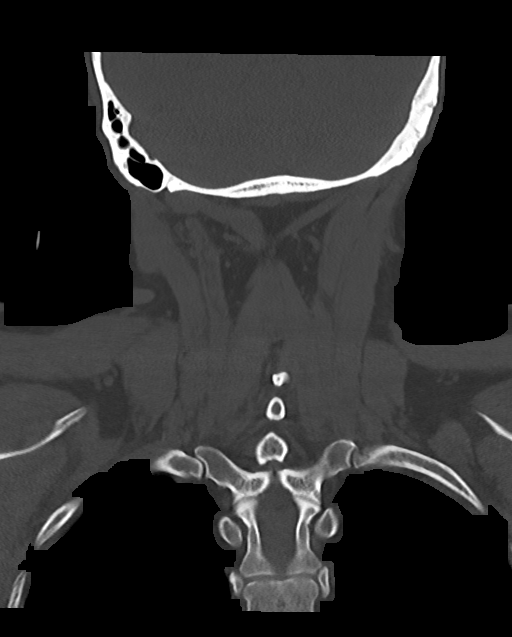

[Series 10: orthogonal axials · axial · 0.21mm/px · z∈[-328,-217]mm · 5 of 97 slices shown, 7 images]
[im 17/97  soft-tissue]
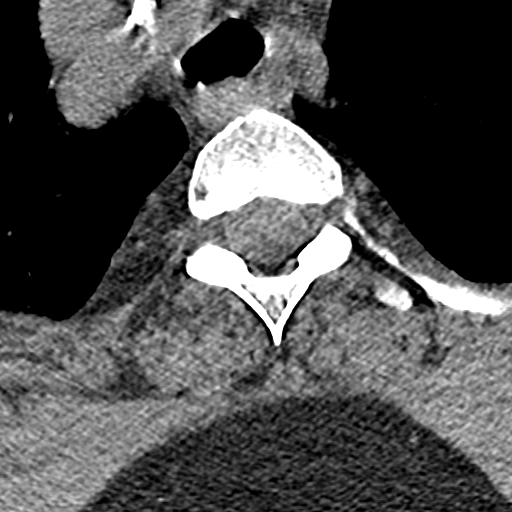
[im 17/97  bone]
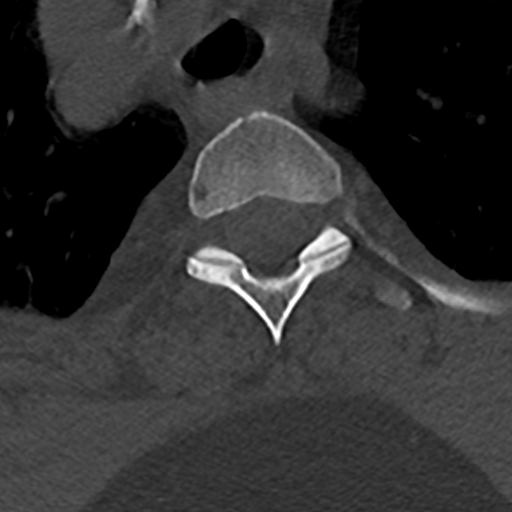
[im 33/97  bone]
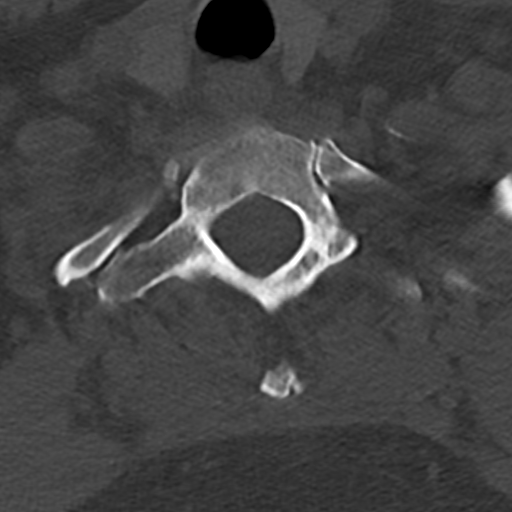
[im 49/97  bone]
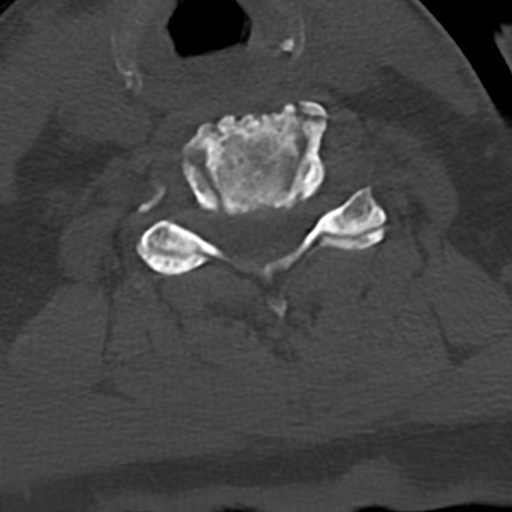
[im 65/97  bone]
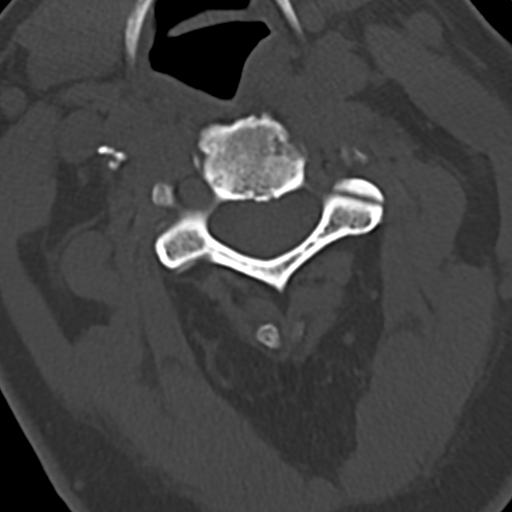
[im 81/97  soft-tissue]
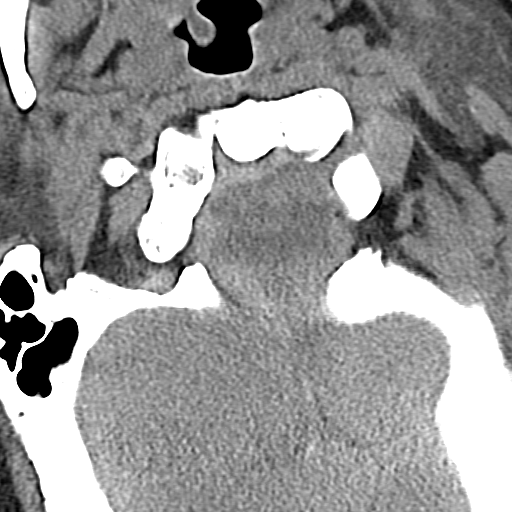
[im 81/97  bone]
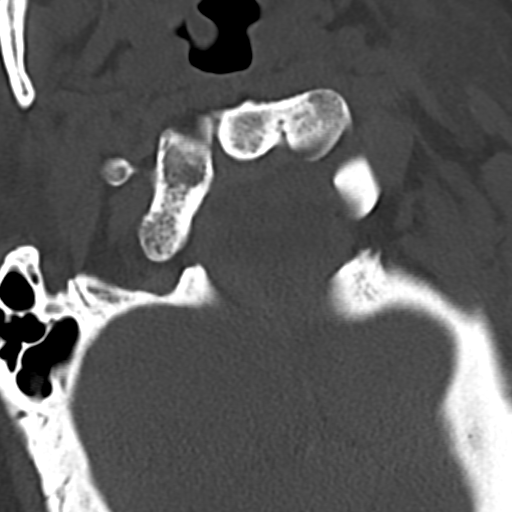

[15 of 33 positions shown; findings below may reference images not displayed]

FINDINGS: CT HEAD FINDINGS

Brain: No evidence of acute infarction, hemorrhage, hydrocephalus,
extra-axial collection or mass lesion/mass effect. Age normal brain
volume. Mild chronic small vessel ischemia.

Vascular: No hyperdense vessel or unexpected calcification.

Skull: Negative for fracture

Sinuses/Orbits: Negative

CT CERVICAL SPINE FINDINGS

Alignment: Normal.

Skull base and vertebrae: No acute fracture. No primary bone lesion
or focal pathologic process.

Soft tissues and spinal canal: No prevertebral fluid or swelling. No
visible canal hematoma.

Disc levels: Cervical spine degeneration primarily affecting disc
spaces with narrowing and ridging.

Upper chest: Negative
IMPRESSION: No evidence of intracranial or cervical spine injury.

## 2022-07-11 ENCOUNTER — Telehealth: Payer: Self-pay

## 2022-07-11 NOTE — Telephone Encounter (Signed)
Pt called, stating she is having swelling to her feet and ankles off an on x 3 days. Pt states it is not painful. Pt states when she elevates her feet, the swelling doesn't go completely down. Pt asks if she should restart her HCTZ 12.5 mg? Thank you.

## 2022-07-31 ENCOUNTER — Other Ambulatory Visit: Payer: Self-pay | Admitting: Family Medicine

## 2022-07-31 ENCOUNTER — Telehealth: Payer: Self-pay

## 2022-07-31 MED ORDER — PREDNISONE 20 MG PO TABS: ORAL_TABLET | ORAL | 0 refills | Status: DC

## 2022-07-31 NOTE — Telephone Encounter (Signed)
Pt called requesting rx for a gout flare she is having in her R big toe. Pt c/o swelling, pain and  redness since Friday. Pt states she has not had a flare like this in a long time. Pt asks if rx could be sent in for her to treat? Uses [facility] on [address] Lujean Rave you.

## 2022-08-01 NOTE — Telephone Encounter (Signed)
Pt advised and verbalized understanding of all. Mjp,lpn 

## 2022-08-19 ENCOUNTER — Other Ambulatory Visit: Payer: Self-pay | Admitting: Family Medicine

## 2022-08-21 NOTE — Telephone Encounter (Signed)
Refilled 11/24/2021 #90 3 rf Requested Prescriptions  Pending Prescriptions Disp Refills  . amLODipine (NORVASC) 10 MG tablet [Pharmacy Med Name: amLODIPine Besylate 10 MG Oral Tablet] 100 tablet 2    Sig: TAKE 1 TABLET BY MOUTH DAILY     Cardiovascular: Calcium Channel Blockers 2 Passed - 08/19/2022 11:03 PM      Passed - Last BP in normal range    BP Readings from Last 1 Encounters:  06/16/22 130/72         Passed - Last Heart Rate in normal range    Pulse Readings from Last 1 Encounters:  06/16/22 61         Passed - Valid encounter within last 6 months    Recent Outpatient Visits          5 months ago Acute swimmer's ear of right side   [place] Pickard, Cammie Mcgee, MD   7 months ago Generalized abdominal pain   [place] Susy Frizzle, MD   8 months ago Acute pain of right knee   [place] Susy Frizzle, MD   8 months ago Dermatitis   [place], NP   11 months ago Syncope, unspecified syncope type   [place] Pickard, Cammie Mcgee, MD

## 2022-09-16 ENCOUNTER — Other Ambulatory Visit: Payer: Self-pay | Admitting: Cardiovascular Disease

## 2022-09-16 DIAGNOSIS — I480 Paroxysmal atrial fibrillation: Secondary | ICD-10-CM

## 2022-09-18 NOTE — Telephone Encounter (Signed)
Prescription refill request for Eliquis received. Indication:afib Last office visit:11/22 Scr:1.7 Age: 76 Weight:73 kg  Prescription refilled

## 2022-10-25 ENCOUNTER — Other Ambulatory Visit: Payer: Self-pay | Admitting: Family Medicine

## 2022-10-25 NOTE — Telephone Encounter (Signed)
Requested medication (s) are due for refill today:   Yes  Requested medication (s) are on the active medication list:   Yes  Future visit scheduled:   No  LOV 06/16/2022   No follow up mentioned whether for 6 months or a year.   Last ordered: 11/24/2021 #90, 3 refills  Returned because didn't know whether to refill this based on being seen yearly or every 6 months.      Requested Prescriptions  Pending Prescriptions Disp Refills   amLODipine (NORVASC) 10 MG tablet [Pharmacy Med Name: amLODIPine Besylate 10 MG Oral Tablet] 70 tablet 4    Sig: TAKE 1 TABLET BY MOUTH  DAILY     Cardiovascular: Calcium Channel Blockers 2 Failed - 10/25/2022  7:34 AM      Failed - Valid encounter within last 6 months    Recent Outpatient Visits           7 months ago Acute swimmer's ear of right side   [facility] Pickard, Priscille Heidelberg, MD   9 months ago Generalized abdominal pain   [facility] Tanya Nones, Priscille Heidelberg, MD   10 months ago Acute pain of right knee   [facility] Tanya Nones, Priscille Heidelberg, MD   10 months ago Dermatitis   [facility] Valentino Nose, NP   1 year ago Syncope, unspecified syncope type   [facility] Pickard, Priscille Heidelberg, MD              Passed - Last BP in normal range    BP Readings from Last 1 Encounters:  06/16/22 130/72         Passed - Last Heart Rate in normal range    Pulse Readings from Last 1 Encounters:  06/16/22 61

## 2022-11-08 ENCOUNTER — Other Ambulatory Visit: Payer: Self-pay

## 2022-11-08 DIAGNOSIS — Z79899 Other long term (current) drug therapy: Secondary | ICD-10-CM

## 2022-11-17 ENCOUNTER — Other Ambulatory Visit: Payer: Self-pay

## 2022-11-21 ENCOUNTER — Ambulatory Visit (INDEPENDENT_AMBULATORY_CARE_PROVIDER_SITE_OTHER): Payer: 59 | Admitting: Family Medicine

## 2022-11-21 ENCOUNTER — Encounter: Payer: Self-pay | Admitting: Family Medicine

## 2022-11-21 VITALS — BP 136/82 | HR 81 | Ht 65.0 in | Wt 160.6 lb

## 2022-11-21 DIAGNOSIS — S39012A Strain of muscle, fascia and tendon of lower back, initial encounter: Secondary | ICD-10-CM | POA: Diagnosis not present

## 2022-11-21 MED ORDER — PREDNISONE 20 MG PO TABS: ORAL_TABLET | ORAL | 0 refills | Status: DC

## 2022-11-21 MED ORDER — TIZANIDINE HCL 4 MG PO TABS: 4.0000 mg | ORAL_TABLET | Freq: Four times a day (QID) | ORAL | 0 refills | Status: DC | PRN

## 2022-11-21 NOTE — Progress Notes (Signed)
Subjective:    Patient ID: Christine Hogan, female    DOB: 04/14/1946, 77 y.o.   MRN: [id number]  Patient recently injured her lower back.  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  About a week ago, she was moving several potted plants around her home.  She developed a gradual pain in her lower back roughly around the level of L5.  It hurts in a band across her lower back.  She is tender to palpation in both lumbar paraspinal muscle groups.  She has a palpable muscle spasm on the right-hand side.  Forward flexion is limited to 30 degrees.  She denies any sciatica.  She denies any bowel or bladder incontinence.  There is no tenderness to palpation of the spinous processes Past Medical History:  Diagnosis Date   Arthritis    Atrial fibrillation ([place])    Chest pain    a. 2014: NST with no evidence of ischemia   CKD (chronic kidney disease) stage 3, GFR 30-59 ml/min (HCC)    Diastolic dysfunction    grade 2 with dyspnea on exertion   H/O echocardiogram    a. 2014: echo showing EF of 65-70% with Grade 1 DD, moderate TR, and mild to moderate MR   Hyperlipidemia    Hypertension    Near syncope    Osteopenia    Past Surgical History:  Procedure Laterality Date   ABDOMINAL HYSTERECTOMY     TAH/BSO (menorrhagia)   bunion repair     HERNIA REPAIR     Current Outpatient Medications on File Prior to Visit  Medication Sig Dispense Refill   amLODipine (NORVASC) 10 MG tablet TAKE 1 TABLET BY MOUTH DAILY 70 tablet 4   diclofenac Sodium (VOLTAREN) 1 % GEL Apply 2 g topically 4 (four) times daily. 50 g 0   ELIQUIS 5 MG TABS tablet TAKE 1 TABLET BY MOUTH TWICE  DAILY 200 tablet 2   lidocaine (XYLOCAINE) 5 % ointment Apply 1 Application topically as needed. 35.44 g 0   losartan (COZAAR) 100 MG tablet Take 1 tablet (100 mg total) by mouth daily. 90 tablet 3   methocarbamol (ROBAXIN) 500 MG tablet Take 1 tablet (500 mg total) by mouth 3 (three) times  daily. 10 tablet 0   metoprolol tartrate (LOPRESSOR) 50 MG tablet Take 50 mg by mouth 2 (two) times daily.     Multiple Vitamins-Minerals (CENTRUM SILVER 50+WOMEN) TABS Take 1 tablet by mouth daily with breakfast.     neomycin-polymyxin-hydrocortisone (CORTISPORIN) OTIC solution Place 3 drops into the right ear 4 (four) times daily. 10 mL 0   predniSONE (DELTASONE) 20 MG tablet 3 tabs poqday 1-2, 2 tabs poqday 3-4, 1 tab poqday 5-6 12 tablet 0   rosuvastatin (CRESTOR) 10 MG tablet TAKE 1 TABLET BY MOUTH ONCE DAILY 90 tablet 3   No current facility-administered medications on file prior to visit.   Allergies  Allergen Reactions   Codeine Nausea And Vomiting   Codeine Nausea And Vomiting and Other (See Comments)    Dizziness, also   Social History   Socioeconomic History   Marital status: Divorced    Spouse name: Not on file   Number of children: 5   Years of education: 12th grade   Highest education level: Not on file  Occupational History   Occupation: Retired  Tobacco Use   Smoking status: Never   Smokeless tobacco: Never  Vaping Use   Vaping Use:  Never used  Substance and Sexual Activity   Alcohol use: Yes    Comment: occasionally   Drug use: No   Sexual activity: Yes  Other Topics Concern   Not on file  Social History Narrative   ** Merged History Encounter **       Lives with her son. Right-handed. Four living children. Occasional caffeine use.   Social Determinants of Health   Financial Resource Strain: Low Risk  (03/09/2022)   Overall Financial Resource Strain (CARDIA)    Difficulty of Paying Living Expenses: Not hard at all  Food Insecurity: No Food Insecurity (03/09/2022)   Hunger Vital Sign    Worried About Running Out of Food in the Last Year: Never true    Ran Out of Food in the Last Year: Never true  Transportation Needs: No Transportation Needs (03/09/2022)   PRAPARE - Hydrologist (Medical): No    Lack of Transportation  (Non-Medical): No  Physical Activity: Inactive (03/09/2022)   Exercise Vital Sign    Days of Exercise per Week: 0 days    Minutes of Exercise per Session: 0 min  Stress: No Stress Concern Present (03/09/2022)   [place]    Feeling of Stress : Not at all  Social Connections: Moderately Integrated (03/09/2022)   Social Connection and Isolation Panel [NHANES]    Frequency of Communication with Friends and Family: More than three times a week    Frequency of Social Gatherings with Friends and Family: More than three times a week    Attends Religious Services: More than 4 times per year    Active Member of [employer] or Organizations: Yes    Attends Archivist Meetings: More than 4 times per year    Marital Status: Divorced  Intimate Partner Violence: Not At Risk (03/09/2022)   Humiliation, Afraid, Rape, and Kick questionnaire    Fear of Current or Ex-Partner: No    Emotionally Abused: No    Physically Abused: No    Sexually Abused: No     Review of Systems  Gastrointestinal:  Positive for abdominal pain.  All other systems reviewed and are negative.      Objective:   Physical Exam Vitals reviewed.  Constitutional:      General: She is not in acute distress.    Appearance: Normal appearance. She is normal weight. She is not ill-appearing, toxic-appearing or diaphoretic.  Eyes:     Extraocular Movements: Extraocular movements intact.     Pupils: Pupils are equal, round, and reactive to light.  Cardiovascular:     Rate and Rhythm: Normal rate and regular rhythm.     Pulses: Normal pulses.     Heart sounds: Normal heart sounds. No murmur heard.    No friction rub. No gallop.  Pulmonary:     Effort: Pulmonary effort is normal. No respiratory distress.     Breath sounds: Normal breath sounds. No stridor. No wheezing, rhonchi or rales.  Abdominal:     General: Abdomen is flat. Bowel sounds are decreased. There is  no distension.     Palpations: Abdomen is soft.     Tenderness: There is no abdominal tenderness. There is no guarding or rebound.     Hernia: No hernia is present.  Musculoskeletal:     Lumbar back: Spasms and tenderness present. No bony tenderness. Negative right straight leg raise test and negative left straight leg raise test. Scoliosis present.  Neurological:     General: No focal deficit present.     Mental Status: She is alert and oriented to person, place, and time. Mental status is at baseline.     Cranial Nerves: No cranial nerve deficit.     Sensory: No sensory deficit.     Motor: No weakness.     Coordination: Coordination normal.     Gait: Gait normal.     Deep Tendon Reflexes: Reflexes normal.  Psychiatric:        Mood and Affect: Mood normal.        Behavior: Behavior normal.        Thought Content: Thought content normal.        Judgment: Judgment normal.           Assessment & Plan:  Strain of lumbar region, initial encounter I believe the patient strained a muscle in her lower back lifting the potted plants.  Begin a prednisone taper pack as inflammatory and add tizanidine 4 mg every 6 hours as needed for muscle spasms.  Recheck in 1 week if no better or sooner if worsening.

## 2022-11-22 ENCOUNTER — Other Ambulatory Visit: Payer: Self-pay | Admitting: Family Medicine

## 2022-11-29 ENCOUNTER — Telehealth: Payer: Self-pay | Admitting: Pharmacist

## 2022-11-29 NOTE — Progress Notes (Signed)
Care Management & Coordination Services Pharmacy Team  Reason for Encounter: Appointment Reminder  Contacted patient to confirm in office appointment with Leata Mouse PharmD, on 12/04/2022 at 9 am. Unsuccessful outreach. Left voicemail for patient to return call.    Chart review:  Recent office visits:  11/21/2022 OV (PCP) Susy Frizzle, MD; Begin a prednisone taper pack as inflammatory and add tizanidine 4 mg every 6 hours as needed for muscle spasms.   06/16/2022 OV (PCP) Susy Frizzle, MD; We will leave her off hydrochlorothiazide for the time being.  She will monitor her blood pressure at home.  Ideally I like to see her blood pressure in the 130/80.   Recent consult visits:  None  Hospital visits:  None in previous 6 months  Medications: Outpatient Encounter Medications as of 11/29/2022  Medication Sig   amLODipine (NORVASC) 10 MG tablet TAKE 1 TABLET BY MOUTH DAILY   diclofenac Sodium (VOLTAREN) 1 % GEL Apply 2 g topically 4 (four) times daily.   ELIQUIS 5 MG TABS tablet TAKE 1 TABLET BY MOUTH TWICE  DAILY   lidocaine (XYLOCAINE) 5 % ointment Apply 1 Application topically as needed.   losartan (COZAAR) 100 MG tablet Take 1 tablet (100 mg total) by mouth daily.   methocarbamol (ROBAXIN) 500 MG tablet Take 1 tablet (500 mg total) by mouth 3 (three) times daily.   metoprolol tartrate (LOPRESSOR) 50 MG tablet Take 50 mg by mouth 2 (two) times daily.   Multiple Vitamins-Minerals (CENTRUM SILVER 50+WOMEN) TABS Take 1 tablet by mouth daily with breakfast.   neomycin-polymyxin-hydrocortisone (CORTISPORIN) OTIC solution Place 3 drops into the right ear 4 (four) times daily.   predniSONE (DELTASONE) 20 MG tablet 3 tabs poqday 1-2, 2 tabs poqday 3-4, 1 tab poqday 5-6   predniSONE (DELTASONE) 20 MG tablet 3 tabs poqday 1-2, 2 tabs poqday 3-4, 1 tab poqday 5-6   rosuvastatin (CRESTOR) 10 MG tablet TAKE 1 TABLET BY MOUTH ONCE  DAILY   tiZANidine (ZANAFLEX) 4 MG tablet Take 1 tablet  (4 mg total) by mouth every 6 (six) hours as needed for muscle spasms.   No facility-administered encounter medications on file as of 11/29/2022.   Lab Results  Component Value Date/Time   HGBA1C 5.4 04/24/2019 10:48 AM    BP Readings from Last 3 Encounters:  11/21/22 136/82  06/16/22 130/72  05/26/22 (!) 155/71    Star Rating Drugs:  Losartan 100 mg last filled 09/08/2022 90 DS Rosuvastatin 10 mg last filled 09/18/2022 100 DS   Care Gaps: Annual wellness visit in last year? Yes  If Diabetic: Last eye exam / retinopathy screening: Last diabetic foot exam:   Future Appointments  Date Time Provider [place]  12/04/2022  9:00 AM Edythe Clarity, [facility] None  12/18/2022  8:00 AM [facility]  12/25/2022 10:30 AM Susy Frizzle, MD [facility]  03/15/2023  9:00 AM [facility]   April D Calhoun, [place] Pharmacist Assistant [phone]

## 2022-11-29 NOTE — Progress Notes (Signed)
Care Management & Coordination Services Pharmacy Note  12/04/2022 Name:  Christine Hogan MRN:  [id number] DOB:  1945/11/09  Summary: PharmD initial visit.  [name].  Does forget afternoon doses of medication sometimes.  Counseled on adherence.  Working on setting alarms, etc.  Some swelling in right foot.  Recommendations/Changes made from today's visit: No changes to meds.  Monitor sweling - if it gets painful or warm come in to see PCP  Follow up plan: FU 6 months CMA to check BP 3 months   Subjective: Christine Hogan is an 77 y.o. year old female who is a primary patient of Pickard, Priscille Heidelberg, MD.  The care coordination team was consulted for assistance with disease management and care coordination needs.    Engaged with patient by telephone for initial visit.  Recent office visits:  11/21/2022 OV (PCP) Christine Brooks, MD; Begin a prednisone taper pack as inflammatory and add tizanidine 4 mg every 6 hours as needed for muscle spasms.    06/16/2022 OV (PCP) Christine Brooks, MD; We will leave her off hydrochlorothiazide for the time being.  She will monitor her blood pressure at home.  Ideally I like to see her blood pressure in the 130/80.    Recent consult visits:  None   Hospital visits:  None in previous 6 months   Objective:  Lab Results  Component Value Date   CREATININE 1.70 (H) 06/16/2022   BUN 19 06/16/2022   EGFR 31 (L) 06/16/2022   GFRNONAA 32 (L) 01/13/2022   GFRAA 35 (L) 09/21/2020   NA 140 06/16/2022   K 4.4 06/16/2022   CALCIUM 10.4 06/16/2022   CO2 30 06/16/2022   GLUCOSE 92 06/16/2022    Lab Results  Component Value Date/Time   HGBA1C 5.4 04/24/2019 10:48 AM    Last diabetic Eye exam: No results found for: "HMDIABEYEEXA"  Last diabetic Foot exam: No results found for: "HMDIABFOOTEX"   Lab Results  Component Value Date   CHOL 143 06/16/2022   HDL 49 (L) 06/16/2022   LDLCALC 69 06/16/2022    TRIG 178 (H) 06/16/2022   CHOLHDL 2.9 06/16/2022       Latest Ref Rng & Units 06/16/2022    2:45 PM 01/19/2022    3:58 PM 01/13/2022    3:45 AM  Hepatic Function  Total Protein 6.1 - 8.1 g/dL 7.0  7.3  7.6   Albumin 3.5 - 5.0 g/dL   4.2   AST 10 - 35 U/L 18  22  22    ALT 6 - 29 U/L 12  20  24    Alk Phosphatase 38 - 126 U/L   59   Total Bilirubin 0.2 - 1.2 mg/dL 0.8  0.9  0.7     Lab Results  Component Value Date/Time   TSH 1.770 09/23/2021 09:22 AM   TSH 2.783 05/07/2014 08:52 AM       Latest Ref Rng & Units 06/16/2022    2:45 PM 01/19/2022    3:58 PM 01/13/2022    3:56 AM  CBC  WBC 3.8 - 10.8 Thousand/uL 5.0  4.7    Hemoglobin 11.7 - 15.5 g/dL 01/21/2022  03/15/2022  37.1   Hematocrit 35.0 - 45.0 % 35.4  37.5  42.0   Platelets 140 - 400 Thousand/uL 192  174      Lab Results  Component Value Date/Time   VITAMINB12 444 09/24/2020 08:37 AM   VITAMINB12  589 08/25/2019 08:57 AM    Clinical ASCVD: Yes  The 10-year ASCVD risk score (Arnett DK, et al., 2019) is: 11.5%   Values used to calculate the score:     Age: 77 years     Sex: Female     Is Non-Hispanic African American: Yes     Diabetic: No     Tobacco smoker: No     Systolic Blood Pressure: 528 mmHg     Is BP treated: Yes     HDL Cholesterol: 49 mg/dL     Total Cholesterol: 143 mg/dL        11/21/2022    9:14 AM 03/09/2022    9:14 AM 07/01/2021    2:12 PM  Depression screen PHQ 2/9  Decreased Interest 0 0 0  Down, Depressed, Hopeless 1 0 0  PHQ - 2 Score 1 0 0     Social History   Tobacco Use  Smoking Status Never  Smokeless Tobacco Never   BP Readings from Last 3 Encounters:  11/21/22 136/82  06/16/22 130/72  05/26/22 (!) 155/71   Pulse Readings from Last 3 Encounters:  11/21/22 81  06/16/22 61  05/26/22 70   Wt Readings from Last 3 Encounters:  11/21/22 160 lb 9.6 oz (72.8 kg)  06/16/22 161 lb (73 kg)  03/09/22 157 lb (71.2 kg)   BMI Readings from Last 3 Encounters:  11/21/22 26.73 kg/m  06/16/22  26.79 kg/m  03/09/22 26.13 kg/m    Allergies  Allergen Reactions   Codeine Nausea And Vomiting   Codeine Nausea And Vomiting and Other (See Comments)    Dizziness, also    Medications Reviewed Today     Reviewed by Edythe Clarity, [facility] (Pharmacist) on 12/04/22 at 1140  Med List Status: <None>   Medication Order Taking? Sig Documenting Provider Last Dose Status Informant  amLODipine (NORVASC) 10 MG tablet [id number] Yes TAKE 1 TABLET BY MOUTH DAILY Susy Frizzle, MD Taking Active   diclofenac Sodium (VOLTAREN) 1 % GEL [id number]  Apply 2 g topically 4 (four) times daily. Hans Eden, NP  Active   ELIQUIS 5 MG TABS tablet [id number] Yes TAKE 1 TABLET BY MOUTH TWICE  DAILY O'Neal, Cassie Freer, MD Taking Active   lidocaine (XYLOCAINE) 5 % ointment [id number]  Apply 1 Application topically as needed. Hans Eden, NP  Active   losartan (COZAAR) 100 MG tablet [id number] Yes Take 1 tablet (100 mg total) by mouth daily. Susy Frizzle, MD Taking Active   methocarbamol (ROBAXIN) 500 MG tablet [id number] No Take 1 tablet (500 mg total) by mouth 3 (three) times daily.  Patient not taking: Reported on 12/04/2022   Susy Frizzle, MD Not Taking Active   metoprolol tartrate (LOPRESSOR) 50 MG tablet [id number] Yes Take 50 mg by mouth 2 (two) times daily. [provider] Taking Active Self, Pharmacy Records  Multiple Vitamins-Minerals (CENTRUM SILVER 50+WOMEN) TABS [id number]  Take 1 tablet by mouth daily with breakfast. [provider]  Active Self  neomycin-polymyxin-hydrocortisone (CORTISPORIN) OTIC solution [id number] No Place 3 drops into the right ear 4 (four) times daily.  Patient not taking: Reported on 12/04/2022   Susy Frizzle, MD Not Taking Active   rosuvastatin (CRESTOR) 10 MG tablet [id number] Yes TAKE 1 TABLET BY MOUTH ONCE  DAILY Susy Frizzle, MD Taking Active   tiZANidine (ZANAFLEX) 4 MG tablet [id number] No Take 1 tablet (4 mg total) by mouth  every 6 (six) hours as needed for muscle  spasms.  Patient not taking: Reported on 12/04/2022   Christine Brooks, MD Not Taking Active             SDOH:  (Social Determinants of Health) assessments and interventions performed: Yes Financial Resource Strain: Low Risk  (12/04/2022)   Overall Financial Resource Strain (CARDIA)    Difficulty of Paying Living Expenses: Not hard at all   Food Insecurity: No Food Insecurity (12/04/2022)   Hunger Vital Sign    Worried About Running Out of Food in the Last Year: Never true    Ran Out of Food in the Last Year: Never true    SDOH Interventions    Flowsheet Row Clinical Support from 03/09/2022 in [facility] Chronic Care Management from 07/01/2021 in [facility] Clinical Support from 03/04/2021 in [facility]  SDOH Interventions     Food Insecurity Interventions Intervention Not Indicated Intervention Not Indicated Intervention Not Indicated  Housing Interventions Intervention Not Indicated -- Intervention Not Indicated  Transportation Interventions Intervention Not Indicated Intervention Not Indicated Intervention Not Indicated  Financial Strain Interventions Intervention Not Indicated -- Intervention Not Indicated  Physical Activity Interventions Other (Comments)  [Limited due to arthritis in knee. Encouraged chair exercises] -- Intervention Not Indicated  Stress Interventions Intervention Not Indicated -- Intervention Not Indicated  Social Connections Interventions Intervention Not Indicated -- Intervention Not Indicated       Medication Assistance: None required.  Patient affirms current coverage meets needs.  Medication Access: Within the past 30 days, how often has patient missed a dose of medication? 2-3 Is a pillbox or other method used to improve adherence? Yes  Factors that may affect medication adherence? no barriers identified Are meds synced by current pharmacy? No  Are meds  delivered by current pharmacy? Yes  Does patient experience delays in picking up medications due to transportation concerns? No   Upstream Services Reviewed: Is patient disadvantaged to use [facility]?: Yes  Current Rx insurance plan: [facility] Name and location of Current pharmacy:  [id number] [place], [place] - [address] AT [facility] [address] [place] [address] Phone: [phone] Fax: [phone]  [facility] - [place], [place] - [address] [place] [address] Phone: [phone] Fax: [phone]  [facility]) - [place], [place] - [address] [place] [address] [place] [address] Phone: [phone] Fax: [phone]  [facility] services reviewed with patient today?: Yes  Patient requests to transfer care to [facility] Pharmacy?: No  Reason patient declined to change pharmacies: Disadvantaged due to insurance/mail order  Compliance/Adherence/Medication fill history: Care Gaps: None noted  Star-Rating Drugs: Losartan 100 mg last filled 09/08/2022 90 DS Rosuvastatin 10 mg last filled 09/18/2022 100 DS   Assessment/Plan Hypertension (BP goal <140/90) -Controlled -Current treatment: Amlodipine 10mg  Appropriate, Effective, Safe, Accessible Losartan 100mg  Appropriate, Effective, Safe, Accessible Metoprolol tartrate 50mg  bid Appropriate, Effective, Safe, Accessible -Medications previously tried: HCTZ  -Current home readings: checks once weekly, reports approx 130/80 no specific logs -Current dietary habits: tries to limit salt imtake -Current exercise habits: minimal due to arthritis pain, unable to walk long distances -Denies hypotensive/hypertensive symptoms -Educated on BP goals and benefits of medications for prevention of heart attack, stroke and kidney damage; Daily salt intake goal < 2300 mg; Exercise goal of 150  minutes per week; Importance of home blood pressure monitoring; -Counseled to  monitor BP at home once weekly, document, and provide log at future appointments -Recommended to continue current medication Record BP and report consistent readings above goal.  Fo cus on trying to remember evening dose of meds.  Hyperlipidemia: (LDL goal < 70) -Controlled, based on last lipid panel -Current treatment: Rosuvastatin 10mg  Appropriate, Effective, Safe, Accessible -Medications previously tried: none noted  -Current dietary patterns: see HTN -Current exercise habits: see HTN -Educated on Cholesterol goals;  Benefits of statin for ASCVD risk reduction; Importance of limiting foods high in cholesterol; -Recommended to continue current medication LDL controlled, tolerating med.  She takes in the morning but this is better for her due to increased adherence,  No need to make changes  Atrial Fibrillation (Goal: prevent stroke and major bleeding) -Controlled -CHADSVASC: 3 -Current treatment: Rate control: Metoprolol tartrate 50mg  bid Appropriate, Effective, Safe, Accessible Anticoagulation: Eliquis 5mg  BID Appropriate, Effective, Safe, Accessible -Medications previously tried: none noted -Home BP and HR readings: controlled per report  -Counseled on increased risk of stroke due to Afib and benefits of anticoagulation for stroke prevention; importance of adherence to anticoagulant exactly as prescribed; bleeding risk associated with Eliquis and importance of self-monitoring for signs/symptoms of bleeding; -Recommended to continue current medication Sometimes misses afternoon doses, stressed importance of adherence with these meds on stroke risk reduction.  She has alarms set and uses Alexa.  Plans to continue to work on this.  Copays affordable.  Osteopenia (Goal Prevent fracture) -Controlled -Last DEXA Scan: 2019   T-Score femoral neck: -1.1   10-year probability of major osteoporotic fracture:  6.4%  10-year probability of hip fracture: 1.0% -Patient is not a candidate for pharmacologic treatment -Current treatment  None -Medications previously tried: none  -Recommend [phone] units of vitamin D daily. Recommend 1200 mg of calcium daily from dietary and supplemental sources. -Recommended continue current management.  Due for updated DEXA scan.  One on order she just needs to call to make the appt.  Beverly Milch, PharmD, CPP Clinical Pharmacist Practitioner [place] [phone]

## 2022-12-04 ENCOUNTER — Ambulatory Visit: Payer: 59 | Admitting: Pharmacist

## 2022-12-18 ENCOUNTER — Other Ambulatory Visit: Payer: 59

## 2022-12-18 DIAGNOSIS — N183 Chronic kidney disease, stage 3 unspecified: Secondary | ICD-10-CM | POA: Diagnosis not present

## 2022-12-18 DIAGNOSIS — I10 Essential (primary) hypertension: Secondary | ICD-10-CM | POA: Diagnosis not present

## 2022-12-18 DIAGNOSIS — E782 Mixed hyperlipidemia: Secondary | ICD-10-CM

## 2022-12-19 LAB — CBC WITH DIFFERENTIAL/PLATELET
Absolute Monocytes: 401 {cells}/uL (ref 200–950)
Basophils Absolute: 20 {cells}/uL (ref 0–200)
Basophils Relative: 0.6 %
Eosinophils Absolute: 102 {cells}/uL (ref 15–500)
Eosinophils Relative: 3 %
HCT: 37.7 % (ref 35.0–45.0)
Hemoglobin: 12.3 g/dL (ref 11.7–15.5)
Lymphs Abs: 2312 {cells}/uL (ref 850–3900)
MCH: 29.5 pg (ref 27.0–33.0)
MCHC: 32.6 g/dL (ref 32.0–36.0)
MCV: 90.4 fL (ref 80.0–100.0)
MPV: 9.1 fL (ref 7.5–12.5)
Monocytes Relative: 11.8 %
Neutro Abs: 564 {cells}/uL — ABNORMAL LOW (ref 1500–7800)
Neutrophils Relative %: 16.6 %
Platelets: 208 Thousand/uL (ref 140–400)
RBC: 4.17 Million/uL (ref 3.80–5.10)
RDW: 13.5 % (ref 11.0–15.0)
Total Lymphocyte: 68 %
WBC: 3.4 Thousand/uL — ABNORMAL LOW (ref 3.8–10.8)

## 2022-12-19 LAB — COMPLETE METABOLIC PANEL WITH GFR
AG Ratio: 1.5 (calc) (ref 1.0–2.5)
ALT: 17 U/L (ref 6–29)
AST: 26 U/L (ref 10–35)
Albumin: 4.2 g/dL (ref 3.6–5.1)
Alkaline phosphatase (APISO): 58 U/L (ref 37–153)
BUN/Creatinine Ratio: 15 (calc) (ref 6–22)
BUN: 26 mg/dL — ABNORMAL HIGH (ref 7–25)
CO2: 26 mmol/L (ref 20–32)
Calcium: 9.8 mg/dL (ref 8.6–10.4)
Chloride: 109 mmol/L (ref 98–110)
Creat: 1.71 mg/dL — ABNORMAL HIGH (ref 0.60–1.00)
Globulin: 2.8 g/dL (calc) (ref 1.9–3.7)
Glucose, Bld: 96 mg/dL (ref 65–99)
Potassium: 4.6 mmol/L (ref 3.5–5.3)
Sodium: 144 mmol/L (ref 135–146)
Total Bilirubin: 0.5 mg/dL (ref 0.2–1.2)
Total Protein: 7 g/dL (ref 6.1–8.1)
eGFR: 31 mL/min/{1.73_m2} — ABNORMAL LOW (ref >=60)

## 2022-12-19 LAB — LIPID PANEL
Cholesterol: 142 mg/dL (ref <200)
HDL: 52 mg/dL (ref >=50)
LDL Cholesterol (Calc): 60 mg/dL
Non-HDL Cholesterol (Calc): 90 mg/dL (ref <130)
Total CHOL/HDL Ratio: 2.7 (calc) (ref <5.0)
Triglycerides: 241 mg/dL — ABNORMAL HIGH (ref <150)

## 2022-12-25 ENCOUNTER — Encounter: Payer: Self-pay | Admitting: Family Medicine

## 2022-12-25 ENCOUNTER — Ambulatory Visit (INDEPENDENT_AMBULATORY_CARE_PROVIDER_SITE_OTHER): Payer: 59 | Admitting: Family Medicine

## 2022-12-25 VITALS — BP 116/58 | HR 97 | Temp 97.9°F | Ht 65.0 in | Wt 162.0 lb

## 2022-12-25 DIAGNOSIS — Z0001 Encounter for general adult medical examination with abnormal findings: Secondary | ICD-10-CM | POA: Diagnosis not present

## 2022-12-25 DIAGNOSIS — Z1211 Encounter for screening for malignant neoplasm of colon: Secondary | ICD-10-CM | POA: Diagnosis not present

## 2022-12-25 DIAGNOSIS — Z780 Asymptomatic menopausal state: Secondary | ICD-10-CM

## 2022-12-25 DIAGNOSIS — Z1231 Encounter for screening mammogram for malignant neoplasm of breast: Secondary | ICD-10-CM

## 2022-12-25 DIAGNOSIS — Z0000 Encounter for general adult medical examination without abnormal findings: Secondary | ICD-10-CM

## 2022-12-25 DIAGNOSIS — I10 Essential (primary) hypertension: Secondary | ICD-10-CM | POA: Diagnosis not present

## 2022-12-25 DIAGNOSIS — N183 Chronic kidney disease, stage 3 unspecified: Secondary | ICD-10-CM | POA: Diagnosis not present

## 2022-12-25 NOTE — Progress Notes (Signed)
Subjective:    Patient ID: Christine Hogan, female    DOB: [birth year], 77 y.o.   MRN: [id number]  HPI Patient is a very pleasant 77 year old African-American female here today for a physical exam.  Pneumovax 23 and Prevnar 13 up-to-date.  She is due for the shingles vaccine today along with a COVID vaccination.  She is already had her flu shot.  I recommended both the shingles and COVID.  She is overdue for colon cancer screening.  She refuses a colonoscopy but will consent to Cologuard.  Has been more than 5 years since she had a bone density test.  Her last mammogram was 2 years ago. Past Medical History:  Diagnosis Date   Arthritis    Atrial fibrillation ([place])    Chest pain    a. 2014: NST with no evidence of ischemia   CKD (chronic kidney disease) stage 3, GFR 30-59 ml/min (HCC)    Diastolic dysfunction    grade 2 with dyspnea on exertion   H/O echocardiogram    a. 2014: echo showing EF of 65-70% with Grade 1 DD, moderate TR, and mild to moderate MR   Hyperlipidemia    Hypertension    Near syncope    Osteopenia    Past Surgical History:  Procedure Laterality Date   ABDOMINAL HYSTERECTOMY     TAH/BSO (menorrhagia)   bunion repair     HERNIA REPAIR     Current Outpatient Medications on File Prior to Visit  Medication Sig Dispense Refill   amLODipine (NORVASC) 10 MG tablet TAKE 1 TABLET BY MOUTH DAILY 70 tablet 4   diclofenac Sodium (VOLTAREN) 1 % GEL Apply 2 g topically 4 (four) times daily. 50 g 0   ELIQUIS 5 MG TABS tablet TAKE 1 TABLET BY MOUTH TWICE  DAILY 200 tablet 2   lidocaine (XYLOCAINE) 5 % ointment Apply 1 Application topically as needed. 35.44 g 0   losartan (COZAAR) 100 MG tablet Take 1 tablet (100 mg total) by mouth daily. 90 tablet 3   metoprolol tartrate (LOPRESSOR) 50 MG tablet Take 50 mg by mouth 2 (two) times daily.     Multiple Vitamins-Minerals (CENTRUM SILVER 50+WOMEN) TABS Take 1 tablet by mouth daily with breakfast.     neomycin-polymyxin-hydrocortisone  (CORTISPORIN) OTIC solution Place 3 drops into the right ear 4 (four) times daily. 10 mL 0   rosuvastatin (CRESTOR) 10 MG tablet TAKE 1 TABLET BY MOUTH ONCE  DAILY 100 tablet 2   No current facility-administered medications on file prior to visit.   Allergies  Allergen Reactions   Codeine Nausea And Vomiting   Codeine Nausea And Vomiting and Other (See Comments)    Dizziness, also   Social History   Socioeconomic History   Marital status: Divorced    Spouse name: Not on file   Number of children: 5   Years of education: 12th grade   Highest education level: Not on file  Occupational History   Occupation: Retired  Tobacco Use   Smoking status: Never   Smokeless tobacco: Never  Vaping Use   Vaping Use: Never used  Substance and Sexual Activity   Alcohol use: Yes    Comment: occasionally   Drug use: No   Sexual activity: Yes  Other Topics Concern   Not on file  Social History Narrative   ** Merged History Encounter **       Lives with her son. Right-handed. Four living children. Occasional caffeine use.   Social Determinants of  Health   Financial Resource Strain: Low Risk  (12/04/2022)   Overall Financial Resource Strain (CARDIA)    Difficulty of Paying Living Expenses: Not hard at all  Food Insecurity: No Food Insecurity (12/04/2022)   Hunger Vital Sign    Worried About Running Out of Food in the Last Year: Never true    Ran Out of Food in the Last Year: Never true  Transportation Needs: No Transportation Needs (03/09/2022)   PRAPARE - Hydrologist (Medical): No    Lack of Transportation (Non-Medical): No  Physical Activity: Inactive (03/09/2022)   Exercise Vital Sign    Days of Exercise per Week: 0 days    Minutes of Exercise per Session: 0 min  Stress: No Stress Concern Present (03/09/2022)   [place]    Feeling of Stress : Not at all  Social Connections: Moderately  Integrated (03/09/2022)   Social Connection and Isolation Panel [NHANES]    Frequency of Communication with Friends and Family: More than three times a week    Frequency of Social Gatherings with Friends and Family: More than three times a week    Attends Religious Services: More than 4 times per year    Active Member of [employer] or Organizations: Yes    Attends Archivist Meetings: More than 4 times per year    Marital Status: Divorced  Intimate Partner Violence: Not At Risk (03/09/2022)   Humiliation, Afraid, Rape, and Kick questionnaire    Fear of Current or Ex-Partner: No    Emotionally Abused: No    Physically Abused: No    Sexually Abused: No     Review of Systems  All other systems reviewed and are negative.      Objective:   Physical Exam Vitals reviewed.  Constitutional:      General: She is not in acute distress.    Appearance: Normal appearance. She is normal weight. She is not ill-appearing, toxic-appearing or diaphoretic.  Eyes:     Extraocular Movements: Extraocular movements intact.     Pupils: Pupils are equal, round, and reactive to light.  Neck:     Vascular: No carotid bruit.  Cardiovascular:     Rate and Rhythm: Normal rate and regular rhythm.     Pulses: Normal pulses.     Heart sounds: Normal heart sounds. No murmur heard.    No friction rub. No gallop.  Pulmonary:     Effort: Pulmonary effort is normal. No respiratory distress.     Breath sounds: Normal breath sounds. No stridor. No wheezing, rhonchi or rales.  Abdominal:     General: Abdomen is flat. Bowel sounds are normal.     Palpations: Abdomen is soft.  Neurological:     General: No focal deficit present.     Mental Status: She is alert and oriented to person, place, and time. Mental status is at baseline.     Cranial Nerves: No cranial nerve deficit.     Sensory: No sensory deficit.     Motor: No weakness.     Coordination: Coordination normal.     Gait: Gait normal.     Deep Tendon  Reflexes: Reflexes normal.  Psychiatric:        Mood and Affect: Mood normal.        Behavior: Behavior normal.        Thought Content: Thought content normal.        Judgment: Judgment normal.  Assessment & Plan:  Encounter for screening mammogram for malignant neoplasm of breast - Plan: MM Digital Screening  Colon cancer screening - Plan: Cologuard  Postmenopausal estrogen deficiency - Plan: DG Bone Density  Stage 3 chronic kidney disease, unspecified whether stage 3a or 3b CKD ([place])  Encounter for Medicare annual wellness exam  Essential hypertension Continue to encourage this from alcohol.  Recommended a COVID booster as well as the shingles vaccine.  Schedule mammogram.  Schedule bone density test.  Schedule Cologuard testing.  To screen for colon cancer.  She does have chronic kidney disease however this is stable.  Her blood pressure is well-controlled.  Her cholesterol is excellent except for triglycerides which are over 200.  Recommended starting fish oil 2000 mg daily.  Patient denies any falls or memory loss.  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

## 2023-01-08 ENCOUNTER — Other Ambulatory Visit: Payer: Self-pay | Admitting: Family Medicine

## 2023-01-08 ENCOUNTER — Telehealth: Payer: Self-pay | Admitting: Family Medicine

## 2023-01-08 MED ORDER — PREDNISONE 20 MG PO TABS: ORAL_TABLET | ORAL | 0 refills | Status: DC

## 2023-01-08 NOTE — Telephone Encounter (Signed)
Gout flare up; requesting Rx.   [id number] [place] [id number]: [phone] Fax: [phone] #: [id number]   Please advise patient when script sent in at [phone]

## 2023-01-30 ENCOUNTER — Telehealth: Payer: Self-pay

## 2023-01-30 NOTE — Telephone Encounter (Signed)
LM advising pt that Handicap Placard Form is ready for pickup. Form in drawer. Mjp,lpn

## 2023-02-14 ENCOUNTER — Telehealth: Payer: Self-pay | Admitting: Pharmacist

## 2023-02-14 NOTE — Progress Notes (Signed)
Care Management & Coordination Services Pharmacy Team  Reason for Encounter: Hypertension  Contacted patient to discuss hypertension disease state. {US HC Outreach:28874}    Current antihypertensive regimen:  ***  Patient verbally confirms she is taking the above medications as directed. {yes/no:20286}  How often are you checking your Blood Pressure? {CHL HP BP Monitoring Frequency:[phone]}  she checks her blood pressure {timing:25218} {before/after:25217} taking her medication.  Current home BP readings: ***  DATE:             BP               PULSE   Wrist or arm cuff: Caffeine intake: Salt intake: OTC medications including pseudoephedrine or NSAIDs?  Any readings above 180/100? {yes/no:20286} If yes any symptoms of hypertensive emergency? {hypertensive emergency symptoms:25354}  What recent interventions/DTPs have been made by any provider to improve Blood Pressure control since last CPP Visit: ***  Any recent hospitalizations or ED visits since last visit with CPP? {yes/no:20286}  What diet changes have been made to improve Blood Pressure Control?  ***  What exercise is being done to improve your Blood Pressure Control?  ***  Adherence Review: Is the patient currently on ACE/ARB medication? {yes/no:20286} Does the patient have >5 day gap between last estimated fill dates? {yes/no:20286}  Star Rating Drugs:  Losartan 100 mg last filled 01/13/2023 90 DS Rosuvastatin 10 mg last filled 09/18/2022 100 DS   Chart Updates: Recent office visits:  12/25/2022 OV (PCP) Donita Brooks, MD; Recommended starting fish oil 2000 mg daily. She does complain of some neuropathy in her feet and I recommended trying over-the-counter B12 as well as abstinence from alcohol   Recent consult visits:  None since PharmD visit  Hospital visits:  None in previous 6 months  Medications: Outpatient Encounter Medications as of 02/14/2023  Medication Sig   predniSONE (DELTASONE) 20  MG tablet 3 tabs poqday 1-2, 2 tabs poqday 3-4, 1 tab poqday 5-6   amLODipine (NORVASC) 10 MG tablet TAKE 1 TABLET BY MOUTH DAILY   diclofenac Sodium (VOLTAREN) 1 % GEL Apply 2 g topically 4 (four) times daily.   ELIQUIS 5 MG TABS tablet TAKE 1 TABLET BY MOUTH TWICE  DAILY   lidocaine (XYLOCAINE) 5 % ointment Apply 1 Application topically as needed.   losartan (COZAAR) 100 MG tablet Take 1 tablet (100 mg total) by mouth daily.   metoprolol tartrate (LOPRESSOR) 50 MG tablet Take 50 mg by mouth 2 (two) times daily.   Multiple Vitamins-Minerals (CENTRUM SILVER 50+WOMEN) TABS Take 1 tablet by mouth daily with breakfast.   neomycin-polymyxin-hydrocortisone (CORTISPORIN) OTIC solution Place 3 drops into the right ear 4 (four) times daily.   rosuvastatin (CRESTOR) 10 MG tablet TAKE 1 TABLET BY MOUTH ONCE  DAILY   No facility-administered encounter medications on file as of 02/14/2023.    Recent Office Vitals: BP Readings from Last 3 Encounters:  12/25/22 (!) 116/58  11/21/22 136/82  06/16/22 130/72   Pulse Readings from Last 3 Encounters:  12/25/22 97  11/21/22 81  06/16/22 61    Wt Readings from Last 3 Encounters:  12/25/22 162 lb (73.5 kg)  11/21/22 160 lb 9.6 oz (72.8 kg)  06/16/22 161 lb (73 kg)     Kidney Function Lab Results  Component Value Date/Time   CREATININE 1.71 (H) 12/18/2022 08:13 AM   CREATININE 1.70 (H) 06/16/2022 02:45 PM   GFRNONAA 32 (L) 01/13/2022 03:45 AM   GFRNONAA 30 (L) 09/21/2020 12:42 PM   GFRAA  35 (L) 09/21/2020 12:42 PM       Latest Ref Rng & Units 12/18/2022    8:13 AM 06/16/2022    2:45 PM 01/19/2022    3:58 PM  BMP  Glucose 65 - 99 mg/dL 96  92  77   BUN 7 - 25 mg/dL 26  19  26    Creatinine 0.60 - 1.00 mg/dL 5.46  5.68  1.27   BUN/Creat Ratio 6 - 22 (calc) 15  11  14    Sodium 135 - 146 mmol/L 144  140  140   Potassium 3.5 - 5.3 mmol/L 4.6  4.4  4.2   Chloride 98 - 110 mmol/L 109  104  104   CO2 20 - 32 mmol/L 26  30  27    Calcium 8.6 - 10.4  mg/dL 9.8  51.7  00.1      Future Appointments  Date Time Provider Department Center  03/15/2023  9:00 AM [facility]-ANNUAL WELLNESS VISIT [facility]  06/05/2023  3:30 PM Willa Frater L, RPH [facility] None  06/15/2023  7:00 AM [facility] MM 3 [facility]  06/15/2023  7:30 AM [facility] DX DEXA 1 [facility]   April D Calhoun, [facility] Clinical Pharmacist Assistant [phone]

## 2023-03-15 NOTE — Progress Notes (Deleted)
Subjective:   Christine Hogan is a 77 y.o. female who presents for Medicare Annual (Subsequent) preventive examination.  Review of Systems    ***       Objective:    There were no vitals filed for this visit. There is no height or weight on file to calculate BMI.     03/09/2022    9:18 AM 07/01/2021    2:29 PM 03/04/2021    3:50 PM 09/12/2020   12:57 PM 05/13/2020    2:09 PM 01/02/2016   10:53 AM  Advanced Directives  Does Patient Have a Medical Advance Directive? No No No No No No  Would patient like information on creating a medical advance directive? No - Patient declined Yes (MAU/Ambulatory/Procedural Areas - Information given) No - Patient declined   No - patient declined information    Current Medications (verified) Outpatient Encounter Medications as of 03/15/2023  Medication Sig   predniSONE (DELTASONE) 20 MG tablet 3 tabs poqday 1-2, 2 tabs poqday 3-4, 1 tab poqday 5-6   amLODipine (NORVASC) 10 MG tablet TAKE 1 TABLET BY MOUTH DAILY   diclofenac Sodium (VOLTAREN) 1 % GEL Apply 2 g topically 4 (four) times daily.   ELIQUIS 5 MG TABS tablet TAKE 1 TABLET BY MOUTH TWICE  DAILY   lidocaine (XYLOCAINE) 5 % ointment Apply 1 Application topically as needed.   losartan (COZAAR) 100 MG tablet Take 1 tablet (100 mg total) by mouth daily.   metoprolol tartrate (LOPRESSOR) 50 MG tablet Take 50 mg by mouth 2 (two) times daily.   Multiple Vitamins-Minerals (CENTRUM SILVER 50+WOMEN) TABS Take 1 tablet by mouth daily with breakfast.   neomycin-polymyxin-hydrocortisone (CORTISPORIN) OTIC solution Place 3 drops into the right ear 4 (four) times daily.   rosuvastatin (CRESTOR) 10 MG tablet TAKE 1 TABLET BY MOUTH ONCE  DAILY   No facility-administered encounter medications on file as of 03/15/2023.    Allergies (verified) Codeine and Codeine   History: Past Medical History:  Diagnosis Date   Arthritis    Atrial fibrillation (HCC)    Chest pain    a. 2014: NST with no evidence of  ischemia   CKD (chronic kidney disease) stage 3, GFR 30-59 ml/min (HCC)    Diastolic dysfunction    grade 2 with dyspnea on exertion   H/O echocardiogram    a. 2014: echo showing EF of 65-70% with Grade 1 DD, moderate TR, and mild to moderate MR   Hyperlipidemia    Hypertension    Near syncope    Osteopenia    Past Surgical History:  Procedure Laterality Date   ABDOMINAL HYSTERECTOMY     TAH/BSO (menorrhagia)   bunion repair     HERNIA REPAIR     Family History  Problem Relation Age of Onset   Kidney failure Mother    Hypertension Father    Heart disease Father        pacemaker- followedby Dr. Royann Shivers   Diabetes Sister    Hypertension Sister    Diabetes Sister    Hypertension Sister    Hypertension Daughter    Hypertension Daughter    Breast cancer Daughter    Social History   Socioeconomic History   Marital status: Divorced    Spouse name: Not on file   Number of children: 5   Years of education: 12th grade   Highest education level: Not on file  Occupational History   Occupation: Retired  Tobacco Use   Smoking status: Never  Smokeless tobacco: Never  Vaping Use   Vaping Use: Never used  Substance and Sexual Activity   Alcohol use: Yes    Comment: occasionally   Drug use: No   Sexual activity: Yes  Other Topics Concern   Not on file  Social History Narrative   ** Merged History Encounter **       Lives with her son. Right-handed. Four living children. Occasional caffeine use.   Social Determinants of Health   Financial Resource Strain: Low Risk  (12/04/2022)   Overall Financial Resource Strain (CARDIA)    Difficulty of Paying Living Expenses: Not hard at all  Food Insecurity: No Food Insecurity (12/04/2022)   Hunger Vital Sign    Worried About Running Out of Food in the Last Year: Never true    Ran Out of Food in the Last Year: Never true  Transportation Needs: No Transportation Needs (03/09/2022)   PRAPARE - Scientist, research (physical sciences) (Medical): No    Lack of Transportation (Non-Medical): No  Physical Activity: Inactive (03/09/2022)   Exercise Vital Sign    Days of Exercise per Week: 0 days    Minutes of Exercise per Session: 0 min  Stress: No Stress Concern Present (03/09/2022)   [employer] of Occupational Health - Occupational Stress Questionnaire    Feeling of Stress : Not at all  Social Connections: Moderately Integrated (03/09/2022)   Social Connection and Isolation Panel [NHANES]    Frequency of Communication with Friends and Family: More than three times a week    Frequency of Social Gatherings with Friends and Family: More than three times a week    Attends Religious Services: More than 4 times per year    Active Member of [employer] or Organizations: Yes    Attends Engineer, structural: More than 4 times per year    Marital Status: Divorced    Tobacco Counseling Counseling given: Not Answered   Clinical Intake:                 Diabetic?No          Activities of Daily Living     No data to display          Patient Care Team: Donita Brooks, MD as PCP - General (Family Medicine) Croitoru, Rachelle Hora, MD as PCP - Cardiology (Cardiology) Tanya Nones Priscille Heidelberg, MD (Family Medicine)  Indicate any recent Medical Services you may have received from other than [facility] providers in the past year (date may be approximate).     Assessment:   This is a routine wellness examination for Christine Hogan.  Hearing/Vision screen No results found.  Dietary issues and exercise activities discussed:     Goals Addressed   None    Depression Screen    12/25/2022   10:25 AM 11/21/2022    9:14 AM 03/09/2022    9:14 AM 07/01/2021    2:12 PM 03/04/2021    3:54 PM 12/24/2017    9:34 AM 12/24/2017    9:30 AM  PHQ 2/9 Scores  PHQ - 2 Score 0 1 0 0 0 0 0    Fall Risk    12/25/2022   10:25 AM 11/21/2022    9:14 AM 03/09/2022    9:21 AM 07/01/2021    2:11 PM 03/04/2021    3:52 PM  Fall Risk    Falls in the past year? 0 0 0 1 1  Number falls in past yr: 0 0 0 0 0  Injury with Fall? 0 0 0 1 0  Risk for fall due to : No Fall Risks No Fall Risks Impaired balance/gait  Impaired balance/gait  Follow up Falls prevention discussed Falls prevention discussed Falls prevention discussed  Falls evaluation completed;Falls prevention discussed    FALL RISK PREVENTION PERTAINING TO THE HOME:  Any stairs in or around the home? {YES/NO:21197} If so, are there any without handrails? {YES/NO:21197} Home free of loose throw rugs in walkways, pet beds, electrical cords, etc? {YES/NO:21197} Adequate lighting in your home to reduce risk of falls? {YES/NO:21197}  ASSISTIVE DEVICES UTILIZED TO PREVENT FALLS:  Life alert? {YES/NO:21197} Use of a cane, walker or w/c? {YES/NO:21197} Grab bars in the bathroom? {YES/NO:21197} Shower chair or bench in shower? {YES/NO:21197} Elevated toilet seat or a handicapped toilet? {YES/NO:21197}  TIMED UP AND GO:  Was the test performed? No . Telephonic visit   Cognitive Function:        03/09/2022    9:23 AM  6CIT Screen  What Year? 0 points  What month? 0 points  What time? 0 points  Count back from 20 0 points  Months in reverse 0 points  Repeat phrase 2 points  Total Score 2 points    Immunizations Immunization History  Administered Date(s) Administered   Influenza,inj,Quad PF,6+ Mos 12/24/2017, 09/22/2018   PFIZER(Purple Top)SARS-COV-2 Vaccination 12/13/2019, 01/03/2020, 09/02/2020   Pneumococcal Conjugate-13 05/12/2014   Pneumococcal Polysaccharide-23 12/24/2017   Tdap 01/02/2016    TDAP status: Up to date  Flu Vaccine status: Up to date  Pneumococcal vaccine status: Up to date  Covid-19 vaccine status: Information provided on how to obtain vaccines.   Qualifies for Shingles Vaccine? Yes   Zostavax completed No   Shingrix Completed?: No.    Education has been provided regarding the importance of this vaccine. Patient has been  advised to call insurance company to determine out of pocket expense if they have not yet received this vaccine. Advised may also receive vaccine at local pharmacy or Health Dept. Verbalized acceptance and understanding.  Screening Tests Health Maintenance  Topic Date Due   Zoster Vaccines- Shingrix (1 of 2) Never done   COLONOSCOPY (Pts 45-15yrs Insurance coverage will need to be confirmed)  Never done   COVID-19 Vaccine (4 - 2023-24 season) 07/07/2022   INFLUENZA VACCINE  06/07/2023   Medicare Annual Wellness (AWV)  12/26/2023   DTaP/Tdap/Td (2 - Td or Tdap) 01/01/2026   Pneumonia Vaccine 72+ Years old  Completed   DEXA SCAN  Completed   HPV VACCINES  Aged Out   Hepatitis C Screening  Discontinued    Health Maintenance  Health Maintenance Due  Topic Date Due   Zoster Vaccines- Shingrix (1 of 2) Never done   COLONOSCOPY (Pts 45-47yrs Insurance coverage will need to be confirmed)  Never done   COVID-19 Vaccine (4 - 2023-24 season) 07/07/2022    {Colorectal cancer screening:[id number]}  Mammogram status: Ordered and scheduled for 06/15/23. Pt provided with contact info and advised to call to schedule appt.   Bone Density status: Ordered and scheduled for 06/15/23. Pt provided with contact info and advised to call to schedule appt.  Lung Cancer Screening: (Low Dose CT Chest recommended if Age 100-80 years, 30 pack-year currently smoking OR have quit w/in 15years.) does not qualify.   Lung Cancer Screening Referral: n/a  Additional Screening:  Hepatitis C Screening: {DOES NOT does:27190::"does not"} qualify; Completed ***  Vision Screening: Recommended annual ophthalmology exams for early detection of glaucoma and other disorders of  the eye. Is the patient up to date with their annual eye exam?  {YES/NO:21197} Who is the provider or what is the name of the office in which the patient attends annual eye exams? *** If pt is not established with a provider, would they like to be referred  to a provider to establish care? {YES/NO:21197}.   Dental Screening: Recommended annual dental exams for proper oral hygiene  Community Resource Referral / Chronic Care Management: CRR required this visit?  {YES/NO:21197}  CCM required this visit?  {YES/NO:21197}     Plan:     I have personally reviewed and noted the following in the patient's chart:   Medical and social history Use of alcohol, tobacco or illicit drugs  Current medications and supplements including opioid prescriptions. {Opioid Prescriptions:[phone]} Functional ability and status Nutritional status Physical activity Advanced directives List of other physicians Hospitalizations, surgeries, and ER visits in previous 12 months Vitals Screenings to include cognitive, depression, and falls Referrals and appointments  In addition, I have reviewed and discussed with patient certain preventive protocols, quality metrics, and best practice recommendations. A written personalized care plan for preventive services as well as general preventive health recommendations were provided to patient.     Eimy, [place], [place]   11/11/1094   Due to this being a virtual visit, the after visit summary with patients personalized plan was offered to patient via mail or my-chart. ***Patient declined at this time./ Patient would like to access on my-chart/ per request, patient was mailed a copy of AVS./ Patient preferred to pick up at office at next visit  Nurse Notes: ***

## 2023-03-20 ENCOUNTER — Encounter: Payer: Self-pay | Admitting: Family Medicine

## 2023-03-20 ENCOUNTER — Ambulatory Visit (INDEPENDENT_AMBULATORY_CARE_PROVIDER_SITE_OTHER): Payer: 59 | Admitting: Family Medicine

## 2023-03-20 VITALS — BP 115/68 | HR 66 | Temp 98.2°F | Ht 65.0 in | Wt 163.0 lb

## 2023-03-20 DIAGNOSIS — J069 Acute upper respiratory infection, unspecified: Secondary | ICD-10-CM

## 2023-03-20 MED ORDER — BENZONATATE 200 MG PO CAPS: 200.0000 mg | ORAL_CAPSULE | Freq: Two times a day (BID) | ORAL | 0 refills | Status: DC | PRN

## 2023-03-20 NOTE — Progress Notes (Signed)
Subjective:  HPI: Christine Hogan is a 77 y.o. female presenting on 03/20/2023 for Follow-up (bad cough/chest hurts when coughing//)   HPI Patient complains of a dry cough that is waking her at night, worse at night for 1 week and causes pain with coughing for 2 days. She is also sneezing a lot and wheezing some. Denies fever, congestion, runny nose, shortness of breath. No known sick exposures.  Has tried Coricidin with some relief.   ROS: Negative unless specifically indicated above in HPI.   Relevant past medical history reviewed and updated as indicated.   Past Medical History:  Diagnosis Date   Arthritis    Atrial fibrillation (HCC)    Chest pain    a. 2014: NST with no evidence of ischemia   CKD (chronic kidney disease) stage 3, GFR 30-59 ml/min (HCC)    Diastolic dysfunction    grade 2 with dyspnea on exertion   H/O echocardiogram    a. 2014: echo showing EF of 65-70% with Grade 1 DD, moderate TR, and mild to moderate MR   Hyperlipidemia    Hypertension    Near syncope    Osteopenia      Past Surgical History:  Procedure Laterality Date   ABDOMINAL HYSTERECTOMY     TAH/BSO (menorrhagia)   bunion repair     HERNIA REPAIR      Allergies and medications reviewed and updated.   Current Outpatient Medications:    amLODipine (NORVASC) 10 MG tablet, TAKE 1 TABLET BY MOUTH DAILY, Disp: 70 tablet, Rfl: 4   benzonatate (TESSALON) 200 MG capsule, Take 1 capsule (200 mg total) by mouth 2 (two) times daily as needed for cough., Disp: 20 capsule, Rfl: 0   diclofenac Sodium (VOLTAREN) 1 % GEL, Apply 2 g topically 4 (four) times daily., Disp: 50 g, Rfl: 0   ELIQUIS 5 MG TABS tablet, TAKE 1 TABLET BY MOUTH TWICE  DAILY, Disp: 200 tablet, Rfl: 2   lidocaine (XYLOCAINE) 5 % ointment, Apply 1 Application topically as needed., Disp: 35.44 g, Rfl: 0   losartan (COZAAR) 100 MG tablet, Take 1 tablet (100 mg total) by mouth daily., Disp: 90 tablet, Rfl: 3   metoprolol tartrate  (LOPRESSOR) 50 MG tablet, Take 50 mg by mouth 2 (two) times daily., Disp: , Rfl:    Multiple Vitamins-Minerals (CENTRUM SILVER 50+WOMEN) TABS, Take 1 tablet by mouth daily with breakfast., Disp: , Rfl:    neomycin-polymyxin-hydrocortisone (CORTISPORIN) OTIC solution, Place 3 drops into the right ear 4 (four) times daily., Disp: 10 mL, Rfl: 0   predniSONE (DELTASONE) 20 MG tablet, 3 tabs poqday 1-2, 2 tabs poqday 3-4, 1 tab poqday 5-6, Disp: 12 tablet, Rfl: 0   rosuvastatin (CRESTOR) 10 MG tablet, TAKE 1 TABLET BY MOUTH ONCE  DAILY, Disp: 100 tablet, Rfl: 2  Allergies  Allergen Reactions   Codeine Nausea And Vomiting   Codeine Nausea And Vomiting and Other (See Comments)    Dizziness, also    Objective:   BP 115/68   Pulse 66   Temp 98.2 F (36.8 C) (Oral)   Ht 5\' 5"  (1.651 m)   Wt 163 lb (73.9 kg)   SpO2 98%   BMI 27.12 kg/m    Physical Exam Vitals and nursing note reviewed.  Constitutional:      Appearance: Normal appearance. She is normal weight.  HENT:     Head: Normocephalic and atraumatic.  Cardiovascular:     Rate and Rhythm: Normal rate. Rhythm irregular.  Pulses: Normal pulses.     Heart sounds: Normal heart sounds.  Pulmonary:     Effort: Pulmonary effort is normal.     Breath sounds: Normal breath sounds.  Skin:    General: Skin is warm and dry.  Neurological:     General: No focal deficit present.     Mental Status: She is alert and oriented to person, place, and time. Mental status is at baseline.  Psychiatric:        Mood and Affect: Mood normal.        Behavior: Behavior normal.        Thought Content: Thought content normal.        Judgment: Judgment normal.     Assessment & Plan:  Viral URI with cough Assessment & Plan: Reassured patient that symptoms and exam findings are most consistent with a viral upper respiratory infection and explained lack of efficacy of antibiotics against viruses.  Discussed expected course and features suggestive of  secondary bacterial infection.  Continue supportive care. Increase fluid intake with water or electrolyte solution like pedialyte. Encouraged acetaminophen as needed for fever/pain. Encouraged salt water gargling, chloraseptic spray and throat lozenges. Encouraged OTC guaifenesin. Encouraged saline sinus flushes and/or neti with humidified air.  Return to office if symptoms worsen or persist.   Other orders -     Benzonatate; Take 1 capsule (200 mg total) by mouth 2 (two) times daily as needed for cough.  Dispense: 20 capsule; Refill: 0     Follow up plan: Return if symptoms worsen or fail to improve.  Park Meo, FNP

## 2023-03-20 NOTE — Assessment & Plan Note (Signed)
Reassured patient that symptoms and exam findings are most consistent with a viral upper respiratory infection and explained lack of efficacy of antibiotics against viruses.  Discussed expected course and features suggestive of secondary bacterial infection.  Continue supportive care. Increase fluid intake with water or electrolyte solution like pedialyte. Encouraged acetaminophen as needed for fever/pain. Encouraged salt water gargling, chloraseptic spray and throat lozenges. Encouraged OTC guaifenesin. Encouraged saline sinus flushes and/or neti with humidified air.  Return to office if symptoms worsen or persist.

## 2023-03-20 NOTE — Patient Instructions (Signed)

## 2023-03-22 ENCOUNTER — Ambulatory Visit (INDEPENDENT_AMBULATORY_CARE_PROVIDER_SITE_OTHER): Payer: 59

## 2023-03-22 VITALS — Ht 65.0 in | Wt 163.0 lb

## 2023-03-22 DIAGNOSIS — Z0000 Encounter for general adult medical examination without abnormal findings: Secondary | ICD-10-CM | POA: Diagnosis not present

## 2023-03-22 NOTE — Progress Notes (Signed)
Subjective:   Christine Hogan is a 77 y.o. female who presents for Medicare Annual (Subsequent) preventive examination.  Review of Systems     Cardiac Risk Factors include: advanced age (>56men, >57 women);dyslipidemia;hypertension     Objective:    Today's Vitals   03/22/23 0952  Weight: 163 lb (73.9 kg)  Height: 5\' 5"  (1.651 m)   Body mass index is 27.12 kg/m.     03/22/2023    9:55 AM 03/09/2022    9:18 AM 07/01/2021    2:29 PM 03/04/2021    3:50 PM 09/12/2020   12:57 PM 05/13/2020    2:09 PM 01/02/2016   10:53 AM  Advanced Directives  Does Patient Have a Medical Advance Directive? No No No No No No No  Would patient like information on creating a medical advance directive? Yes (MAU/Ambulatory/Procedural Areas - Information given) No - Patient declined Yes (MAU/Ambulatory/Procedural Areas - Information given) No - Patient declined   No - patient declined information    Current Medications (verified) Outpatient Encounter Medications as of 03/22/2023  Medication Sig   amLODipine (NORVASC) 10 MG tablet TAKE 1 TABLET BY MOUTH DAILY   benzonatate (TESSALON) 200 MG capsule Take 1 capsule (200 mg total) by mouth 2 (two) times daily as needed for cough.   diclofenac Sodium (VOLTAREN) 1 % GEL Apply 2 g topically 4 (four) times daily.   ELIQUIS 5 MG TABS tablet TAKE 1 TABLET BY MOUTH TWICE  DAILY   lidocaine (XYLOCAINE) 5 % ointment Apply 1 Application topically as needed.   losartan (COZAAR) 100 MG tablet Take 1 tablet (100 mg total) by mouth daily.   metoprolol tartrate (LOPRESSOR) 50 MG tablet Take 50 mg by mouth 2 (two) times daily.   Multiple Vitamins-Minerals (CENTRUM SILVER 50+WOMEN) TABS Take 1 tablet by mouth daily with breakfast.   neomycin-polymyxin-hydrocortisone (CORTISPORIN) OTIC solution Place 3 drops into the right ear 4 (four) times daily.   predniSONE (DELTASONE) 20 MG tablet 3 tabs poqday 1-2, 2 tabs poqday 3-4, 1 tab poqday 5-6   rosuvastatin (CRESTOR) 10 MG tablet  TAKE 1 TABLET BY MOUTH ONCE  DAILY   No facility-administered encounter medications on file as of 03/22/2023.    Allergies (verified) Codeine and Codeine   History: Past Medical History:  Diagnosis Date   Arthritis    Atrial fibrillation (HCC)    Chest pain    a. 2014: NST with no evidence of ischemia   CKD (chronic kidney disease) stage 3, GFR 30-59 ml/min (HCC)    Diastolic dysfunction    grade 2 with dyspnea on exertion   H/O echocardiogram    a. 2014: echo showing EF of 65-70% with Grade 1 DD, moderate TR, and mild to moderate MR   Hyperlipidemia    Hypertension    Near syncope    Osteopenia    Past Surgical History:  Procedure Laterality Date   ABDOMINAL HYSTERECTOMY     TAH/BSO (menorrhagia)   bunion repair     HERNIA REPAIR     Family History  Problem Relation Age of Onset   Kidney failure Mother    Hypertension Father    Heart disease Father        pacemaker- followedby Dr. Royann Shivers   Diabetes Sister    Hypertension Sister    Diabetes Sister    Hypertension Sister    Hypertension Daughter    Hypertension Daughter    Breast cancer Daughter    Social History   Socioeconomic History  Marital status: Divorced    Spouse name: Not on file   Number of children: 5   Years of education: 12th grade   Highest education level: Not on file  Occupational History   Occupation: Retired  Tobacco Use   Smoking status: Never   Smokeless tobacco: Never  Vaping Use   Vaping Use: Never used  Substance and Sexual Activity   Alcohol use: Yes    Comment: occasionally   Drug use: No   Sexual activity: Yes  Other Topics Concern   Not on file  Social History Narrative   ** Merged History Encounter **       Lives with her son. Right-handed. Four living children. Occasional caffeine use.   Social Determinants of Health   Financial Resource Strain: Low Risk  (03/22/2023)   Overall Financial Resource Strain (CARDIA)    Difficulty of Paying Living Expenses: Not  hard at all  Food Insecurity: No Food Insecurity (03/22/2023)   Hunger Vital Sign    Worried About Running Out of Food in the Last Year: Never true    Ran Out of Food in the Last Year: Never true  Transportation Needs: No Transportation Needs (03/22/2023)   PRAPARE - Administrator, Civil Service (Medical): No    Lack of Transportation (Non-Medical): No  Physical Activity: Insufficiently Active (03/22/2023)   Exercise Vital Sign    Days of Exercise per Week: 3 days    Minutes of Exercise per Session: 30 min  Stress: No Stress Concern Present (03/22/2023)   [employer] of Occupational Health - Occupational Stress Questionnaire    Feeling of Stress : Not at all  Social Connections: Moderately Integrated (03/22/2023)   Social Connection and Isolation Panel [NHANES]    Frequency of Communication with Friends and Family: More than three times a week    Frequency of Social Gatherings with Friends and Family: Three times a week    Attends Religious Services: More than 4 times per year    Active Member of Clubs or Organizations: Yes    Attends Engineer, structural: More than 4 times per year    Marital Status: Divorced    Tobacco Counseling Counseling given: Not Answered   Clinical Intake:  Pre-visit preparation completed: Yes  Pain : No/denies pain  Diabetes: No  How often do you need to have someone help you when you read instructions, pamphlets, or other written materials from your doctor or pharmacy?: 1 - Never  Diabetic?No   Interpreter Needed?: No  Information entered by :: Kandis Fantasia LPN   Activities of Daily Living    03/22/2023    9:54 AM  In your present state of health, do you have any difficulty performing the following activities:  Hearing? 0  Vision? 0  Difficulty concentrating or making decisions? 0  Walking or climbing stairs? 0  Dressing or bathing? 0  Doing errands, shopping? 0  Preparing Food and eating ? N  Using the  Toilet? N  In the past six months, have you accidently leaked urine? N  Do you have problems with loss of bowel control? N  Managing your Medications? N  Managing your Finances? N  Housekeeping or managing your Housekeeping? N    Patient Care Team: Donita Brooks, MD as PCP - General (Family Medicine) Thurmon Fair, MD as PCP - Cardiology (Cardiology) Donita Brooks, MD (Family Medicine) Conley Rolls, [place], [place] as Referring Physician (Optometry)  Indicate any recent Medical Services you may  have received from other than [facility] providers in the past year (date may be approximate).     Assessment:   This is a routine wellness examination for Zoriyah.  Hearing/Vision screen Hearing Screening - Comments:: Denies hearing difficulties   Vision Screening - Comments:: Wears rx glasses - up to date with routine [facility] exams with [facility])    Dietary issues and exercise activities discussed: Current Exercise Habits: Home exercise routine, Type of exercise: walking;stretching, Time (Minutes): 30, Frequency (Times/Week): 3, Weekly Exercise (Minutes/Week): 90, Intensity: Mild   Goals Addressed             This Visit's Progress    COMPLETED: Exercise 3x per week (30 min per time)       03/09/2021-Encouraged chair exercises.      COMPLETED: Reduce alcohol intake       Remain active and independent        Depression Screen    03/22/2023    9:55 AM 03/20/2023    2:09 PM 12/25/2022   10:25 AM 11/21/2022    9:14 AM 03/09/2022    9:14 AM 07/01/2021    2:12 PM 03/04/2021    3:54 PM  PHQ 2/9 Scores  PHQ - 2 Score 0 0 0 1 0 0 0  PHQ- 9 Score 0 5         Fall Risk    03/22/2023    9:54 AM 12/25/2022   10:25 AM 11/21/2022    9:14 AM 03/09/2022    9:21 AM 07/01/2021    2:11 PM  Fall Risk   Falls in the past year? 0 0 0 0 1  Number falls in past yr: 0 0 0 0 0  Injury with Fall? 0 0 0 0 1  Risk for fall due to : No Fall Risks No Fall Risks No Fall Risks Impaired balance/gait    Follow up Falls prevention discussed;Education provided;Falls evaluation completed Falls prevention discussed Falls prevention discussed Falls prevention discussed     FALL RISK PREVENTION PERTAINING TO THE HOME:  Any stairs in or around the home? No  If so, are there any without handrails? No  Home free of loose throw rugs in walkways, pet beds, electrical cords, etc? Yes  Adequate lighting in your home to reduce risk of falls? Yes   ASSISTIVE DEVICES UTILIZED TO PREVENT FALLS:  Life alert? No  Use of a cane, walker or w/c? No  Grab bars in the bathroom? Yes  Shower chair or bench in shower? No  Elevated toilet seat or a handicapped toilet? Yes   TIMED UP AND GO:  Was the test performed? No . Telephonic visit   Cognitive Function:        03/22/2023    9:55 AM 03/09/2022    9:23 AM  6CIT Screen  What Year? 0 points 0 points  What month? 0 points 0 points  What time? 0 points 0 points  Count back from 20 0 points 0 points  Months in reverse 0 points 0 points  Repeat phrase 0 points 2 points  Total Score 0 points 2 points    Immunizations Immunization History  Administered Date(s) Administered   Influenza,inj,Quad PF,6+ Mos 12/24/2017, 09/22/2018   PFIZER(Purple Top)SARS-COV-2 Vaccination 12/13/2019, 01/03/2020, 09/02/2020   Pneumococcal Conjugate-13 05/12/2014   Pneumococcal Polysaccharide-23 12/24/2017   Tdap 01/02/2016    TDAP status: Up to date  Pneumococcal vaccine status: Up to date  Covid-19 vaccine status: Information provided on how to obtain  vaccines.   Qualifies for Shingles Vaccine? Yes   Zostavax completed No   Shingrix Completed?: No.    Education has been provided regarding the importance of this vaccine. Patient has been advised to call insurance company to determine out of pocket expense if they have not yet received this vaccine. Advised may also receive vaccine at local pharmacy or Health Dept. Verbalized acceptance and  understanding.  Screening Tests Health Maintenance  Topic Date Due   Zoster Vaccines- Shingrix (1 of 2) Never done   COLONOSCOPY (Pts 45-80yrs Insurance coverage will need to be confirmed)  Never done   COVID-19 Vaccine (4 - 2023-24 season) 07/07/2022   INFLUENZA VACCINE  06/07/2023   Medicare Annual Wellness (AWV)  03/21/2024   DTaP/Tdap/Td (2 - Td or Tdap) 01/01/2026   Pneumonia Vaccine 80+ Years old  Completed   DEXA SCAN  Completed   HPV VACCINES  Aged Out   Hepatitis C Screening  Discontinued    Health Maintenance  Health Maintenance Due  Topic Date Due   Zoster Vaccines- Shingrix (1 of 2) Never done   COLONOSCOPY (Pts 45-66yrs Insurance coverage will need to be confirmed)  Never done   COVID-19 Vaccine (4 - 2023-24 season) 07/07/2022    Colorectal cancer screening: Referral to GI placed (Cologuard ordered 12/25/22). Pt aware the office will call re: appt. (Pt. Plans to complete soon)   Mammogram status: Ordered and scheduled for 06/15/23. Pt provided with contact info and advised to call to schedule appt.   Bone Density status: Ordered and scheduled for 06/15/23. Pt provided with contact info and advised to call to schedule appt.  Lung Cancer Screening: (Low Dose CT Chest recommended if Age 83-80 years, 30 pack-year currently smoking OR have quit w/in 15years.) does not qualify.   Lung Cancer Screening Referral: n/a  Additional Screening:  Hepatitis C Screening: does qualify; Patient declines   Vision Screening: Recommended annual ophthalmology exams for early detection of glaucoma and other disorders of the eye. Is the patient up to date with their annual eye exam?  Yes  Who is the provider or what is the name of the office in which the patient attends annual eye exams? [facility]  If pt is not established with a provider, would they like to be referred to a provider to establish care? No .   Dental Screening: Recommended annual dental exams for proper oral  hygiene  Community Resource Referral / Chronic Care Management: CRR required this visit?  No   CCM required this visit?  No      Plan:     I have personally reviewed and noted the following in the patient's chart:   Medical and social history Use of alcohol, tobacco or illicit drugs  Current medications and supplements including opioid prescriptions. Patient is not currently taking opioid prescriptions. Functional ability and status Nutritional status Physical activity Advanced directives List of other physicians Hospitalizations, surgeries, and ER visits in previous 12 months Vitals Screenings to include cognitive, depression, and falls Referrals and appointments  In addition, I have reviewed and discussed with patient certain preventive protocols, quality metrics, and best practice recommendations. A written personalized care plan for preventive services as well as general preventive health recommendations were provided to patient.     Fennie, [place], [place]   02/12/8118   Due to this being a virtual visit, the after visit summary with patients personalized plan was offered to patient via mail or my-chart.  per request, patient was  mailed a copy of AVS.  Nurse Notes: No concerns

## 2023-03-22 NOTE — Patient Instructions (Signed)
Christine Hogan , Thank you for taking time to come for your Medicare Wellness Visit. I appreciate your ongoing commitment to your health goals. Please review the following plan we discussed and let me know if I can assist you in the future.   These are the goals we discussed:  Goals      Remain active and independent        This is a list of the screening recommended for you and due dates:  Health Maintenance  Topic Date Due   Zoster (Shingles) Vaccine (1 of 2) Never done   Colon Cancer Screening  Never done   COVID-19 Vaccine (4 - 2023-24 season) 07/07/2022   Flu Shot  06/07/2023   Medicare Annual Wellness Visit  03/21/2024   DTaP/Tdap/Td vaccine (2 - Td or Tdap) 01/01/2026   Pneumonia Vaccine  Completed   DEXA scan (bone density measurement)  Completed   HPV Vaccine  Aged Out   Hepatitis C Screening: USPSTF Recommendation to screen - Ages 28-79 yo.  Discontinued    Advanced directives: Information on Advanced Care Planning can be found at [facility] of [place] Advance Health Care Directives Advance Health Care Directives ([email or web address]   Conditions/risks identified: Aim for 30 minutes of exercise or brisk walking, 6-8 glasses of water, and 5 servings of fruits and vegetables each day.  Next appointment: Follow up in one year for your annual wellness visit    Preventive Care 65 Years and Older, Female Preventive care refers to lifestyle choices and visits with your health care provider that can promote health and wellness. What does preventive care include? A yearly physical exam. This is also called an annual well check. Dental exams once or twice a year. Routine eye exams. Ask your health care provider how often you should have your eyes checked. Personal lifestyle choices, including: Daily care of your teeth and gums. Regular physical activity. Eating a healthy diet. Avoiding tobacco and drug use. Limiting alcohol use. Practicing safe sex. Taking low-dose aspirin  every day. Taking vitamin and mineral supplements as recommended by your health care provider. What happens during an annual well check? The services and screenings done by your health care provider during your annual well check will depend on your age, overall health, lifestyle risk factors, and family history of disease. Counseling  Your health care provider may ask you questions about your: Alcohol use. Tobacco use. Drug use. Emotional well-being. Home and relationship well-being. Sexual activity. Eating habits. History of falls. Memory and ability to understand (cognition). Work and work Astronomer. Reproductive health. Screening  You may have the following tests or measurements: Height, weight, and BMI. Blood pressure. Lipid and cholesterol levels. These may be checked every 5 years, or more frequently if you are over 72 years old. Skin check. Lung cancer screening. You may have this screening every year starting at age 56 if you have a 30-pack-year history of smoking and currently smoke or have quit within the past 15 years. Fecal occult blood test (FOBT) of the stool. You may have this test every year starting at age 33. Flexible sigmoidoscopy or colonoscopy. You may have a sigmoidoscopy every 5 years or a colonoscopy every 10 years starting at age 84. Hepatitis C blood test. Hepatitis B blood test. Sexually transmitted disease (STD) testing. Diabetes screening. This is done by checking your blood sugar (glucose) after you have not eaten for a while (fasting). You may have this done every 1-3 years. Bone density scan. This is done  to screen for osteoporosis. You may have this done starting at age 38. Mammogram. This may be done every 1-2 years. Talk to your health care provider about how often you should have regular mammograms. Talk with your health care provider about your test results, treatment options, and if necessary, the need for more tests. Vaccines  Your health  care provider may recommend certain vaccines, such as: Influenza vaccine. This is recommended every year. Tetanus, diphtheria, and acellular pertussis (Tdap, Td) vaccine. You may need a Td booster every 10 years. Zoster vaccine. You may need this after age 49. Pneumococcal 13-valent conjugate (PCV13) vaccine. One dose is recommended after age 62. Pneumococcal polysaccharide (PPSV23) vaccine. One dose is recommended after age 66. Talk to your health care provider about which screenings and vaccines you need and how often you need them. This information is not intended to replace advice given to you by your health care provider. Make sure you discuss any questions you have with your health care provider. Document Released: 11/19/2015 Document Revised: 07/12/2016 Document Reviewed: 08/24/2015 Elsevier Interactive Patient Education  2017 [employer].  Fall Prevention in the Home Falls can cause injuries. They can happen to people of all ages. There are many things you can do to make your home safe and to help prevent falls. What can I do on the outside of my home? Regularly fix the edges of walkways and driveways and fix any cracks. Remove anything that might make you trip as you walk through a door, such as a raised step or threshold. Trim any bushes or trees on the path to your home. Use bright outdoor lighting. Clear any walking paths of anything that might make someone trip, such as rocks or tools. Regularly check to see if handrails are loose or broken. Make sure that both sides of any steps have handrails. Any raised decks and porches should have guardrails on the edges. Have any leaves, snow, or ice cleared regularly. Use sand or salt on walking paths during winter. Clean up any spills in your garage right away. This includes oil or grease spills. What can I do in the bathroom? Use night lights. Install grab bars by the toilet and in the tub and shower. Do not use towel bars as grab  bars. Use non-skid mats or decals in the tub or shower. If you need to sit down in the shower, use a plastic, non-slip stool. Keep the floor dry. Clean up any water that spills on the floor as soon as it happens. Remove soap buildup in the tub or shower regularly. Attach bath mats securely with double-sided non-slip rug tape. Do not have throw rugs and other things on the floor that can make you trip. What can I do in the bedroom? Use night lights. Make sure that you have a light by your bed that is easy to reach. Do not use any sheets or blankets that are too big for your bed. They should not hang down onto the floor. Have a firm chair that has side arms. You can use this for support while you get dressed. Do not have throw rugs and other things on the floor that can make you trip. What can I do in the kitchen? Clean up any spills right away. Avoid walking on wet floors. Keep items that you use a lot in easy-to-reach places. If you need to reach something above you, use a strong step stool that has a grab bar. Keep electrical cords out of the  way. Do not use floor polish or wax that makes floors slippery. If you must use wax, use non-skid floor wax. Do not have throw rugs and other things on the floor that can make you trip. What can I do with my stairs? Do not leave any items on the stairs. Make sure that there are handrails on both sides of the stairs and use them. Fix handrails that are broken or loose. Make sure that handrails are as long as the stairways. Check any carpeting to make sure that it is firmly attached to the stairs. Fix any carpet that is loose or worn. Avoid having throw rugs at the top or bottom of the stairs. If you do have throw rugs, attach them to the floor with carpet tape. Make sure that you have a light switch at the top of the stairs and the bottom of the stairs. If you do not have them, ask someone to add them for you. What else can I do to help prevent  falls? Wear shoes that: Do not have high heels. Have rubber bottoms. Are comfortable and fit you well. Are closed at the toe. Do not wear sandals. If you use a stepladder: Make sure that it is fully opened. Do not climb a closed stepladder. Make sure that both sides of the stepladder are locked into place. Ask someone to hold it for you, if possible. Clearly mark and make sure that you can see: Any grab bars or handrails. First and last steps. Where the edge of each step is. Use tools that help you move around (mobility aids) if they are needed. These include: Canes. Walkers. Scooters. Crutches. Turn on the lights when you go into a dark area. Replace any light bulbs as soon as they burn out. Set up your furniture so you have a clear path. Avoid moving your furniture around. If any of your floors are uneven, fix them. If there are any pets around you, be aware of where they are. Review your medicines with your doctor. Some medicines can make you feel dizzy. This can increase your chance of falling. Ask your doctor what other things that you can do to help prevent falls. This information is not intended to replace advice given to you by your health care provider. Make sure you discuss any questions you have with your health care provider. Document Released: 08/19/2009 Document Revised: 03/30/2016 Document Reviewed: 11/27/2014 Elsevier Interactive Patient Education  2017 [employer].

## 2023-03-23 ENCOUNTER — Telehealth: Payer: Self-pay

## 2023-03-23 NOTE — Telephone Encounter (Signed)
Pt called in wanting to speak with nurse about feeling a lump when she swallows. Pt states that it is uncomfortable when she swallows. Pt would like to speak to nurse to know if she should be seen in office for this? Please advise.   Cb#: [phone]

## 2023-03-26 ENCOUNTER — Ambulatory Visit (INDEPENDENT_AMBULATORY_CARE_PROVIDER_SITE_OTHER): Payer: 59 | Admitting: Family Medicine

## 2023-03-26 VITALS — BP 118/64 | HR 45 | Temp 97.9°F | Ht 65.0 in | Wt 158.6 lb

## 2023-03-26 DIAGNOSIS — I10 Essential (primary) hypertension: Secondary | ICD-10-CM

## 2023-03-26 DIAGNOSIS — R09A2 Foreign body sensation, throat: Secondary | ICD-10-CM

## 2023-03-26 LAB — BASIC METABOLIC PANEL WITH GFR
BUN/Creatinine Ratio: 11 (calc) (ref 6–22)
BUN: 20 mg/dL (ref 7–25)
CO2: 27 mmol/L (ref 20–32)
Calcium: 9.8 mg/dL (ref 8.6–10.4)
Chloride: 104 mmol/L (ref 98–110)
Creat: 1.84 mg/dL — ABNORMAL HIGH (ref 0.60–1.00)
Glucose, Bld: 151 mg/dL — ABNORMAL HIGH (ref 65–99)
Potassium: 3.9 mmol/L (ref 3.5–5.3)
Sodium: 142 mmol/L (ref 135–146)
eGFR: 28 mL/min/{1.73_m2} — ABNORMAL LOW (ref >=60)

## 2023-03-26 NOTE — Progress Notes (Signed)
Subjective:    Patient ID: Christine Hogan, female    DOB: 1946-09-13, 76 y.o.   MRN: [id number]   Patient was recently seen by my partner and was given Jerilynn Som for an upper respiratory infection.  She states that for the last week, she feels like there is a lump in her throat roughly around the level of the cricoid cartilage.  She does report some mild trouble swallowing.  She feels like there is phlegm or something stuck in her throat.  She denies any pain with swallowing.  She denies any hemoptysis.  She denies any fever or chills.  She denies any hematemesis.  She denies any shortness of breath or pleurisy or stridor.  Her exam today is benign.  She is not hoarse. Past Medical History:  Diagnosis Date   Arthritis    Atrial fibrillation (HCC)    Chest pain    a. 2014: NST with no evidence of ischemia   CKD (chronic kidney disease) stage 3, GFR 30-59 ml/min (HCC)    Diastolic dysfunction    grade 2 with dyspnea on exertion   H/O echocardiogram    a. 2014: echo showing EF of 65-70% with Grade 1 DD, moderate TR, and mild to moderate MR   Hyperlipidemia    Hypertension    Near syncope    Osteopenia    Past Surgical History:  Procedure Laterality Date   ABDOMINAL HYSTERECTOMY     TAH/BSO (menorrhagia)   bunion repair     HERNIA REPAIR     Current Outpatient Medications on File Prior to Visit  Medication Sig Dispense Refill   amLODipine (NORVASC) 10 MG tablet TAKE 1 TABLET BY MOUTH DAILY 70 tablet 4   benzonatate (TESSALON) 200 MG capsule Take 1 capsule (200 mg total) by mouth 2 (two) times daily as needed for cough. 20 capsule 0   diclofenac Sodium (VOLTAREN) 1 % GEL Apply 2 g topically 4 (four) times daily. 50 g 0   ELIQUIS 5 MG TABS tablet TAKE 1 TABLET BY MOUTH TWICE  DAILY 200 tablet 2   lidocaine (XYLOCAINE) 5 % ointment Apply 1 Application topically as needed. 35.44 g 0   losartan (COZAAR) 100 MG tablet Take 1 tablet (100 mg total) by mouth daily. 90 tablet 3    metoprolol tartrate (LOPRESSOR) 50 MG tablet Take 50 mg by mouth 2 (two) times daily.     Multiple Vitamins-Minerals (CENTRUM SILVER 50+WOMEN) TABS Take 1 tablet by mouth daily with breakfast.     neomycin-polymyxin-hydrocortisone (CORTISPORIN) OTIC solution Place 3 drops into the right ear 4 (four) times daily. 10 mL 0   rosuvastatin (CRESTOR) 10 MG tablet TAKE 1 TABLET BY MOUTH ONCE  DAILY 100 tablet 2   predniSONE (DELTASONE) 20 MG tablet 3 tabs poqday 1-2, 2 tabs poqday 3-4, 1 tab poqday 5-6 (Patient not taking: Reported on 03/26/2023) 12 tablet 0   No current facility-administered medications on file prior to visit.   Allergies  Allergen Reactions   Codeine Nausea And Vomiting   Codeine Nausea And Vomiting and Other (See Comments)    Dizziness, also   Social History   Socioeconomic History   Marital status: Divorced    Spouse name: Not on file   Number of children: 5   Years of education: 12th grade   Highest education level: Not on file  Occupational History   Occupation: Retired  Tobacco Use   Smoking status: Never   Smokeless tobacco: Never  Advertising account planner  Vaping Use: Never used  Substance and Sexual Activity   Alcohol use: Yes    Comment: occasionally   Drug use: No   Sexual activity: Yes  Other Topics Concern   Not on file  Social History Narrative   ** Merged History Encounter **       Lives with her son. Right-handed. Four living children. Occasional caffeine use.   Social Determinants of Health   Financial Resource Strain: Low Risk  (03/22/2023)   Overall Financial Resource Strain (CARDIA)    Difficulty of Paying Living Expenses: Not hard at all  Food Insecurity: No Food Insecurity (03/22/2023)   Hunger Vital Sign    Worried About Running Out of Food in the Last Year: Never true    Ran Out of Food in the Last Year: Never true  Transportation Needs: No Transportation Needs (03/22/2023)   PRAPARE - Administrator, Civil Service (Medical): No     Lack of Transportation (Non-Medical): No  Physical Activity: Insufficiently Active (03/22/2023)   Exercise Vital Sign    Days of Exercise per Week: 3 days    Minutes of Exercise per Session: 30 min  Stress: No Stress Concern Present (03/22/2023)   [employer] of Occupational Health - Occupational Stress Questionnaire    Feeling of Stress : Not at all  Social Connections: Moderately Integrated (03/22/2023)   Social Connection and Isolation Panel [NHANES]    Frequency of Communication with Friends and Family: More than three times a week    Frequency of Social Gatherings with Friends and Family: Three times a week    Attends Religious Services: More than 4 times per year    Active Member of Clubs or Organizations: Yes    Attends Banker Meetings: More than 4 times per year    Marital Status: Divorced  Intimate Partner Violence: Not At Risk (03/22/2023)   Humiliation, Afraid, Rape, and Kick questionnaire    Fear of Current or Ex-Partner: No    Emotionally Abused: No    Physically Abused: No    Sexually Abused: No     Review of Systems  Gastrointestinal:  Positive for abdominal pain.  All other systems reviewed and are negative.      Objective:   Physical Exam Vitals reviewed.  Constitutional:      General: She is not in acute distress.    Appearance: Normal appearance. She is normal weight. She is not ill-appearing, toxic-appearing or diaphoretic.  HENT:     Right Ear: Tympanic membrane and ear canal normal.     Left Ear: Tympanic membrane and ear canal normal.     Nose: No congestion or rhinorrhea.     Mouth/Throat:     Mouth: Mucous membranes are moist. No oral lesions.     Pharynx: Oropharynx is clear. Uvula midline. No pharyngeal swelling, oropharyngeal exudate, posterior oropharyngeal erythema or uvula swelling.     Tonsils: No tonsillar exudate.  Eyes:     Extraocular Movements: Extraocular movements intact.     Right eye: Normal extraocular motion.      Left eye: Normal extraocular motion.     Pupils: Pupils are equal, round, and reactive to light.  Neck:     Vascular: No carotid bruit.  Cardiovascular:     Rate and Rhythm: Normal rate and regular rhythm.     Pulses: Normal pulses.     Heart sounds: Normal heart sounds. No murmur heard.    No friction rub. No gallop.  Pulmonary:  Effort: Pulmonary effort is normal. No respiratory distress.     Breath sounds: Normal breath sounds. No stridor. No wheezing, rhonchi or rales.  Abdominal:     General: Abdomen is flat. Bowel sounds are decreased. There is no distension.     Palpations: Abdomen is soft.     Tenderness: There is no abdominal tenderness. There is no guarding or rebound.     Hernia: No hernia is present.  Musculoskeletal:     Cervical back: Neck supple.  Lymphadenopathy:     Cervical: No cervical adenopathy.  Neurological:     General: No focal deficit present.     Mental Status: She is alert and oriented to person, place, and time. Mental status is at baseline.     Cranial Nerves: No cranial nerve deficit.     Sensory: No sensory deficit.     Motor: No weakness.     Coordination: Coordination normal.     Gait: Gait normal.     Deep Tendon Reflexes: Reflexes normal.  Psychiatric:        Mood and Affect: Mood normal.        Behavior: Behavior normal.        Thought Content: Thought content normal.        Judgment: Judgment normal.           Assessment & Plan:  Essential hypertension - Plan: BASIC METABOLIC PANEL WITH GFR  Globus sensation Recently had stopped her hydrochlorothiazide due to an elevation in her creatinine.  I will recheck that today while the patient is here.  Her creatinine in February was 1.7.  She has been off the hydrochlorothiazide ever since.  I do not see any abnormalities on her exam.  I believe that she may have some swelling in her upper larnyx due to coughing and drainage giving her the perception of a globus sensation.  I  recommended giving tincture of time for 1 week.  If not improving we can try oral prednisone and if not improving after that I would recommend laryngoscopy.

## 2023-03-27 NOTE — Progress Notes (Signed)
I connected with  Arloa Koh on 03/22/23 by a audio enabled telemedicine application and verified that I am speaking with the correct person using two identifiers.  Patient Location: Home  Provider Location: Office/Clinic  I discussed the limitations of evaluation and management by telemedicine. The patient expressed understanding and agreed to proceed.

## 2023-03-28 ENCOUNTER — Other Ambulatory Visit: Payer: Self-pay

## 2023-03-28 DIAGNOSIS — N183 Chronic kidney disease, stage 3 unspecified: Secondary | ICD-10-CM

## 2023-03-31 ENCOUNTER — Ambulatory Visit (HOSPITAL_BASED_OUTPATIENT_CLINIC_OR_DEPARTMENT_OTHER)
Admission: RE | Admit: 2023-03-31 | Discharge: 2023-03-31 | Disposition: A | Discharge status: Home / Self Care | Payer: 59 | Source: Ambulatory Visit | Attending: Family Medicine | Admitting: Family Medicine

## 2023-03-31 DIAGNOSIS — N183 Chronic kidney disease, stage 3 unspecified: Secondary | ICD-10-CM | POA: Diagnosis not present

## 2023-04-03 ENCOUNTER — Other Ambulatory Visit: Payer: Self-pay

## 2023-04-03 DIAGNOSIS — N183 Chronic kidney disease, stage 3 unspecified: Secondary | ICD-10-CM

## 2023-04-13 ENCOUNTER — Other Ambulatory Visit: Payer: Self-pay | Admitting: Family Medicine

## 2023-04-13 ENCOUNTER — Other Ambulatory Visit: Payer: Self-pay | Admitting: Cardiovascular Disease

## 2023-04-13 NOTE — Telephone Encounter (Signed)
Requested Prescriptions  Pending Prescriptions Disp Refills   losartan (COZAAR) 100 MG tablet [Pharmacy Med Name: LOSARTAN POTASSIUM 100 MG TAB] 90 tablet 1    Sig: TAKE 1 TABLET BY MOUTH EVERY DAY     Cardiovascular:  Angiotensin Receptor Blockers Failed - 04/13/2023  2:28 AM      Failed - Cr in normal range and within 180 days    Creat  Date Value Ref Range Status  03/26/2023 1.84 (H) 0.60 - 1.00 mg/dL Final         Failed - Valid encounter within last 6 months    Recent Outpatient Visits           1 year ago Acute swimmer's ear of right side   [facility] Pickard, Priscille Heidelberg, MD   1 year ago Generalized abdominal pain   [facility] Tanya Nones, Priscille Heidelberg, MD   1 year ago Acute pain of right knee   [facility] Donita Brooks, MD   1 year ago Dermatitis   [facility] Valentino Nose, NP   1 year ago Syncope, unspecified syncope type   [facility] Pickard, Priscille Heidelberg, MD       Future Appointments             In 1 month Croitoru, Rachelle Hora, MD [facility] - K in normal range and within 180 days    Potassium  Date Value Ref Range Status  03/26/2023 3.9 3.5 - 5.3 mmol/L Final         Passed - Patient is not pregnant      Passed - Last BP in normal range    BP Readings from Last 1 Encounters:  03/26/23 118/64

## 2023-04-13 NOTE — Telephone Encounter (Signed)
Spoke with patient and scheduled her to see Dr. Salena Saner, She is aware that I only sent in enough medication to get her to her appointment and once she see's Dr. Salena Saner she will be able to get the rest of her medication. She gave a verbal understanding.

## 2023-04-26 ENCOUNTER — Ambulatory Visit (INDEPENDENT_AMBULATORY_CARE_PROVIDER_SITE_OTHER): Payer: 59 | Admitting: Family Medicine

## 2023-04-26 ENCOUNTER — Encounter: Payer: Self-pay | Admitting: Family Medicine

## 2023-04-26 VITALS — BP 132/86 | HR 130 | Temp 97.8°F | Ht 65.0 in | Wt 161.0 lb

## 2023-04-26 DIAGNOSIS — R000 Tachycardia, unspecified: Secondary | ICD-10-CM

## 2023-04-26 DIAGNOSIS — H60331 Swimmer's ear, right ear: Secondary | ICD-10-CM | POA: Diagnosis not present

## 2023-04-26 MED ORDER — NEOMYCIN-POLYMYXIN-HC 3.5-10000-1 OT SOLN: 3.0000 [drp] | Freq: Four times a day (QID) | OTIC | 0 refills | Status: DC

## 2023-04-26 NOTE — Progress Notes (Signed)
Subjective:    Patient ID: Christine Hogan, female    DOB: 04-Apr-1946, 77 y.o.   MRN: [id number]  Patient initially made the appointment for right ear pain.  She reports pain and swelling in her right auditory canal.  On examination the Canal is extremely tender to palpation with the speculum.  There is a pustule on the anterior wall that is less than 1 mm in size.  This is tender to palpation.  She appears to have otitis externa.  However my bigger concern is her heart rate.  Her heart rate is 130 bpm.  She denies any palpitations.  She denies any chest pain or shortness of breath.  She admits that she has not taken her metoprolol in more than 24 hours.  She missed last night's dose and skipped this morning's dose.  She denies any chest pain or heaviness Past Medical History:  Diagnosis Date   Arthritis    Atrial fibrillation (HCC)    Chest pain    a. 2014: NST with no evidence of ischemia   CKD (chronic kidney disease) stage 3, GFR 30-59 ml/min (HCC)    Diastolic dysfunction    grade 2 with dyspnea on exertion   H/O echocardiogram    a. 2014: echo showing EF of 65-70% with Grade 1 DD, moderate TR, and mild to moderate MR   Hyperlipidemia    Hypertension    Near syncope    Osteopenia    Past Surgical History:  Procedure Laterality Date   ABDOMINAL HYSTERECTOMY     TAH/BSO (menorrhagia)   bunion repair     HERNIA REPAIR     Current Outpatient Medications on File Prior to Visit  Medication Sig Dispense Refill   amLODipine (NORVASC) 10 MG tablet TAKE 1 TABLET BY MOUTH DAILY 70 tablet 4   diclofenac Sodium (VOLTAREN) 1 % GEL Apply 2 g topically 4 (four) times daily. 50 g 0   ELIQUIS 5 MG TABS tablet TAKE 1 TABLET BY MOUTH TWICE  DAILY 200 tablet 2   lidocaine (XYLOCAINE) 5 % ointment Apply 1 Application topically as needed. 35.44 g 0   losartan (COZAAR) 100 MG tablet TAKE 1 TABLET BY MOUTH EVERY DAY 90 tablet 1   metoprolol tartrate (LOPRESSOR) 50 MG tablet Take 1 tablet (50 mg total)  by mouth 2 (two) times daily. Please keep your upcoming appointment with Dr. Salena Saner to receive the rest of your medication refills. 70 tablet 0   Multiple Vitamins-Minerals (CENTRUM SILVER 50+WOMEN) TABS Take 1 tablet by mouth daily with breakfast.     rosuvastatin (CRESTOR) 10 MG tablet TAKE 1 TABLET BY MOUTH ONCE  DAILY 100 tablet 2   No current facility-administered medications on file prior to visit.   Allergies  Allergen Reactions   Codeine Nausea And Vomiting   Codeine Nausea And Vomiting and Other (See Comments)    Dizziness, also   Social History   Socioeconomic History   Marital status: Divorced    Spouse name: Not on file   Number of children: 5   Years of education: 12th grade   Highest education level: Not on file  Occupational History   Occupation: Retired  Tobacco Use   Smoking status: Never   Smokeless tobacco: Never  Vaping Use   Vaping Use: Never used  Substance and Sexual Activity   Alcohol use: Yes    Comment: occasionally   Drug use: No   Sexual activity: Yes  Other Topics Concern   Not on  file  Social History Narrative   ** Merged History Encounter **       Lives with her son. Right-handed. Four living children. Occasional caffeine use.   Social Determinants of Health   Financial Resource Strain: Low Risk  (03/22/2023)   Overall Financial Resource Strain (CARDIA)    Difficulty of Paying Living Expenses: Not hard at all  Food Insecurity: No Food Insecurity (03/22/2023)   Hunger Vital Sign    Worried About Running Out of Food in the Last Year: Never true    Ran Out of Food in the Last Year: Never true  Transportation Needs: No Transportation Needs (03/22/2023)   PRAPARE - Administrator, Civil Service (Medical): No    Lack of Transportation (Non-Medical): No  Physical Activity: Insufficiently Active (03/22/2023)   Exercise Vital Sign    Days of Exercise per Week: 3 days    Minutes of Exercise per Session: 30 min  Stress: No Stress  Concern Present (03/22/2023)   [employer] of Occupational Health - Occupational Stress Questionnaire    Feeling of Stress : Not at all  Social Connections: Moderately Integrated (03/22/2023)   Social Connection and Isolation Panel [NHANES]    Frequency of Communication with Friends and Family: More than three times a week    Frequency of Social Gatherings with Friends and Family: Three times a week    Attends Religious Services: More than 4 times per year    Active Member of Clubs or Organizations: Yes    Attends Banker Meetings: More than 4 times per year    Marital Status: Divorced  Intimate Partner Violence: Not At Risk (03/22/2023)   Humiliation, Afraid, Rape, and Kick questionnaire    Fear of Current or Ex-Partner: No    Emotionally Abused: No    Physically Abused: No    Sexually Abused: No     Review of Systems  Gastrointestinal:  Positive for abdominal pain.  All other systems reviewed and are negative.      Objective:   Physical Exam Vitals reviewed.  Constitutional:      General: She is not in acute distress.    Appearance: Normal appearance. She is normal weight. She is not ill-appearing, toxic-appearing or diaphoretic.  HENT:     Right Ear: Drainage, swelling and tenderness present.     Nose: No congestion or rhinorrhea.     Mouth/Throat:     Mouth: Mucous membranes are moist. No oral lesions.     Pharynx: Oropharynx is clear. Uvula midline. No pharyngeal swelling, oropharyngeal exudate, posterior oropharyngeal erythema or uvula swelling.     Tonsils: No tonsillar exudate.  Eyes:     Extraocular Movements: Extraocular movements intact.     Right eye: Normal extraocular motion.     Left eye: Normal extraocular motion.     Pupils: Pupils are equal, round, and reactive to light.  Neck:     Vascular: No carotid bruit.  Cardiovascular:     Rate and Rhythm: Tachycardia present. Rhythm irregular.     Pulses: Normal pulses.     Heart sounds:  Normal heart sounds. No murmur heard.    No friction rub. No gallop.  Pulmonary:     Effort: Pulmonary effort is normal. No respiratory distress.     Breath sounds: Normal breath sounds. No stridor. No wheezing, rhonchi or rales.  Abdominal:     General: Abdomen is flat. Bowel sounds are decreased. There is no distension.     Palpations:  Abdomen is soft.     Tenderness: There is no abdominal tenderness. There is no guarding or rebound.     Hernia: No hernia is present.  Musculoskeletal:     Cervical back: Neck supple.  Lymphadenopathy:     Cervical: No cervical adenopathy.  Neurological:     General: No focal deficit present.     Mental Status: She is alert and oriented to person, place, and time. Mental status is at baseline.     Cranial Nerves: No cranial nerve deficit.     Sensory: No sensory deficit.     Motor: No weakness.     Coordination: Coordination normal.     Gait: Gait normal.     Deep Tendon Reflexes: Reflexes normal.  Psychiatric:        Mood and Affect: Mood normal.        Behavior: Behavior normal.        Thought Content: Thought content normal.        Judgment: Judgment normal.           Assessment & Plan:  Tachycardia  Acute swimmer's ear of right side Treat otitis externa with Cortisporin HC otic drops, 3 drops in the right ear canal 4 times a day.  However my biggest concern is her tachycardia.  Recommend the patient go home immediately and take her metoprolol.  Recheck heart rate later this afternoon.  I believe the tachycardia is likely due to the patient's noncompliance and missing her metoprolol allowing the atrial fibrillation to cause a heart rate that is uncontrolled

## 2023-05-18 ENCOUNTER — Ambulatory Visit: Payer: 59 | Admitting: Cardiovascular Disease

## 2023-05-25 ENCOUNTER — Encounter: Payer: Self-pay | Admitting: Pharmacist

## 2023-05-25 NOTE — Progress Notes (Signed)
Patient previously followed by [facility] pharmacist. Per clinical review, no pharmacist appointment needed at this time.

## 2023-05-27 ENCOUNTER — Other Ambulatory Visit: Payer: Self-pay | Admitting: Cardiovascular Disease

## 2023-06-01 ENCOUNTER — Other Ambulatory Visit: Payer: Self-pay | Admitting: Cardiovascular Disease

## 2023-06-01 DIAGNOSIS — I480 Paroxysmal atrial fibrillation: Secondary | ICD-10-CM

## 2023-06-04 NOTE — Telephone Encounter (Signed)
Prescription refill request for Eliquis received. Indication:afib Last office visit:upcoming Scr:1.84  5/24 Age: 77 Weight:73  kg  Prescription refilled

## 2023-06-05 ENCOUNTER — Encounter: Payer: 59 | Admitting: Pharmacist

## 2023-06-09 ENCOUNTER — Ambulatory Visit
Admission: EM | Admit: 2023-06-09 | Discharge: 2023-06-09 | Disposition: A | Discharge status: Home / Self Care | Payer: 59 | Attending: Physician Assistant | Admitting: Physician Assistant

## 2023-06-09 ENCOUNTER — Encounter: Payer: Self-pay | Admitting: Emergency Medicine

## 2023-06-09 DIAGNOSIS — M25531 Pain in right wrist: Secondary | ICD-10-CM

## 2023-06-09 MED ORDER — PREDNISONE 20 MG PO TABS: 40.0000 mg | ORAL_TABLET | Freq: Every day | ORAL | 0 refills | Status: AC

## 2023-06-09 NOTE — ED Triage Notes (Addendum)
Right wrist pain and swelling x 1 week. States this happened a few months back and was told it was arthritis, was given cream and pills with improvement of symptoms. Denies injury or repetitive use of area. Swelling visible to posterior distal forearm. Does report some associated loss of grip strength. Denies numbness/tingling

## 2023-06-09 NOTE — ED Provider Notes (Signed)
[facility]    CSN: [id number] Arrival date & time: 06/09/23  0941      History   Chief Complaint Chief Complaint  Patient presents with   Wrist Pain    HPI Christine Hogan is a 77 y.o. female.   Patient here today for evaluation of right wrist swelling that started over the last week.  She states this has happened in the past and she was told it was likely due to arthritis.  She states that she has tried Tylenol without improvement.  She has not any numbness or tingling.  She does not currently see ortho.  The history is provided by the patient.  Wrist Pain Pertinent negatives include no abdominal pain and no shortness of breath.    Past Medical History:  Diagnosis Date   Arthritis    Atrial fibrillation (HCC)    Chest pain    a. 2014: NST with no evidence of ischemia   CKD (chronic kidney disease) stage 3, GFR 30-59 ml/min (HCC)    Diastolic dysfunction    grade 2 with dyspnea on exertion   H/O echocardiogram    a. 2014: echo showing EF of 65-70% with Grade 1 DD, moderate TR, and mild to moderate MR   Hyperlipidemia    Hypertension    Near syncope    Osteopenia     Patient Active Problem List   Diagnosis Date Noted   Viral URI with cough 03/20/2023   CKD (chronic kidney disease) stage 3, GFR 30-59 ml/min (HCC)    Abnormal finding on MRI of brain 09/15/2019   Alteration of consciousness 08/26/2019   Syncope 05/26/2019   Nonrheumatic mitral (valve) insufficiency 10/08/2018   Vasovagal syncope 10/08/2018   Osteopenia    Diastolic dysfunction    Paroxysmal atrial fibrillation (HCC) 02/09/2017   Chronic renal insufficiency 06/28/2013   Chest pain 06/27/2013   Essential hypertension    Mixed hyperlipidemia     Past Surgical History:  Procedure Laterality Date   ABDOMINAL HYSTERECTOMY     TAH/BSO (menorrhagia)   bunion repair     HERNIA REPAIR      OB History   No obstetric history on file.      Home Medications    Prior to Admission  medications   Medication Sig Start Date End Date Taking? Authorizing Provider  predniSONE (DELTASONE) 20 MG tablet Take 2 tablets (40 mg total) by mouth daily with breakfast for 5 days. 06/09/23 06/14/23 Yes Tomi Bamberger, PA-C  amLODipine (NORVASC) 10 MG tablet TAKE 1 TABLET BY MOUTH DAILY 10/25/22   Donita Brooks, MD  diclofenac Sodium (VOLTAREN) 1 % GEL Apply 2 g topically 4 (four) times daily. 05/26/22   Valinda Hoar, NP  ELIQUIS 5 MG TABS tablet TAKE 1 TABLET BY MOUTH TWICE  DAILY 06/04/23   O'Neal, Ronnald Ramp, MD  lidocaine (XYLOCAINE) 5 % ointment Apply 1 Application topically as needed. 05/26/22   White, Elita Boone, NP  losartan (COZAAR) 100 MG tablet TAKE 1 TABLET BY MOUTH EVERY DAY 04/13/23   Donita Brooks, MD  metoprolol tartrate (LOPRESSOR) 50 MG tablet TAKE 1 TABLET (50 MG TOTAL) BY MOUTH 2 (TWO) TIMES DAILY. PLEASE KEEP YOUR UPCOMING APPOINTMENT WITH DR. C TO RECEIVE THE REST OF YOUR MEDICATION REFILLS. 05/28/23   Croitoru, Mihai, MD  Multiple Vitamins-Minerals (CENTRUM SILVER 50+WOMEN) TABS Take 1 tablet by mouth daily with breakfast.    [provider]  neomycin-polymyxin-hydrocortisone (CORTISPORIN) OTIC solution Place 3 drops  into the right ear 4 (four) times daily. 04/26/23   Donita Brooks, MD  rosuvastatin (CRESTOR) 10 MG tablet TAKE 1 TABLET BY MOUTH ONCE  DAILY 11/23/22   Donita Brooks, MD    Family History Family History  Problem Relation Age of Onset   Kidney failure Mother    Hypertension Father    Heart disease Father        pacemaker- followedby Dr. Royann Shivers   Diabetes Sister    Hypertension Sister    Diabetes Sister    Hypertension Sister    Hypertension Daughter    Hypertension Daughter    Breast cancer Daughter     Social History Social History   Tobacco Use   Smoking status: Never   Smokeless tobacco: Never  Vaping Use   Vaping status: Never Used  Substance Use Topics   Alcohol use: Yes    Comment: occasionally   Drug  use: No     Allergies   Codeine and Codeine   Review of Systems Review of Systems  Constitutional:  Negative for chills and fever.  Eyes:  Negative for discharge and redness.  Respiratory:  Negative for shortness of breath.   Gastrointestinal:  Negative for abdominal pain, nausea and vomiting.  Musculoskeletal:  Positive for arthralgias and joint swelling.  Neurological:  Negative for numbness.     Physical Exam Triage Vital Signs ED Triage Vitals  Encounter Vitals Group     BP 06/09/23 0947 (!) 149/80     Systolic BP Percentile --      Diastolic BP Percentile --      Pulse Rate 06/09/23 0947 64     Resp 06/09/23 0947 16     Temp 06/09/23 0947 97.8 F (36.6 C)     Temp Source 06/09/23 0947 Oral     SpO2 06/09/23 0947 96 %     Weight --      Height --      Head Circumference --      Peak Flow --      Pain Score 06/09/23 0948 8     Pain Loc --      Pain Education --      Exclude from Growth Chart --    No data found.  Updated Vital Signs BP (!) 149/80 (BP Location: Right Arm)   Pulse 64   Temp 97.8 F (36.6 C) (Oral)   Resp 16   SpO2 96%      Physical Exam Vitals and nursing note reviewed.  Constitutional:      General: She is not in acute distress.    Appearance: Normal appearance. She is not ill-appearing.  HENT:     Head: Normocephalic and atraumatic.  Eyes:     Conjunctiva/sclera: Conjunctivae normal.  Cardiovascular:     Rate and Rhythm: Normal rate.  Pulmonary:     Effort: Pulmonary effort is normal. No respiratory distress.  Musculoskeletal:     Comments: Mild swelling diffuse to right wrist, minimal TTP noted to dorsal and palmar wrist, no bruising or erythema noted. Full ROM of right fingers, mildly decreased ROM of R wrist in all directions due to pain.  Skin:    Capillary Refill: Normal cap refill to right fingers Neurological:     Mental Status: She is alert.     Comments: Gross sensation intact to distal right fingers  Psychiatric:         Mood and Affect: Mood normal.  Behavior: Behavior normal.        Thought Content: Thought content normal.      UC Treatments / Results  Labs (all labs ordered are listed, but only abnormal results are displayed) Labs Reviewed - No data to display  EKG   Radiology No results found.  Procedures Procedures (including critical care time)  Medications Ordered in UC Medications - No data to display  Initial Impression / Assessment and Plan / UC Course  I have reviewed the triage vital signs and the nursing notes.  Pertinent labs & imaging results that were available during my care of the patient were reviewed by me and considered in my medical decision making (see chart for details).    Steroid burst prescribed for suspected inflammatory process- recommended further evaluation by ortho given recurrence. Contact info provided. Encouraged follow up with any further concerns.   Final Clinical Impressions(s) / UC Diagnoses   Final diagnoses:  Right wrist pain   Discharge Instructions   None    ED Prescriptions     Medication Sig Dispense Auth. Provider   predniSONE (DELTASONE) 20 MG tablet Take 2 tablets (40 mg total) by mouth daily with breakfast for 5 days. 10 tablet Tomi Bamberger, PA-C      PDMP not reviewed this encounter.   Tomi Bamberger, PA-C 06/09/23 1109

## 2023-06-14 ENCOUNTER — Other Ambulatory Visit: Payer: Self-pay | Admitting: Family Medicine

## 2023-06-15 ENCOUNTER — Other Ambulatory Visit: Payer: 59

## 2023-06-15 ENCOUNTER — Ambulatory Visit: Payer: 59

## 2023-06-15 NOTE — Telephone Encounter (Signed)
Unable to refill per protocol, Rx expired. Discontinued 06/16/22.  Requested Prescriptions  Pending Prescriptions Disp Refills   hydrochlorothiazide (HYDRODIURIL) 12.5 MG tablet [Pharmacy Med Name: HYDROCHLOROTHIAZIDE 12.5 MG TB] 90 tablet 3    Sig: TAKE 1 TABLET BY MOUTH EVERY DAY     Cardiovascular: Diuretics - Thiazide Failed - 06/14/2023  2:41 AM      Failed - Cr in normal range and within 180 days    Creat  Date Value Ref Range Status  03/26/2023 1.84 (H) 0.60 - 1.00 mg/dL Final         Failed - Last BP in normal range    BP Readings from Last 1 Encounters:  06/09/23 (!) 149/80         Failed - Valid encounter within last 6 months    Recent Outpatient Visits           1 year ago Acute swimmer's ear of right side   [facility] Pickard, Priscille Heidelberg, MD   1 year ago Generalized abdominal pain   [facility] Tanya Nones, Priscille Heidelberg, MD   1 year ago Acute pain of right knee   [facility] Tanya Nones, Priscille Heidelberg, MD   1 year ago Dermatitis   [facility] Valentino Nose, NP   1 year ago Syncope, unspecified syncope type   [facility] Donita Brooks, MD       Future Appointments             In 3 days Donita Brooks, MD [facility]   In 4 months Croitoru, Rachelle Hora, MD [facility] - K in normal range and within 180 days    Potassium  Date Value Ref Range Status  03/26/2023 3.9 3.5 - 5.3 mmol/L Final         Passed - Na in normal range and within 180 days    Sodium  Date Value Ref Range Status  03/26/2023 142 135 - 146 mmol/L Final  07/14/2020 140 134 - 144 mmol/L Final

## 2023-06-18 ENCOUNTER — Ambulatory Visit (INDEPENDENT_AMBULATORY_CARE_PROVIDER_SITE_OTHER): Payer: 59 | Admitting: Family Medicine

## 2023-06-18 ENCOUNTER — Encounter: Payer: Self-pay | Admitting: Family Medicine

## 2023-06-18 VITALS — BP 130/70 | HR 57 | Temp 97.5°F | Ht 65.0 in | Wt 158.4 lb

## 2023-06-18 DIAGNOSIS — B351 Tinea unguium: Secondary | ICD-10-CM

## 2023-06-18 DIAGNOSIS — L600 Ingrowing nail: Secondary | ICD-10-CM | POA: Diagnosis not present

## 2023-06-18 MED ORDER — TERBINAFINE HCL 250 MG PO TABS: 250.0000 mg | ORAL_TABLET | Freq: Every day | ORAL | 2 refills | Status: AC

## 2023-06-18 NOTE — Progress Notes (Signed)
Subjective:    Patient ID: Christine Hogan, female    DOB: 28-Jul-1946, 77 y.o.   MRN: [id number]  Patient reports 1 week history of pain in both big toes left greater than right.  The pain located in the skin adjacent to medial and lateral nail folds.  Skin is erythematous or swollen or infected.  There is no paronychia.  However it is tender to palpation and the patient has ingrown toenails bilateral.  The toenails are misshapen due to onychomycosis.  The toenails themselves are white, opaque, and dysmorphic.  The other toenails on the adjacent toes are black and dysmorphic Past Medical History:  Diagnosis Date   Arthritis    Atrial fibrillation (HCC)    Chest pain    a. 2014: NST with no evidence of ischemia   CKD (chronic kidney disease) stage 3, GFR 30-59 ml/min (HCC)    Diastolic dysfunction    grade 2 with dyspnea on exertion   H/O echocardiogram    a. 2014: echo showing EF of 65-70% with Grade 1 DD, moderate TR, and mild to moderate MR   Hyperlipidemia    Hypertension    Near syncope    Osteopenia    Past Surgical History:  Procedure Laterality Date   ABDOMINAL HYSTERECTOMY     TAH/BSO (menorrhagia)   bunion repair     HERNIA REPAIR     Current Outpatient Medications on File Prior to Visit  Medication Sig Dispense Refill   amLODipine (NORVASC) 10 MG tablet TAKE 1 TABLET BY MOUTH DAILY 70 tablet 4   diclofenac Sodium (VOLTAREN) 1 % GEL Apply 2 g topically 4 (four) times daily. 50 g 0   ELIQUIS 5 MG TABS tablet TAKE 1 TABLET BY MOUTH TWICE  DAILY 200 tablet 2   lidocaine (XYLOCAINE) 5 % ointment Apply 1 Application topically as needed. 35.44 g 0   losartan (COZAAR) 100 MG tablet TAKE 1 TABLET BY MOUTH EVERY DAY 90 tablet 1   metoprolol tartrate (LOPRESSOR) 50 MG tablet TAKE 1 TABLET (50 MG TOTAL) BY MOUTH 2 (TWO) TIMES DAILY. PLEASE KEEP YOUR UPCOMING APPOINTMENT WITH DR. C TO RECEIVE THE REST OF YOUR MEDICATION REFILLS. 70 tablet 1   Multiple Vitamins-Minerals (CENTRUM SILVER  50+WOMEN) TABS Take 1 tablet by mouth daily with breakfast.     neomycin-polymyxin-hydrocortisone (CORTISPORIN) OTIC solution Place 3 drops into the right ear 4 (four) times daily. 10 mL 0   rosuvastatin (CRESTOR) 10 MG tablet TAKE 1 TABLET BY MOUTH ONCE  DAILY 100 tablet 2   No current facility-administered medications on file prior to visit.   Allergies  Allergen Reactions   Codeine Nausea And Vomiting   Codeine Nausea And Vomiting and Other (See Comments)    Dizziness, also   Social History   Socioeconomic History   Marital status: Divorced    Spouse name: Not on file   Number of children: 5   Years of education: 12th grade   Highest education level: Not on file  Occupational History   Occupation: Retired  Tobacco Use   Smoking status: Never   Smokeless tobacco: Never  Vaping Use   Vaping status: Never Used  Substance and Sexual Activity   Alcohol use: Yes    Comment: occasionally   Drug use: No   Sexual activity: Yes  Other Topics Concern   Not on file  Social History Narrative   ** Merged History Encounter **       Lives with her son. Right-handed.  Four living children. Occasional caffeine use.   Social Determinants of Health   Financial Resource Strain: Low Risk  (03/22/2023)   Overall Financial Resource Strain (CARDIA)    Difficulty of Paying Living Expenses: Not hard at all  Food Insecurity: No Food Insecurity (03/22/2023)   Hunger Vital Sign    Worried About Running Out of Food in the Last Year: Never true    Ran Out of Food in the Last Year: Never true  Transportation Needs: No Transportation Needs (03/22/2023)   PRAPARE - Administrator, Civil Service (Medical): No    Lack of Transportation (Non-Medical): No  Physical Activity: Insufficiently Active (03/22/2023)   Exercise Vital Sign    Days of Exercise per Week: 3 days    Minutes of Exercise per Session: 30 min  Stress: No Stress Concern Present (03/22/2023)   [employer] of  Occupational Health - Occupational Stress Questionnaire    Feeling of Stress : Not at all  Social Connections: Moderately Integrated (03/22/2023)   Social Connection and Isolation Panel [NHANES]    Frequency of Communication with Friends and Family: More than three times a week    Frequency of Social Gatherings with Friends and Family: Three times a week    Attends Religious Services: More than 4 times per year    Active Member of Clubs or Organizations: Yes    Attends Banker Meetings: More than 4 times per year    Marital Status: Divorced  Intimate Partner Violence: Not At Risk (03/22/2023)   Humiliation, Afraid, Rape, and Kick questionnaire    Fear of Current or Ex-Partner: No    Emotionally Abused: No    Physically Abused: No    Sexually Abused: No     Review of Systems  All other systems reviewed and are negative.      Objective:   Physical Exam Vitals reviewed.  Constitutional:      General: She is not in acute distress.    Appearance: Normal appearance. She is normal weight. She is not ill-appearing, toxic-appearing or diaphoretic.  Eyes:     Extraocular Movements: Extraocular movements intact.     Pupils: Pupils are equal, round, and reactive to light.  Neck:     Vascular: No carotid bruit.  Cardiovascular:     Rate and Rhythm: Normal rate and regular rhythm.     Pulses: Normal pulses.     Heart sounds: Normal heart sounds. No murmur heard.    No friction rub. No gallop.  Pulmonary:     Effort: Pulmonary effort is normal. No respiratory distress.     Breath sounds: Normal breath sounds. No stridor. No wheezing, rhonchi or rales.  Abdominal:     General: Abdomen is flat. Bowel sounds are normal. There is no distension.     Palpations: Abdomen is soft.     Tenderness: There is no abdominal tenderness. There is no guarding or rebound.     Hernia: No hernia is present.  Neurological:     General: No focal deficit present.     Mental Status: She is  alert and oriented to person, place, and time. Mental status is at baseline.     Cranial Nerves: No cranial nerve deficit.     Sensory: No sensory deficit.     Motor: No weakness.     Coordination: Coordination normal.     Gait: Gait normal.     Deep Tendon Reflexes: Reflexes normal.  Psychiatric:  Mood and Affect: Mood normal.        Behavior: Behavior normal.        Thought Content: Thought content normal.        Judgment: Judgment normal.   Please see the history of present illness        Assessment & Plan:  Onychomycosis  Ingrown toenail without infection Patient had bilateral ingrown toenails due to onychomycosis.  There is no evidence of any infection.  I offered the patient removal of the ingrown portion of the nail but at the present time, the patient states the pain is not that severe.  She will treat the pain with topical Voltaren and we will treat the onychomycosis with Lamisil 250 mg daily for 3 months.  I warned the patient to check her liver function test monthly on the medication and avoid alcohol

## 2023-07-05 ENCOUNTER — Ambulatory Visit: Payer: Self-pay | Admitting: *Deleted

## 2023-07-05 ENCOUNTER — Telehealth: Payer: Self-pay | Admitting: Family Medicine

## 2023-07-05 NOTE — Telephone Encounter (Signed)
  Chief Complaint: Can she drink wine with Lamisil.    I let her know she should not drink any alcohol while taking Lamisil. Symptoms: N/A Frequency: N/A Pertinent Negatives: Patient denies N/A Disposition: [] ED /[] Urgent Care (no appt availability in office) / [] Appointment(In office/virtual)/ []  [facility]/ [x] Home Care/ [] Refused Recommended Disposition /[] [facility]/ []  Follow-up with PCP Additional Notes: I answered her question and she thanked me for my help.

## 2023-07-05 NOTE — Telephone Encounter (Signed)
Reason for Disposition  Caller has medicine question only, adult not sick, AND triager answers question  Answer Assessment - Initial Assessment Questions 1. NAME of MEDICINE: "What medicine(s) are you calling about?"     Lamisil  2. QUESTION: "What is your question?" (e.g., double dose of medicine, side effect)     Can I drink wine while taking this?    I know I can't have alcohol or hard liquor but can I have wine? I let her know she should not drink any alcohol while taking the Lamisil.  She thanked me for my help.  "I'm doing so good so I did not want to mess things up".    3. PRESCRIBER: "Who prescribed the medicine?" Reason: if prescribed by specialist, call should be referred to that group.     Dr. Tanya Nones 4. SYMPTOMS: "Do you have any symptoms?" If Yes, ask: "What symptoms are you having?"  "How bad are the symptoms (e.g., mild, moderate, severe)     It's for her toenail fungus. 5. PREGNANCY:  "Is there any chance that you are pregnant?" "When was your last menstrual period?"     N/A due to age  Protocols used: Medication Question Call-A-AH

## 2023-07-05 NOTE — Telephone Encounter (Signed)
Patient called to ask if she can drink wine while taking medication to treat toenail fungus; stated she's clear she can't consume alcohol.  Please advise at [phone].

## 2023-07-18 ENCOUNTER — Ambulatory Visit (INDEPENDENT_AMBULATORY_CARE_PROVIDER_SITE_OTHER): Payer: 59 | Admitting: Family Medicine

## 2023-07-18 VITALS — BP 118/68 | HR 57 | Temp 98.3°F | Ht 65.0 in | Wt 155.2 lb

## 2023-07-18 DIAGNOSIS — B351 Tinea unguium: Secondary | ICD-10-CM

## 2023-07-18 DIAGNOSIS — H9191 Unspecified hearing loss, right ear: Secondary | ICD-10-CM | POA: Insufficient documentation

## 2023-07-18 DIAGNOSIS — Z79899 Other long term (current) drug therapy: Secondary | ICD-10-CM

## 2023-07-18 NOTE — Assessment & Plan Note (Signed)
Patinets symptoms improved with cerumen disempaction by flushing. There was some moisture remaining in her ear but no redness of the canal or TM, bulging, or effusion observed. No tenderness on exam. Encouraged to return to office if her symptoms return or new symptoms appear.

## 2023-07-18 NOTE — Progress Notes (Signed)
Subjective:  HPI: Christine Hogan is a 77 y.o. female presenting on 07/18/2023 for Hearing Problem (Pt complains of right ear being clogged, difficulty hearing; labs./Pt would like to go to [facility] for Flu shot. Pt denied shingles vaccine. )   HPI Patient is in today for diminished hearing and stopped up ear in the right side. Described as "roaring" sound. She was recently treated for swimmers ear with drops and completed this course of treatment. She denies ear pain, drainage, fever, body aches, or chills. She would also like to get her liver function checked while she is here, she is on Lamisil for Onychomycosis.  Review of Systems  All other systems reviewed and are negative.   Relevant past medical history reviewed and updated as indicated.   Past Medical History:  Diagnosis Date   Arthritis    Atrial fibrillation (HCC)    Chest pain    a. 2014: NST with no evidence of ischemia   CKD (chronic kidney disease) stage 3, GFR 30-59 ml/min (HCC)    Diastolic dysfunction    grade 2 with dyspnea on exertion   H/O echocardiogram    a. 2014: echo showing EF of 65-70% with Grade 1 DD, moderate TR, and mild to moderate MR   Hyperlipidemia    Hypertension    Near syncope    Osteopenia      Past Surgical History:  Procedure Laterality Date   ABDOMINAL HYSTERECTOMY     TAH/BSO (menorrhagia)   bunion repair     HERNIA REPAIR      Allergies and medications reviewed and updated.   Current Outpatient Medications:    amLODipine (NORVASC) 10 MG tablet, TAKE 1 TABLET BY MOUTH DAILY, Disp: 70 tablet, Rfl: 4   diclofenac Sodium (VOLTAREN) 1 % GEL, Apply 2 g topically 4 (four) times daily., Disp: 50 g, Rfl: 0   ELIQUIS 5 MG TABS tablet, TAKE 1 TABLET BY MOUTH TWICE  DAILY, Disp: 200 tablet, Rfl: 2   lidocaine (XYLOCAINE) 5 % ointment, Apply 1 Application topically as needed., Disp: 35.44 g, Rfl: 0   losartan (COZAAR) 100 MG tablet, TAKE 1 TABLET BY MOUTH EVERY DAY, Disp: 90 tablet, Rfl: 1    metoprolol tartrate (LOPRESSOR) 50 MG tablet, TAKE 1 TABLET (50 MG TOTAL) BY MOUTH 2 (TWO) TIMES DAILY. PLEASE KEEP YOUR UPCOMING APPOINTMENT WITH DR. C TO RECEIVE THE REST OF YOUR MEDICATION REFILLS., Disp: 70 tablet, Rfl: 1   Multiple Vitamins-Minerals (CENTRUM SILVER 50+WOMEN) TABS, Take 1 tablet by mouth daily with breakfast., Disp: , Rfl:    neomycin-polymyxin-hydrocortisone (CORTISPORIN) OTIC solution, Place 3 drops into the right ear 4 (four) times daily., Disp: 10 mL, Rfl: 0   rosuvastatin (CRESTOR) 10 MG tablet, TAKE 1 TABLET BY MOUTH ONCE  DAILY, Disp: 100 tablet, Rfl: 2   terbinafine (LAMISIL) 250 MG tablet, Take 1 tablet (250 mg total) by mouth daily., Disp: 30 tablet, Rfl: 2  Allergies  Allergen Reactions   Codeine Nausea And Vomiting   Codeine Nausea And Vomiting and Other (See Comments)    Dizziness, also    Objective:   BP 118/68   Pulse (!) 57   Temp 98.3 F (36.8 C) (Oral)   Ht 5\' 5"  (1.651 m)   Wt 155 lb 3.2 oz (70.4 kg)   SpO2 100%   BMI 25.83 kg/m      07/18/2023    3:04 PM 06/18/2023    7:59 AM 06/09/2023    9:47 AM  Vitals with  BMI  Height 5\' 5"  5\' 5"    Weight 155 lbs 3 oz 158 lbs 6 oz   BMI 25.83 26.36   Systolic 118 130 220  Diastolic 68 70 80  Pulse 57 57 64     Physical Exam Vitals and nursing note reviewed.  Constitutional:      Appearance: Normal appearance. She is normal weight.  HENT:     Head: Normocephalic and atraumatic.     Right Ear: Tympanic membrane, ear canal and external ear normal. There is impacted cerumen.     Left Ear: Tympanic membrane, ear canal and external ear normal.     Ears:   Skin:    General: Skin is warm and dry.  Neurological:     General: No focal deficit present.     Mental Status: She is alert and oriented to person, place, and time. Mental status is at baseline.  Psychiatric:        Mood and Affect: Mood normal.        Behavior: Behavior normal.        Thought Content: Thought content normal.         Judgment: Judgment normal.     Assessment & Plan:  Decreased hearing of right ear Assessment & Plan: Patinets symptoms improved with cerumen disempaction by flushing. There was some moisture remaining in her ear but no redness of the canal or TM, bulging, or effusion observed. No tenderness on exam. Encouraged to return to office if her symptoms return or new symptoms appear.    Medication management -     COMPLETE METABOLIC PANEL WITH GFR  Onychomycosis -     COMPLETE METABOLIC PANEL WITH GFR     Follow up plan: Return if symptoms worsen or fail to improve.  Park Meo, FNP

## 2023-07-19 LAB — COMPLETE METABOLIC PANEL WITH GFR
AG Ratio: 1.8 (calc) (ref 1.0–2.5)
ALT: 13 U/L (ref 6–29)
AST: 16 U/L (ref 10–35)
Albumin: 4.4 g/dL (ref 3.6–5.1)
Alkaline phosphatase (APISO): 63 U/L (ref 37–153)
BUN/Creatinine Ratio: 11 (calc) (ref 6–22)
BUN: 17 mg/dL (ref 7–25)
CO2: 26 mmol/L (ref 20–32)
Calcium: 10.4 mg/dL (ref 8.6–10.4)
Chloride: 107 mmol/L (ref 98–110)
Creat: 1.53 mg/dL — ABNORMAL HIGH (ref 0.60–1.00)
Globulin: 2.5 g/dL (ref 1.9–3.7)
Glucose, Bld: 87 mg/dL (ref 65–99)
Potassium: 4.1 mmol/L (ref 3.5–5.3)
Sodium: 141 mmol/L (ref 135–146)
Total Bilirubin: 0.6 mg/dL (ref 0.2–1.2)
Total Protein: 6.9 g/dL (ref 6.1–8.1)
eGFR: 35 mL/min/{1.73_m2} — ABNORMAL LOW (ref >=60)

## 2023-08-03 ENCOUNTER — Telehealth: Payer: Self-pay

## 2023-08-03 NOTE — Telephone Encounter (Signed)
1st attempt to reach pt regarding surgical clearance and the need for an IN OFFICE appointment.  Left pt a message to call and get that scheduled.  Pt is scheduled to see Dr. Royann Shivers in December, if pt wants clearance before then, will need another appointment.

## 2023-08-03 NOTE — Telephone Encounter (Signed)
Name: Christine Hogan  DOB: [birth year]  MRN: [id number]  Primary Cardiologist: Thurmon Fair, MD  Chart reviewed as part of pre-operative protocol coverage. Because of [name] past medical history and time since last visit, she will require a follow-up in-office visit in order to better assess preoperative cardiovascular risk.  Pre-op covering staff: - Please schedule appointment and call patient to inform them. If patient already had an upcoming appointment within acceptable timeframe, please add "pre-op clearance" to the appointment notes so provider is aware. - Please contact requesting surgeon's office via preferred method (i.e, phone, fax) to inform them of need for appointment prior to surgery.   We have not seen this patient since 2022. While dental extractions of 1-2 teeth are considered low risk procedures per guidelines and generally do not require any specific cardiac clearance or interruption of anticoagulation, we can make no specific recommendations for her until seen in our office.     [name], PA  08/03/2023, 11:47 AM

## 2023-08-03 NOTE — Telephone Encounter (Signed)
..  Pre-operative Risk Assessment    Patient Name: Christine Hogan  DOB: Jul 25, 1946 MRN: [id number]     Request for Surgical Clearance    Procedure:  Dental Extraction - Amount of Teeth to be Pulled:  1  Date of Surgery:  Clearance TBD                                 Surgeon:  Jaynie Bream Group or Practice Name:  [facility] Phone number:  [phone] Fax number:  [phone]    Type of Clearance Requested:   - Medical  - Pharmacy:  Hold Apixaban (Eliquis)     Type of Anesthesia:  Not Indicated   Additional requests/questions:    [name]   08/03/2023, 11:16 AM

## 2023-08-06 NOTE — Telephone Encounter (Signed)
Pt has been scheduled in office appt with Azalee Course, [facility] 08/10/23 for pre op clearance.

## 2023-08-10 ENCOUNTER — Ambulatory Visit: Payer: 59 | Attending: Physician Assistant | Admitting: Physician Assistant

## 2023-08-10 VITALS — BP 116/74 | HR 46 | Ht 60.5 in | Wt 154.0 lb

## 2023-08-10 DIAGNOSIS — M79604 Pain in right leg: Secondary | ICD-10-CM

## 2023-08-10 DIAGNOSIS — Z01810 Encounter for preprocedural cardiovascular examination: Secondary | ICD-10-CM | POA: Diagnosis not present

## 2023-08-10 DIAGNOSIS — I480 Paroxysmal atrial fibrillation: Secondary | ICD-10-CM | POA: Diagnosis not present

## 2023-08-10 DIAGNOSIS — R001 Bradycardia, unspecified: Secondary | ICD-10-CM | POA: Diagnosis not present

## 2023-08-10 MED ORDER — METOPROLOL TARTRATE 25 MG PO TABS: 25.0000 mg | ORAL_TABLET | Freq: Two times a day (BID) | ORAL | 3 refills | Status: DC
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
## 2023-08-10 NOTE — Patient Instructions (Signed)
Medication Instructions:  DECREASE METOPROLOL TARTRATE TO 25 MG TWICE A DAY. *If you need a refill on your cardiac medications before your next appointment, please call your pharmacy*   Lab Work: NO LABS If you have labs (blood work) drawn today and your tests are completely normal, you will receive your results only by: MyChart Message (if you have MyChart) OR A paper copy in the mail If you have any lab test that is abnormal or we need to change your treatment, we will call you to review the results.   Testing/Procedures: NO TESTING   Follow-Up: At [facility], you and your health needs are our priority.  As part of our continuing mission to provide you with exceptional heart care, we have created designated Provider Care Teams.  These Care Teams include your primary Cardiologist (physician) and Advanced Practice Providers (APPs -  Physician Assistants and Nurse Practitioners) who all work together to provide you with the care you need, when you need it.  We recommend signing up for the patient portal called "MyChart".  Sign up information is provided on this After Visit Summary.  MyChart is used to connect with patients for Virtual Visits (Telemedicine).  Patients are able to view lab/test results, encounter notes, upcoming appointments, etc.  Non-urgent messages can be sent to your provider as well.   To learn more about what you can do with MyChart, go to [email or web address].    Your next appointment:   KEEP FOLLOW UP October 22 2023  Provider:   Thurmon Fair, MD

## 2023-08-10 NOTE — Telephone Encounter (Signed)
Cleared. Paper clearance letter handed to patient.

## 2023-08-17 ENCOUNTER — Other Ambulatory Visit: Payer: 59

## 2023-08-17 ENCOUNTER — Other Ambulatory Visit: Payer: Self-pay | Admitting: Cardiovascular Disease

## 2023-08-17 DIAGNOSIS — N183 Chronic kidney disease, stage 3 unspecified: Secondary | ICD-10-CM

## 2023-08-17 DIAGNOSIS — I10 Essential (primary) hypertension: Secondary | ICD-10-CM | POA: Diagnosis not present

## 2023-08-17 DIAGNOSIS — D649 Anemia, unspecified: Secondary | ICD-10-CM | POA: Diagnosis not present

## 2023-08-22 ENCOUNTER — Other Ambulatory Visit: Payer: Self-pay

## 2023-08-22 DIAGNOSIS — N183 Chronic kidney disease, stage 3 unspecified: Secondary | ICD-10-CM

## 2023-08-22 LAB — TEST AUTHORIZATION

## 2023-08-22 LAB — CBC WITH DIFFERENTIAL/PLATELET
Absolute Monocytes: 598 {cells}/uL (ref 200–950)
Basophils Absolute: 9 {cells}/uL (ref 0–200)
Basophils Relative: 0.2 %
Eosinophils Absolute: 60 {cells}/uL (ref 15–500)
Eosinophils Relative: 1.4 %
HCT: 31.9 % — ABNORMAL LOW (ref 35.0–45.0)
Hemoglobin: 10.2 g/dL — ABNORMAL LOW (ref 11.7–15.5)
Lymphs Abs: 1359 {cells}/uL (ref 850–3900)
MCH: 28 pg (ref 27.0–33.0)
MCHC: 32 g/dL (ref 32.0–36.0)
MCV: 87.6 fL (ref 80.0–100.0)
MPV: 9.9 fL (ref 7.5–12.5)
Monocytes Relative: 13.9 %
Neutro Abs: 2275 {cells}/uL (ref 1500–7800)
Neutrophils Relative %: 52.9 %
Platelets: 187 10*3/uL (ref 140–400)
RBC: 3.64 10*6/uL — ABNORMAL LOW (ref 3.80–5.10)
RDW: 13.1 % (ref 11.0–15.0)
Total Lymphocyte: 31.6 %
WBC: 4.3 10*3/uL (ref 3.8–10.8)

## 2023-08-22 LAB — COMPLETE METABOLIC PANEL WITH GFR
AG Ratio: 1.7 (calc) (ref 1.0–2.5)
ALT: 10 U/L (ref 6–29)
AST: 14 U/L (ref 10–35)
Albumin: 4.2 g/dL (ref 3.6–5.1)
Alkaline phosphatase (APISO): 64 U/L (ref 37–153)
BUN/Creatinine Ratio: 11 (calc) (ref 6–22)
BUN: 19 mg/dL (ref 7–25)
CO2: 25 mmol/L (ref 20–32)
Calcium: 10.3 mg/dL (ref 8.6–10.4)
Chloride: 107 mmol/L (ref 98–110)
Creat: 1.67 mg/dL — ABNORMAL HIGH (ref 0.60–1.00)
Globulin: 2.5 g/dL (ref 1.9–3.7)
Glucose, Bld: 89 mg/dL (ref 65–99)
Potassium: 4.2 mmol/L (ref 3.5–5.3)
Sodium: 140 mmol/L (ref 135–146)
Total Bilirubin: 0.7 mg/dL (ref 0.2–1.2)
Total Protein: 6.7 g/dL (ref 6.1–8.1)
eGFR: 32 mL/min/{1.73_m2} — ABNORMAL LOW (ref >=60)

## 2023-08-22 LAB — B12 AND FOLATE PANEL
Folate: >24 ng/mL
Vitamin B-12: 1258 pg/mL — ABNORMAL HIGH (ref 200–1100)

## 2023-08-22 LAB — IRON,TIBC AND FERRITIN PANEL
%SAT: 30 % (ref 16–45)
Ferritin: 208 ng/mL (ref 16–288)
Iron: 81 ug/dL (ref 45–160)
TIBC: 266 ug/dL (ref 250–450)

## 2023-08-23 ENCOUNTER — Other Ambulatory Visit: Payer: 59

## 2023-08-23 DIAGNOSIS — N183 Anemia in chronic kidney disease: Secondary | ICD-10-CM

## 2023-09-01 ENCOUNTER — Other Ambulatory Visit: Payer: Self-pay | Admitting: Family Medicine

## 2023-09-03 NOTE — Telephone Encounter (Signed)
Requested Prescriptions  Pending Prescriptions Disp Refills   rosuvastatin (CRESTOR) 10 MG tablet [Pharmacy Med Name: Rosuvastatin Calcium 10 MG Oral Tablet] 100 tablet 1    Sig: TAKE 1 TABLET BY MOUTH ONCE  DAILY     Cardiovascular:  Antilipid - Statins 2 Failed - 09/01/2023 10:39 PM      Failed - Cr in normal range and within 360 days    Creat  Date Value Ref Range Status  08/17/2023 1.67 (H) 0.60 - 1.00 mg/dL Final         Failed - Valid encounter within last 12 months    Recent Outpatient Visits           1 year ago Acute swimmer's ear of right side   [facility] Pickard, Priscille Heidelberg, MD   1 year ago Generalized abdominal pain   [facility] Pickard, Priscille Heidelberg, MD   1 year ago Acute pain of right knee   [facility] Tanya Nones, Priscille Heidelberg, MD   1 year ago Dermatitis   [facility] Valentino Nose, NP   1 year ago Syncope, unspecified syncope type   [facility] Pickard, Priscille Heidelberg, MD       Future Appointments             In 1 month Croitoru, Rachelle Hora, MD [facility]            Failed - Lipid Panel in normal range within the last 12 months    Cholesterol, Total  Date Value Ref Range Status  07/14/2020 140 100 - 199 mg/dL Final  81/19/1478 295 <621 mg/dL Final   Cholesterol  Date Value Ref Range Status  12/18/2022 142 <200 mg/dL Final   LDL (calc)  Date Value Ref Range Status  07/10/2013 97 <100 mg/dL Final    Comment:    LDL-C is inaccurate if patient is nonfasting.   Reference Range: ---------------- Optimal:            <100 Near/Above Optimal: 100-129 Borderline High:    130-159 High:               160-189 Very High:          >=190       LDL Cholesterol (Calc)  Date Value Ref Range Status  12/18/2022 60 mg/dL (calc) Final    Comment:    Reference range: <100 . Desirable range <100 mg/dL for primary prevention;   <70 mg/dL for patients with  CHD or diabetic patients  with > or = 2 CHD risk factors. Marland Kitchen LDL-C is now calculated using the Martin-Hopkins  calculation, which is a validated novel method providing  better accuracy than the Friedewald equation in the  estimation of LDL-C.  Horald Pollen et al. Lenox Ahr. 3086;578(46): 2061-2068  (http://education.Quest[employer].com/faq/FAQ164)    HDL-C  Date Value Ref Range Status  07/10/2013 52 >=40 mg/dL Final   HDL  Date Value Ref Range Status  12/18/2022 52 > OR = 50 mg/dL Final  96/29/5284 48 >13 mg/dL Final   Triglycerides  Date Value Ref Range Status  12/18/2022 241 (H) <150 mg/dL Final    Comment:    . If a non-fasting specimen was collected, consider repeat triglyceride testing on a fasting specimen if clinically indicated.  Perry Mount et al. J. of Clin. Lipidol. 2015;9:129-169. Marland Kitchen   07/10/2013 210 (H) <150 mg/dL Final         Passed -  Patient is not pregnant

## 2023-10-04 ENCOUNTER — Other Ambulatory Visit: Payer: Self-pay | Admitting: Family Medicine

## 2023-10-08 NOTE — Telephone Encounter (Signed)
Requested Prescriptions  Pending Prescriptions Disp Refills   amLODipine (NORVASC) 10 MG tablet [Pharmacy Med Name: amLODIPine Besylate 10 MG Oral Tablet] 100 tablet 0    Sig: TAKE 1 TABLET BY MOUTH DAILY     Cardiovascular: Calcium Channel Blockers 2 Failed - 10/04/2023  1:05 AM      Failed - Valid encounter within last 6 months    Recent Outpatient Visits           1 year ago Acute swimmer's ear of right side   [facility] Pickard, Priscille Heidelberg, MD   1 year ago Generalized abdominal pain   [facility] Tanya Nones, Priscille Heidelberg, MD   1 year ago Acute pain of right knee   [facility] Donita Brooks, MD   1 year ago Dermatitis   [facility] Valentino Nose, NP   2 years ago Syncope, unspecified syncope type   [facility] Pickard, Priscille Heidelberg, MD       Future Appointments             In 2 weeks Croitoru, Rachelle Hora, MD [facility] - Last BP in normal range    BP Readings from Last 1 Encounters:  08/10/23 116/74         Passed - Last Heart Rate in normal range    Pulse Readings from Last 1 Encounters:  08/10/23 (!) 46

## 2023-10-18 ENCOUNTER — Other Ambulatory Visit: Payer: Self-pay

## 2023-10-18 NOTE — Patient Outreach (Signed)
Aging Gracefully Program  10/18/2023  Christine Hogan 07-02-1946 [id number]   [facility] Evaluation Interviewer made contact with patient. Aging Gracefully survey completed. Patient not eligable for AG program.   Vanice Sarah Care Management Assistant [phone]

## 2023-10-22 ENCOUNTER — Ambulatory Visit: Payer: 59 | Attending: Cardiovascular Disease | Admitting: Cardiovascular Disease

## 2023-10-22 ENCOUNTER — Encounter: Payer: Self-pay | Admitting: Cardiovascular Disease

## 2023-10-22 VITALS — BP 118/62 | HR 49 | Ht 65.0 in | Wt 151.0 lb

## 2023-10-22 DIAGNOSIS — N1832 Chronic kidney disease, stage 3b: Secondary | ICD-10-CM

## 2023-10-22 DIAGNOSIS — I10 Essential (primary) hypertension: Secondary | ICD-10-CM | POA: Diagnosis not present

## 2023-10-22 DIAGNOSIS — I480 Paroxysmal atrial fibrillation: Secondary | ICD-10-CM

## 2023-10-22 DIAGNOSIS — I340 Nonrheumatic mitral (valve) insufficiency: Secondary | ICD-10-CM | POA: Diagnosis not present

## 2023-10-22 DIAGNOSIS — R55 Syncope and collapse: Secondary | ICD-10-CM

## 2023-10-22 DIAGNOSIS — R011 Cardiac murmur, unspecified: Secondary | ICD-10-CM

## 2023-10-22 DIAGNOSIS — D6869 Other thrombophilia: Secondary | ICD-10-CM | POA: Diagnosis not present

## 2023-10-22 MED ORDER — METOPROLOL TARTRATE 25 MG PO TABS: 12.5000 mg | ORAL_TABLET | Freq: Two times a day (BID) | ORAL | 3 refills | Status: DC

## 2023-10-22 NOTE — Progress Notes (Unsigned)
Cardiology Office Note    Date:  10/27/2023   ID:  [name], Christine Hogan 25-Feb-1946, MRN [id number]  PCP:  Donita Brooks, MD  Cardiologist:   Thurmon Fair, MD   Chief Complaint  Patient presents with   Hypertension   Atrial Fibrillation    History of Present Illness:  Christine Hogan is a 76 y.o. female with a history of previous syncope going back to adolescence with a pattern suggestive of neurally mediated events.  She also has systemic hypertension and incidentally discovered rate controlled paroxysmal atrial fibrillation seen on event monitor in 2017.  Her father Christine Hogan was my patient in the past (He passed away with intracranial hemorrhage in 2017, age 72).    She has not had a full-blown syncopal event since March 2023.  It has been a couple of years since she has had a detectable episode of atrial fibrillation.  She has always been arrhythmia unaware, she did not have palpitations when her event monitor demonstrated atrial fibrillation.  She has not had any serious bleeding problems.  She denies chest pain or shortness of breath at rest or with activity and does not have edema, orthopnea, PND, claudication or with new focal neurological complaints.  She is quite bradycardic today with a heart rate of 49 bpm but denies any symptoms of dizziness or fatigue.  Her blood pressure is very well-controlled.  Her most recent lipid profile in February 2024 showed LDL of 60, HDL 52 and slightly elevated triglycerides 241.  Her kidney function in October 2024 was abnormal with a creatinine of 1.67, but this is been her baseline for at least the last 4 years or so (GFR 30-35).  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  Her father had frequent syncope throughout his life as well.  She also has hypertension, hyperlipidemia, echocardiogram shows left ventricular hypertrophy with grade 1 diastolic dysfunction moderate  to severely dilated left atrium, 2+ mitral regurgitation.  She had a normal stress test in 2014.   Past Medical History:  Diagnosis Date   Arthritis    Atrial fibrillation (HCC)    Chest pain    a. 2014: NST with no evidence of ischemia   CKD (chronic kidney disease) stage 3, GFR 30-59 ml/min (HCC)    Diastolic dysfunction    grade 2 with dyspnea on exertion   H/O echocardiogram    a. 2014: echo showing EF of 65-70% with Grade 1 DD, moderate TR, and mild to moderate MR   Hyperlipidemia    Hypertension    Near syncope    Osteopenia     Past Surgical History:  Procedure Laterality Date   ABDOMINAL HYSTERECTOMY     TAH/BSO (menorrhagia)   bunion repair     HERNIA REPAIR      Current Medications: Outpatient Medications Prior to Visit  Medication Sig Dispense Refill   amLODipine (NORVASC) 10 MG tablet TAKE 1 TABLET BY MOUTH DAILY 100 tablet 0   Cholecalciferol (VITAMIN D3 PO) Take 165 mcg by mouth.     diclofenac Sodium (VOLTAREN) 1 % GEL Apply 2 g topically 4 (four) times daily. 50 g 0   ELIQUIS 5 MG TABS tablet TAKE 1 TABLET BY MOUTH TWICE  DAILY 200 tablet 2   lidocaine (XYLOCAINE) 5 % ointment Apply 1 Application topically as needed. 35.44 g 0   losartan (COZAAR) 100 MG tablet TAKE 1 TABLET  BY MOUTH EVERY DAY 90 tablet 1   Multiple Vitamins-Minerals (CENTRUM SILVER 50+WOMEN) TABS Take 1 tablet by mouth daily with breakfast.     neomycin-polymyxin-hydrocortisone (CORTISPORIN) OTIC solution Place 3 drops into the right ear 4 (four) times daily. 10 mL 0   Omega-3 Fatty Acids (OMEGA 3 500 PO) Take by mouth.     rosuvastatin (CRESTOR) 10 MG tablet TAKE 1 TABLET BY MOUTH ONCE  DAILY 100 tablet 1   terbinafine (LAMISIL) 250 MG tablet Take 1 tablet (250 mg total) by mouth daily. 30 tablet 2   vitamin B-12 (CYANOCOBALAMIN) 100 MCG tablet Take 100 mcg by mouth daily.     metoprolol tartrate (LOPRESSOR) 25 MG tablet Take 1 tablet (25 mg total) by mouth 2 (two) times daily. 180 tablet 3    No facility-administered medications prior to visit.     Allergies:   Codeine and Codeine   Social History   Socioeconomic History   Marital status: Divorced    Spouse name: Not on file   Number of children: 5   Years of education: 12th grade   Highest education level: Not on file  Occupational History   Occupation: Retired  Tobacco Use   Smoking status: Never   Smokeless tobacco: Never  Vaping Use   Vaping status: Never Used  Substance and Sexual Activity   Alcohol use: Yes    Comment: occasionally   Drug use: No   Sexual activity: Yes  Other Topics Concern   Not on file  Social History Narrative   ** Merged History Encounter **       Lives with her son. Right-handed. Four living children. Occasional caffeine use.   Social Drivers of Corporate investment banker Strain: Low Risk  (03/22/2023)   Overall Financial Resource Strain (CARDIA)    Difficulty of Paying Living Expenses: Not hard at all  Food Insecurity: No Food Insecurity (03/22/2023)   Hunger Vital Sign    Worried About Running Out of Food in the Last Year: Never true    Ran Out of Food in the Last Year: Never true  Transportation Needs: No Transportation Needs (03/22/2023)   PRAPARE - Administrator, Civil Service (Medical): No    Lack of Transportation (Non-Medical): No  Physical Activity: Insufficiently Active (03/22/2023)   Exercise Vital Sign    Days of Exercise per Week: 3 days    Minutes of Exercise per Session: 30 min  Stress: No Stress Concern Present (03/22/2023)   [employer] of Occupational Health - Occupational Stress Questionnaire    Feeling of Stress : Not at all  Social Connections: Moderately Integrated (03/22/2023)   Social Connection and Isolation Panel [NHANES]    Frequency of Communication with Friends and Family: More than three times a week    Frequency of Social Gatherings with Friends and Family: Three times a week    Attends Religious Services: More than 4  times per year    Active Member of Clubs or Organizations: Yes    Attends Engineer, structural: More than 4 times per year    Marital Status: Divorced     Family History:  The patient's family history includes Breast cancer in her daughter; Diabetes in her sister and sister; Heart disease in her father; Hypertension in her daughter, daughter, father, sister, and sister; Kidney failure in her mother.   ROS:   Please see the history of present illness.    ROS All other systems are reviewed and  are negative.   PHYSICAL EXAM:   VS:  BP 118/62   Pulse (!) 49   Ht 5\' 5"  (1.651 m)   Wt 151 lb (68.5 kg)   SpO2 98%   BMI 25.13 kg/m      General: Alert, oriented x3, no distress, appears fit Head: no evidence of trauma, PERRL, EOMI, no exophtalmos or lid lag, no myxedema, no xanthelasma; normal ears, nose and oropharynx Neck: normal jugular venous pulsations and no hepatojugular reflux; brisk carotid pulses without delay and no carotid bruits Chest: clear to auscultation, no signs of consolidation by percussion or palpation, normal fremitus, symmetrical and full respiratory excursions Cardiovascular: normal position and quality of the apical impulse, regular rhythm, normal first and second heart sounds, 3/6 apical holosystolic murmur, no diastolic murmurs, rubs or gallops Abdomen: no tenderness or distention, no masses by palpation, no abnormal pulsatility or arterial bruits, normal bowel sounds, no hepatosplenomegaly Extremities: no clubbing, cyanosis or edema; 2+ radial, ulnar and brachial pulses bilaterally; 2+ right femoral, posterior tibial and dorsalis pedis pulses; 2+ left femoral, posterior tibial and dorsalis pedis pulses; no subclavian or femoral bruits Neurological: grossly nonfocal Psych: Normal mood and affect    Wt Readings from Last 3 Encounters:  10/25/23 151 lb (68.5 kg)  10/22/23 151 lb (68.5 kg)  08/10/23 154 lb (69.9 kg)      Studies/Labs Reviewed:   EKG:  Personally reviewed the ECG tracing from 08/10/2023 which shows mild sinus bradycardia and diffuse nonspecific T wave flattening.  Voltages are relatively low.  EKG Interpretation Date/Time:    Ventricular Rate:    PR Interval:    QRS Duration:    QT Interval:    QTC Calculation:   R Axis:      Text Interpretation:           Recent Labs: 08/17/2023: ALT 10; BUN 19; Creat 1.67; Hemoglobin 10.2; Platelets 187; Potassium 4.2; Sodium 140   Lipid Panel    Component Value Date/Time   CHOL 142 12/18/2022 0813   CHOL 140 07/14/2020 0939   CHOL 191 07/10/2013 0832   TRIG 241 (H) 12/18/2022 0813   TRIG 210 (H) 07/10/2013 0832   HDL 52 12/18/2022 0813   HDL 48 07/14/2020 0939   HDL 52 07/10/2013 0832   CHOLHDL 2.7 12/18/2022 0813   VLDL 50 (H) 05/07/2014 0852   LDLCALC 60 12/18/2022 0813   LDLCALC 97 07/10/2013 0832    Additional studies/ records that were reviewed today include:    ASSESSMENT:    1. Vasovagal syncope   2. Paroxysmal atrial fibrillation (HCC)   3. Essential hypertension   4. Nonrheumatic mitral (valve) insufficiency   5. Murmur   6. Acquired thrombophilia (HCC)   7. Stage 3b chronic kidney disease (HCC)       PLAN:  In order of problems listed above:  Syncope: Very infrequent events, none in about 18 months.  Avoid high doses of diuretics. AFib: Has never had palpitations.  The arrhythmia was discovered incidentally on event monitor, well rate controlled on metoprolol. CHADSVasc 4 (age, gender, HTN) .  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  Antiarrhythmic therapy is not indicated since she is completely asymptomatic. HTN: excellent control.  She is rather bradycardic.  Will reduce the metoprolol to just 12.5 mg twice daily. Mild/moderate MR:  murmur is much louder than I recall.  Strikingly low voltage on ECG recently, despite the fact  the previous  echo described LVH.  Repeat the echo to see if it is worth doing an evaluation for amyloidosis and to also reevaluate the mitral regurgitation. Anticoagulation: No history of stroke or TIA.  No bleeding complications.  When she turns 82 or if he loses a more weight she will qualify for the lower dose of apixaban HLP: Triglycerides did not justify additional medications.  Excellent LDL and HDL cholesterol statin, mild CKD: Labile for the last roughly 4 years with a GFR of 30-35.     Medication Adjustments/Labs and Tests Ordered: Current medicines are reviewed at length with the patient today.  Concerns regarding medicines are outlined above.  Medication changes, Labs and Tests ordered today are listed in the Patient Instructions below. Patient Instructions  Medication Instructions:  Decrease Metoprolol Tartrate to 12.5 mg twice a day *If you need a refill on your cardiac medications before your next appointment, please call your pharmacy*  Testing/Procedures: Your physician has requested that you have an echocardiogram. Echocardiography is a painless test that uses sound waves to create images of your heart. It provides your doctor with information about the size and shape of your heart and how well your heart's chambers and valves are working. This procedure takes approximately one hour. There are no restrictions for this procedure. Please do NOT wear cologne, perfume, aftershave, or lotions (deodorant is allowed). Please arrive 15 minutes prior to your appointment time.  Please note: We ask at that you not bring children with you during ultrasound (echo/ vascular) testing. Due to room size and safety concerns, children are not allowed in the ultrasound rooms during exams. Our front office staff cannot provide observation of children in our lobby area while testing is being conducted. An adult accompanying a patient to their appointment will only be allowed in the ultrasound room at the  discretion of the ultrasound technician under special circumstances. We apologize for any inconvenience.    Follow-Up: At [facility], you and your health needs are our priority.  As part of our continuing mission to provide you with exceptional heart care, we have created designated Provider Care Teams.  These Care Teams include your primary Cardiologist (physician) and Advanced Practice Providers (APPs -  Physician Assistants and Nurse Practitioners) who all work together to provide you with the care you need, when you need it.  We recommend signing up for the patient portal called "MyChart".  Sign up information is provided on this After Visit Summary.  MyChart is used to connect with patients for Virtual Visits (Telemedicine).  Patients are able to view lab/test results, encounter notes, upcoming appointments, etc.  Non-urgent messages can be sent to your provider as well.   To learn more about what you can do with MyChart, go to [email or web address].    Your next appointment:   1 year(s)  Provider:   Thurmon Fair, MD        Signed, Thurmon Fair, MD  10/27/2023 4:48 PM    [facility] [address] [place], [place], [place]  [address] Phone: [phone]; Fax: [phone]

## 2023-10-22 NOTE — Patient Instructions (Addendum)
Medication Instructions:  Decrease Metoprolol Tartrate to 12.5 mg twice a day *If you need a refill on your cardiac medications before your next appointment, please call your pharmacy*  Testing/Procedures: Your physician has requested that you have an echocardiogram. Echocardiography is a painless test that uses sound waves to create images of your heart. It provides your doctor with information about the size and shape of your heart and how well your heart's chambers and valves are working. This procedure takes approximately one hour. There are no restrictions for this procedure. Please do NOT wear cologne, perfume, aftershave, or lotions (deodorant is allowed). Please arrive 15 minutes prior to your appointment time.  Please note: We ask at that you not bring children with you during ultrasound (echo/ vascular) testing. Due to room size and safety concerns, children are not allowed in the ultrasound rooms during exams. Our front office staff cannot provide observation of children in our lobby area while testing is being conducted. An adult accompanying a patient to their appointment will only be allowed in the ultrasound room at the discretion of the ultrasound technician under special circumstances. We apologize for any inconvenience.    Follow-Up: At [facility], you and your health needs are our priority.  As part of our continuing mission to provide you with exceptional heart care, we have created designated Provider Care Teams.  These Care Teams include your primary Cardiologist (physician) and Advanced Practice Providers (APPs -  Physician Assistants and Nurse Practitioners) who all work together to provide you with the care you need, when you need it.  We recommend signing up for the patient portal called "MyChart".  Sign up information is provided on this After Visit Summary.  MyChart is used to connect with patients for Virtual Visits (Telemedicine).  Patients are able to view  lab/test results, encounter notes, upcoming appointments, etc.  Non-urgent messages can be sent to your provider as well.   To learn more about what you can do with MyChart, go to [email or web address].    Your next appointment:   1 year(s)  Provider:   Thurmon Fair, MD

## 2023-10-25 ENCOUNTER — Ambulatory Visit (INDEPENDENT_AMBULATORY_CARE_PROVIDER_SITE_OTHER): Payer: 59 | Admitting: Family Medicine

## 2023-10-25 ENCOUNTER — Encounter: Payer: Self-pay | Admitting: Family Medicine

## 2023-10-25 VITALS — BP 120/74 | HR 51 | Temp 97.7°F | Ht 65.0 in | Wt 151.0 lb

## 2023-10-25 DIAGNOSIS — M25561 Pain in right knee: Secondary | ICD-10-CM | POA: Diagnosis not present

## 2023-10-25 MED ORDER — TRIAMCINOLONE ACETONIDE 40 MG/ML IJ SUSP
80.0000 mg | Freq: Once | INTRAMUSCULAR | Status: AC
  Administered 2023-10-25: 80 mg via INTRA_ARTICULAR

## 2023-10-25 NOTE — Progress Notes (Addendum)
Subjective:  HPI: Christine Hogan is a 77 y.o. female presenting on 10/25/2023 for Acute Visit (r knee pain, starting to become unbearable)   HPI Patient is in today for right knee pain and stiffness that is worsening over the last 3 days. The pain is suprapatellar and posterior lateral. She has arthritis in her knees, no injury, trauma, or fall.  Has received steroid injections in the past. She has not been treated for this in some time and is only taking Tylenol for the pain.  Review of Systems  All other systems reviewed and are negative.  Joint Injection/Arthrocentesis  Date/Time: 10/25/2023 9:23 AM  Performed by: Park Meo, FNP Authorized by: Park Meo, FNP  Indications: pain  Body area: knee Joint: right knee Local anesthesia used: yes  Anesthesia: Local anesthesia used: yes Local Anesthetic: topical anesthetic  Sedation: Patient sedated: no  Preparation: Patient was prepped and draped in the usual sterile fashion. Needle size: 22 G Ultrasound guidance: no Approach: lateral Aspirate: clear Aspirate amount: 1 mL Betamethasone amount: 80 mg Bupivacaine 0.25% amount: 2 mL Lidocaine 1% amount: 2 mL Patient tolerance: patient tolerated the procedure well with no immediate complications      Relevant past medical history reviewed and updated as indicated.   Past Medical History:  Diagnosis Date   Arthritis    Atrial fibrillation (HCC)    Chest pain    a. 2014: NST with no evidence of ischemia   CKD (chronic kidney disease) stage 3, GFR 30-59 ml/min (HCC)    Diastolic dysfunction    grade 2 with dyspnea on exertion   H/O echocardiogram    a. 2014: echo showing EF of 65-70% with Grade 1 DD, moderate TR, and mild to moderate MR   Hyperlipidemia    Hypertension    Near syncope    Osteopenia      Past Surgical History:  Procedure Laterality Date   ABDOMINAL HYSTERECTOMY     TAH/BSO (menorrhagia)   bunion repair     HERNIA REPAIR       Allergies and medications reviewed and updated.   Current Outpatient Medications:    amLODipine (NORVASC) 10 MG tablet, TAKE 1 TABLET BY MOUTH DAILY, Disp: 100 tablet, Rfl: 0   Cholecalciferol (VITAMIN D3 PO), Take 165 mcg by mouth., Disp: , Rfl:    diclofenac Sodium (VOLTAREN) 1 % GEL, Apply 2 g topically 4 (four) times daily., Disp: 50 g, Rfl: 0   ELIQUIS 5 MG TABS tablet, TAKE 1 TABLET BY MOUTH TWICE  DAILY, Disp: 200 tablet, Rfl: 2   lidocaine (XYLOCAINE) 5 % ointment, Apply 1 Application topically as needed., Disp: 35.44 g, Rfl: 0   losartan (COZAAR) 100 MG tablet, TAKE 1 TABLET BY MOUTH EVERY DAY, Disp: 90 tablet, Rfl: 1   metoprolol tartrate (LOPRESSOR) 25 MG tablet, Take 0.5 tablets (12.5 mg total) by mouth 2 (two) times daily., Disp: 90 tablet, Rfl: 3   Multiple Vitamins-Minerals (CENTRUM SILVER 50+WOMEN) TABS, Take 1 tablet by mouth daily with breakfast., Disp: , Rfl:    neomycin-polymyxin-hydrocortisone (CORTISPORIN) OTIC solution, Place 3 drops into the right ear 4 (four) times daily., Disp: 10 mL, Rfl: 0   Omega-3 Fatty Acids (OMEGA 3 500 PO), Take by mouth., Disp: , Rfl:    rosuvastatin (CRESTOR) 10 MG tablet, TAKE 1 TABLET BY MOUTH ONCE  DAILY, Disp: 100 tablet, Rfl: 1   terbinafine (LAMISIL) 250 MG tablet, Take 1 tablet (250 mg total) by mouth daily., Disp: 30  tablet, Rfl: 2   vitamin B-12 (CYANOCOBALAMIN) 100 MCG tablet, Take 100 mcg by mouth daily., Disp: , Rfl:   Allergies  Allergen Reactions   Codeine Nausea And Vomiting   Codeine Nausea And Vomiting and Other (See Comments)    Dizziness, also    Objective:   BP 120/74   Pulse (!) 51   Temp 97.7 F (36.5 C) (Oral)   Ht 5\' 5"  (1.651 m)   Wt 151 lb (68.5 kg)   SpO2 95%   BMI 25.13 kg/m      10/25/2023    8:59 AM 10/22/2023    2:57 PM 08/10/2023    9:08 AM  Vitals with BMI  Height 5\' 5"  5\' 5"  5' 0.5"  Weight 151 lbs 151 lbs 154 lbs  BMI 25.13 25.13 29.57  Systolic 120 118 629  Diastolic 74 62 74   Pulse 51 49 46     Physical Exam Vitals and nursing note reviewed.  Constitutional:      Appearance: Normal appearance. She is normal weight.  HENT:     Head: Normocephalic and atraumatic.  Musculoskeletal:     Right knee: No effusion or erythema. Decreased range of motion. Tenderness present.  Skin:    General: Skin is warm and dry.  Neurological:     General: No focal deficit present.     Mental Status: She is alert and oriented to person, place, and time. Mental status is at baseline.  Psychiatric:        Mood and Affect: Mood normal.        Behavior: Behavior normal.        Thought Content: Thought content normal.        Judgment: Judgment normal.     Assessment & Plan:  Acute pain of right knee Assessment & Plan: Right knee is painful and stiff. Known history of arthritis in this knee. Has not required intervention in almost 2 years. Would like joint injection today for pain relief. No redness or warmth. She endorses some mild swelling but knees appear equal bilaterally, minimal swelling on exam. Right lateral knee joint injection performed using sterile technique without complications. Return to office if symptoms persist or worsen   Other orders -     Arthrocentesis     Follow up plan: Return if symptoms worsen or fail to improve.  Park Meo, FNP

## 2023-10-25 NOTE — Addendum Note (Signed)
Addended by: Arta Silence on: 10/25/2023 02:25 PM   Modules accepted: Orders

## 2023-10-25 NOTE — Assessment & Plan Note (Addendum)
Right knee is painful and stiff. Known history of arthritis in this knee. Has not required intervention in almost 2 years. Would like joint injection today for pain relief. No redness or warmth. She endorses some mild swelling but knees appear equal bilaterally, minimal swelling on exam. Right lateral knee joint injection performed using sterile technique without complications. Return to office if symptoms persist or worsen

## 2023-10-27 ENCOUNTER — Encounter: Payer: Self-pay | Admitting: Cardiovascular Disease

## 2023-11-06 ENCOUNTER — Other Ambulatory Visit: Payer: Self-pay | Admitting: Family Medicine

## 2023-11-08 ENCOUNTER — Emergency Department (HOSPITAL_COMMUNITY): Payer: 59

## 2023-11-08 ENCOUNTER — Observation Stay (HOSPITAL_COMMUNITY)
Admission: EM | Admit: 2023-11-08 | Discharge: 2023-11-09 | Disposition: A | Discharge status: Home / Self Care | Payer: 59 | Attending: Internal Medicine | Admitting: Internal Medicine

## 2023-11-08 ENCOUNTER — Other Ambulatory Visit: Payer: Self-pay

## 2023-11-08 ENCOUNTER — Encounter (HOSPITAL_COMMUNITY): Payer: Self-pay

## 2023-11-08 ENCOUNTER — Observation Stay (HOSPITAL_COMMUNITY): Payer: 59

## 2023-11-08 DIAGNOSIS — I4430 Unspecified atrioventricular block: Secondary | ICD-10-CM | POA: Diagnosis not present

## 2023-11-08 DIAGNOSIS — N1832 Chronic kidney disease, stage 3b: Secondary | ICD-10-CM

## 2023-11-08 DIAGNOSIS — R55 Syncope and collapse: Principal | ICD-10-CM

## 2023-11-08 DIAGNOSIS — I5032 Chronic diastolic (congestive) heart failure: Secondary | ICD-10-CM

## 2023-11-08 DIAGNOSIS — M6281 Muscle weakness (generalized): Secondary | ICD-10-CM | POA: Insufficient documentation

## 2023-11-08 DIAGNOSIS — F1010 Alcohol abuse, uncomplicated: Secondary | ICD-10-CM | POA: Diagnosis present

## 2023-11-08 DIAGNOSIS — R464 Slowness and poor responsiveness: Secondary | ICD-10-CM

## 2023-11-08 DIAGNOSIS — E782 Mixed hyperlipidemia: Secondary | ICD-10-CM | POA: Diagnosis not present

## 2023-11-08 DIAGNOSIS — Z7901 Long term (current) use of anticoagulants: Secondary | ICD-10-CM | POA: Insufficient documentation

## 2023-11-08 DIAGNOSIS — Z79899 Other long term (current) drug therapy: Secondary | ICD-10-CM | POA: Insufficient documentation

## 2023-11-08 DIAGNOSIS — I10 Essential (primary) hypertension: Secondary | ICD-10-CM | POA: Diagnosis not present

## 2023-11-08 DIAGNOSIS — I517 Cardiomegaly: Secondary | ICD-10-CM | POA: Diagnosis not present

## 2023-11-08 DIAGNOSIS — N183 Chronic kidney disease, stage 3 unspecified: Secondary | ICD-10-CM | POA: Diagnosis present

## 2023-11-08 DIAGNOSIS — I700 Atherosclerosis of aorta: Secondary | ICD-10-CM | POA: Diagnosis not present

## 2023-11-08 DIAGNOSIS — R531 Weakness: Secondary | ICD-10-CM | POA: Diagnosis not present

## 2023-11-08 DIAGNOSIS — I480 Paroxysmal atrial fibrillation: Secondary | ICD-10-CM

## 2023-11-08 DIAGNOSIS — K5900 Constipation, unspecified: Secondary | ICD-10-CM | POA: Diagnosis not present

## 2023-11-08 DIAGNOSIS — R29818 Other symptoms and signs involving the nervous system: Secondary | ICD-10-CM | POA: Diagnosis not present

## 2023-11-08 DIAGNOSIS — I6523 Occlusion and stenosis of bilateral carotid arteries: Secondary | ICD-10-CM | POA: Diagnosis not present

## 2023-11-08 DIAGNOSIS — I130 Hypertensive heart and chronic kidney disease with heart failure and stage 1 through stage 4 chronic kidney disease, or unspecified chronic kidney disease: Secondary | ICD-10-CM | POA: Diagnosis not present

## 2023-11-08 DIAGNOSIS — G459 Transient cerebral ischemic attack, unspecified: Secondary | ICD-10-CM | POA: Diagnosis not present

## 2023-11-08 LAB — URINALYSIS, ROUTINE W REFLEX MICROSCOPIC
Bilirubin Urine: NEGATIVE
Glucose, UA: NEGATIVE mg/dL
Ketones, ur: NEGATIVE mg/dL
Leukocytes,Ua: NEGATIVE
Nitrite: NEGATIVE
Protein, ur: 100 mg/dL — AB
Specific Gravity, Urine: 1.024 (ref 1.005–1.030)
pH: 5 (ref 5.0–8.0)

## 2023-11-08 LAB — COMPREHENSIVE METABOLIC PANEL
ALT: 27 U/L (ref 0–44)
AST: 30 U/L (ref 15–41)
Albumin: 4.1 g/dL (ref 3.5–5.0)
Alkaline Phosphatase: 63 U/L (ref 38–126)
Anion gap: 17 — ABNORMAL HIGH (ref 5–15)
BUN: 25 mg/dL — ABNORMAL HIGH (ref 8–23)
CO2: 19 mmol/L — ABNORMAL LOW (ref 22–32)
Calcium: 9.5 mg/dL (ref 8.9–10.3)
Chloride: 108 mmol/L (ref 98–111)
Creatinine, Ser: 1.79 mg/dL — ABNORMAL HIGH (ref 0.44–1.00)
GFR, Estimated: 29 mL/min — ABNORMAL LOW (ref >60)
Glucose, Bld: 118 mg/dL — ABNORMAL HIGH (ref 70–99)
Potassium: 3.5 mmol/L (ref 3.5–5.1)
Sodium: 144 mmol/L (ref 135–145)
Total Bilirubin: 0.6 mg/dL (ref 0.0–1.2)
Total Protein: 7.2 g/dL (ref 6.5–8.1)

## 2023-11-08 LAB — DIFFERENTIAL
Abs Immature Granulocytes: 0.02 10*3/uL (ref 0.00–0.07)
Basophils Absolute: 0 10*3/uL (ref 0.0–0.1)
Basophils Relative: 0 %
Eosinophils Absolute: 0.1 10*3/uL (ref 0.0–0.5)
Eosinophils Relative: 1 %
Immature Granulocytes: 0 %
Lymphocytes Relative: 65 %
Lymphs Abs: 4.1 10*3/uL — ABNORMAL HIGH (ref 0.7–4.0)
Monocytes Absolute: 0.6 10*3/uL (ref 0.1–1.0)
Monocytes Relative: 10 %
Neutro Abs: 1.6 10*3/uL — ABNORMAL LOW (ref 1.7–7.7)
Neutrophils Relative %: 24 %

## 2023-11-08 LAB — CBC
HCT: 38.9 % (ref 36.0–46.0)
Hemoglobin: 12.1 g/dL (ref 12.0–15.0)
MCH: 28.3 pg (ref 26.0–34.0)
MCHC: 31.1 g/dL (ref 30.0–36.0)
MCV: 91.1 fL (ref 80.0–100.0)
Platelets: 166 10*3/uL (ref 150–400)
RBC: 4.27 MIL/uL (ref 3.87–5.11)
RDW: 15.8 % — ABNORMAL HIGH (ref 11.5–15.5)
WBC: 6.4 10*3/uL (ref 4.0–10.5)
nRBC: 0 % (ref 0.0–0.2)

## 2023-11-08 LAB — I-STAT CHEM 8, ED
BUN: 27 mg/dL — ABNORMAL HIGH (ref 8–23)
Calcium, Ion: 1.14 mmol/L — ABNORMAL LOW (ref 1.15–1.40)
Chloride: 115 mmol/L — ABNORMAL HIGH (ref 98–111)
Creatinine, Ser: 2.1 mg/dL — ABNORMAL HIGH (ref 0.44–1.00)
Glucose, Bld: 116 mg/dL — ABNORMAL HIGH (ref 70–99)
HCT: 40 % (ref 36.0–46.0)
Hemoglobin: 13.6 g/dL (ref 12.0–15.0)
Potassium: 3.5 mmol/L (ref 3.5–5.1)
Sodium: 148 mmol/L — ABNORMAL HIGH (ref 135–145)
TCO2: 21 mmol/L — ABNORMAL LOW (ref 22–32)

## 2023-11-08 LAB — TROPONIN I (HIGH SENSITIVITY)
Troponin I (High Sensitivity): 10 ng/L (ref <18)
Troponin I (High Sensitivity): 10 ng/L (ref <18)

## 2023-11-08 LAB — BASIC METABOLIC PANEL
Anion gap: 10 (ref 5–15)
BUN: 21 mg/dL (ref 8–23)
CO2: 20 mmol/L — ABNORMAL LOW (ref 22–32)
Calcium: 8.7 mg/dL — ABNORMAL LOW (ref 8.9–10.3)
Chloride: 116 mmol/L — ABNORMAL HIGH (ref 98–111)
Creatinine, Ser: 1.4 mg/dL — ABNORMAL HIGH (ref 0.44–1.00)
GFR, Estimated: 39 mL/min — ABNORMAL LOW (ref >60)
Glucose, Bld: 85 mg/dL (ref 70–99)
Potassium: 3.5 mmol/L (ref 3.5–5.1)
Sodium: 146 mmol/L — ABNORMAL HIGH (ref 135–145)

## 2023-11-08 LAB — APTT: aPTT: 30 s (ref 24–36)

## 2023-11-08 LAB — CK: Total CK: 72 U/L (ref 38–234)

## 2023-11-08 LAB — RAPID URINE DRUG SCREEN, HOSP PERFORMED
Amphetamines: NOT DETECTED
Barbiturates: NOT DETECTED
Benzodiazepines: NOT DETECTED
Cocaine: NOT DETECTED
Opiates: NOT DETECTED
Tetrahydrocannabinol: NOT DETECTED

## 2023-11-08 LAB — PROTIME-INR
INR: 1.1 (ref 0.8–1.2)
Prothrombin Time: 14.5 s (ref 11.4–15.2)

## 2023-11-08 LAB — TSH: TSH: 0.472 u[IU]/mL (ref 0.350–4.500)

## 2023-11-08 LAB — CBG MONITORING, ED: Glucose-Capillary: 112 mg/dL — ABNORMAL HIGH (ref 70–99)

## 2023-11-08 LAB — ETHANOL: Alcohol, Ethyl (B): 271 mg/dL — ABNORMAL HIGH (ref <10)

## 2023-11-08 LAB — MAGNESIUM: Magnesium: 1.6 mg/dL — ABNORMAL LOW (ref 1.7–2.4)

## 2023-11-08 LAB — PHOSPHORUS: Phosphorus: 2.9 mg/dL (ref 2.5–4.6)

## 2023-11-08 MED ORDER — SODIUM CHLORIDE 0.9 % IV SOLN: INTRAVENOUS | Status: DC

## 2023-11-08 MED ORDER — ONDANSETRON HCL 4 MG PO TABS
4.0000 mg | ORAL_TABLET | Freq: Four times a day (QID) | ORAL | Status: DC | PRN
  Administered 2023-11-09: 4 mg via ORAL
  Filled 2023-11-08: qty 1

## 2023-11-08 MED ORDER — METOPROLOL TARTRATE 25 MG PO TABS
12.5000 mg | ORAL_TABLET | Freq: Two times a day (BID) | ORAL | Status: DC
  Administered 2023-11-08 – 2023-11-09 (×2): 12.5 mg via ORAL
  Filled 2023-11-08 (×2): qty 1

## 2023-11-08 MED ORDER — MAGNESIUM SULFATE 2 GM/50ML IV SOLN
2.0000 g | Freq: Once | INTRAVENOUS | Status: AC
  Administered 2023-11-08: 2 g via INTRAVENOUS
  Filled 2023-11-08: qty 50

## 2023-11-08 MED ORDER — ROSUVASTATIN CALCIUM 5 MG PO TABS
10.0000 mg | ORAL_TABLET | Freq: Every day | ORAL | Status: DC
  Administered 2023-11-08 – 2023-11-09 (×2): 10 mg via ORAL
  Filled 2023-11-08 (×2): qty 2

## 2023-11-08 MED ORDER — SODIUM CHLORIDE 0.9 % IV BOLUS
1000.0000 mL | Freq: Once | INTRAVENOUS | Status: AC
  Administered 2023-11-08: 1000 mL via INTRAVENOUS

## 2023-11-08 MED ORDER — ACETAMINOPHEN 325 MG PO TABS: 650.0000 mg | ORAL_TABLET | Freq: Four times a day (QID) | ORAL | Status: DC | PRN

## 2023-11-08 MED ORDER — IOHEXOL 350 MG/ML SOLN
75.0000 mL | Freq: Once | INTRAVENOUS | Status: AC | PRN
  Administered 2023-11-08: 75 mL via INTRAVENOUS

## 2023-11-08 MED ORDER — APIXABAN 5 MG PO TABS
5.0000 mg | ORAL_TABLET | Freq: Two times a day (BID) | ORAL | Status: DC
  Administered 2023-11-08 – 2023-11-09 (×2): 5 mg via ORAL
  Filled 2023-11-08 (×2): qty 1

## 2023-11-08 MED ORDER — POTASSIUM CHLORIDE CRYS ER 20 MEQ PO TBCR
20.0000 meq | EXTENDED_RELEASE_TABLET | Freq: Once | ORAL | Status: AC
  Administered 2023-11-08: 20 meq via ORAL
  Filled 2023-11-08: qty 1

## 2023-11-08 MED ORDER — HYDROCODONE-ACETAMINOPHEN 5-325 MG PO TABS: 1.0000 | ORAL_TABLET | ORAL | Status: DC | PRN

## 2023-11-08 MED ORDER — ONDANSETRON HCL 4 MG/2ML IJ SOLN: 4.0000 mg | Freq: Four times a day (QID) | INTRAMUSCULAR | Status: DC | PRN

## 2023-11-08 MED ORDER — ACETAMINOPHEN 650 MG RE SUPP: 650.0000 mg | Freq: Four times a day (QID) | RECTAL | Status: DC | PRN

## 2023-11-08 NOTE — Assessment & Plan Note (Signed)
-  chronic avoid nephrotoxic medications such as NSAIDs, Vanco Zosyn combo,  avoid hypotension, continue to follow renal function

## 2023-11-08 NOTE — Progress Notes (Signed)
 EEG complete - results pending

## 2023-11-08 NOTE — H&P (Signed)
 [name] FMW:[id number] DOB: [birth year] DOA: 11/08/2023     PCP: Duanne Hogan DASEN, MD   Outpatient Specialists:  CARDS:  Dr. Jerel Balding, MD    NEurology  cannot remember who it was     Patient arrived to ER on 11/08/23 at 1532 Referred by Attending Christine Ales, MD   Patient coming from:    home Lives   With family    Chief Complaint:   Chief Complaint  Patient presents with   Code Stroke    HPI: Christine Hogan is a 78 y.o. female with medical history significant of CKD3b, Vasovagal syncope, A.fib on eliquis , HTN, HLD    Presented with  syncope Pt was with family had a few drinks, she has arthritis and her right knee gave out from under her that happens a lot.  She had a sense of doom, she could hear her grandson talking to her but was too weak to answer  Per EMS and family she was a bit weak for a while all over. The whole episode lasted a few min. NO CP , no SOB She was not in any pain , but right before this happened she was straining trying to stand up and she could not Was brought in as code stroke  Seen by neurology  She had 3 shot of liquor today but reports not every day When she drinks she drinks heavy but she does not drink every day She drinks heavy about twice  a week She does not think this episode was related to drinking She does have syncopal episodes, she says her dead did too But non of them as scary as this one today She has been seen by cardiology    Reports she have had work up in the past for seizures and syncope She feels the spell she gets seem like seizures     significant ETOH intake   Does not smoke   Lab Results  Component Value Date   SARSCOV2NAA NEGATIVE 01/13/2022   SARSCOV2NAA Not Detected 08/25/2020        Regarding pertinent Chronic problems:     Hyperlipidemia - on statins Crestor  Lipid Panel     Component Value Date/Time   CHOL 142 12/18/2022 0813   CHOL 140 07/14/2020 0939   CHOL 191 07/10/2013 0832    TRIG 241 (H) 12/18/2022 0813   TRIG 210 (H) 07/10/2013 0832   HDL 52 12/18/2022 0813   HDL 48 07/14/2020 0939   HDL 52 07/10/2013 0832   CHOLHDL 2.7 12/18/2022 0813   VLDL 50 (H) 05/07/2014 0852   LDLCALC 60 12/18/2022 0813   LDLCALC 97 07/10/2013 0832   LABVLDL 38 07/14/2020 0939     HTN on Norvasc , Cozaar , metoprolol    chronic CHF diastolic  -  Per cardiology note echo showed diastolic dysfunction grade 1 moderate to severely dilated left atrium 2+ mitral regurgitation  Normal stress test 2014       A. Fib -   atrial fibrillation CHA2DS2 vas score   4    current  on anticoagulation with   Eliquis ,       -  Rate control:  Currently controlled with  Toprolol,         CKD stage IIIb   baseline Cr 1.7 Estimated Creatinine Clearance: 22.9 mL/min (A) (by C-G formula based on SCr of 2.1 mg/dL (H)).  Lab Results  Component Value Date   CREATININE 2.10 (H) 11/08/2023   CREATININE 1.79 (H)  11/08/2023   CREATININE 1.67 (H) 08/17/2023   Lab Results  Component Value Date   NA 148 (H) 11/08/2023   CL 115 (H) 11/08/2023   K 3.5 11/08/2023   CO2 19 (L) 11/08/2023   BUN 27 (H) 11/08/2023   CREATININE 2.10 (H) 11/08/2023   GFRNONAA 29 (L) 11/08/2023   CALCIUM  9.5 11/08/2023   ALBUMIN 4.1 11/08/2023   GLUCOSE 116 (H) 11/08/2023     Chronic anemia - baseline hg Hemoglobin & Hematocrit  Recent Labs    08/17/23 1119 11/08/23 1537 11/08/23 1540  HGB 10.2* 12.1 13.6   Iron/TIBC/Ferritin/ %Sat    Component Value Date/Time   IRON 81 08/17/2023 1119   TIBC 266 08/17/2023 1119   FERRITIN 208 08/17/2023 1119   IRONPCTSAT 30 08/17/2023 1119    History of vasovagal syncope since adolescence  While in ER: Clinical Course as of 11/08/23 1929  Thu Nov 08, 2023  1858 Discussed with Dr. Silvester who will admit patient for syncope.  [WS]    Clinical Course User Index [WS] Christine Elsie CROME, MD        Lab Orders         Ethanol         Protime-INR         APTT          CBC         Differential         Comprehensive metabolic panel         Urine rapid drug screen (hosp performed)         Urinalysis, Routine w reflex microscopic -Urine, Clean Catch         I-stat chem 8, ED         CBG monitoring, ED      CT HEAD   NON acute CTA head and neck  No definite emergent large vessel occlusion. 2. 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 5. Right lead patent cervical carotid and vertebral arteries    CXR - ordered    Following Medications were ordered in ER: Medications  iohexol  (OMNIPAQUE ) 350 MG/ML injection 75 mL (75 mLs Intravenous Contrast Given 11/08/23 1552)  sodium chloride  0.9 % bolus 1,000 mL (0 mLs Intravenous Stopped 11/08/23 1901)    _______________________________________________________ ER Provider Called:      Neurology  Christine Hogan They Recommend admit to medicine    SEEN in ER     ED Triage Vitals  Encounter Vitals Group     BP 11/08/23 1600 (!) 158/77     Systolic BP Percentile --      Diastolic BP Percentile --      Pulse Rate 11/08/23 1600 83     Resp 11/08/23 1600 16     Temp 11/08/23 1626 98.5 F (36.9 C)     Temp Source 11/08/23 1626 Oral     SpO2 11/08/23 1600 100 %     Weight 11/08/23 1500 157 lb 6.4 oz (71.4 kg)     Height --      Head Circumference --      Peak Flow --      Pain Score --      Pain Loc --      Pain Education --      Exclude from  Growth Chart --   UFJK(75)@     _________________________________________ Significant initial  Findings: Abnormal Labs Reviewed  ETHANOL - Abnormal; Notable for the following components:      Result Value   Alcohol, Ethyl (B) 271 (*)    All other components within normal limits  CBC - Abnormal; Notable for the following components:   RDW  15.8 (*)    All other components within normal limits  DIFFERENTIAL - Abnormal; Notable for the following components:   Neutro Abs 1.6 (*)    Lymphs Abs 4.1 (*)    All other components within normal limits  COMPREHENSIVE METABOLIC PANEL - Abnormal; Notable for the following components:   CO2 19 (*)    Glucose, Bld 118 (*)    BUN 25 (*)    Creatinine, Ser 1.79 (*)    GFR, Estimated 29 (*)    Anion gap 17 (*)    All other components within normal limits  URINALYSIS, ROUTINE W REFLEX MICROSCOPIC - Abnormal; Notable for the following components:   Color, Urine STRAW (*)    Hgb urine dipstick SMALL (*)    Protein, ur 100 (*)    Bacteria, UA RARE (*)    All other components within normal limits  I-STAT CHEM 8, ED - Abnormal; Notable for the following components:   Sodium 148 (*)    Chloride 115 (*)    BUN 27 (*)    Creatinine, Ser 2.10 (*)    Glucose, Bld 116 (*)    Calcium , Ion 1.14 (*)    TCO2 21 (*)    All other components within normal limits  CBG MONITORING, ED - Abnormal; Notable for the following components:   Glucose-Capillary 112 (*)    All other components within normal limits      _________________________ Troponin   Cardiac Panel (last 3 results) Recent Labs    11/08/23 2003  TROPONINIHS 10     ECG: Ordered Personally reviewed and interpreted by me showing: HR : 76 Rhythm: Sinus rhythm Prolonged PR interval Consider left atrial enlargement Probable anteroseptal infarct, old   QTC 474     The recent clinical data is shown below. Vitals:   11/08/23 1645 11/08/23 1700 11/08/23 1715 11/08/23 1830  BP: 120/70 126/85 (!) 146/73 139/76  Pulse: 65 71 76 81  Resp: 15 (!) 21 17 (!) 23  Temp:      TempSrc:      SpO2: 98% 100% 100% 99%  Weight:        WBC     Component Value Date/Time   WBC 6.4 11/08/2023 1537   LYMPHSABS 4.1 (H) 11/08/2023 1537   MONOABS 0.6 11/08/2023 1537   EOSABS 0.1 11/08/2023 1537   BASOSABS 0.0 11/08/2023 1537      Procalcitonin   Ordered      UA   no evidence of UTI     Urine analysis:    Component Value Date/Time   COLORURINE STRAW (A) 11/08/2023 1730   APPEARANCEUR CLEAR 11/08/2023 1730   LABSPEC 1.024 11/08/2023 1730   PHURINE 5.0 11/08/2023 1730   GLUCOSEU NEGATIVE 11/08/2023 1730   HGBUR SMALL (A) 11/08/2023 1730   BILIRUBINUR NEGATIVE 11/08/2023 1730   BILIRUBINUR neg 07/02/2012 1512   KETONESUR NEGATIVE 11/08/2023 1730   PROTEINUR 100 (A) 11/08/2023 1730   UROBILINOGEN 1.0 08/21/2019 1200   NITRITE NEGATIVE 11/08/2023 1730   LEUKOCYTESUR NEGATIVE 11/08/2023 1730     __________________________________________________________ Recent Labs  Lab 11/08/23 1537 11/08/23 1540  NA  144 148*  K 3.5 3.5  CO2 19*  --   GLUCOSE 118* 116*  BUN 25* 27*  CREATININE 1.79* 2.10*  CALCIUM  9.5  --     Cr     Up from baseline see below Lab Results  Component Value Date   CREATININE 2.10 (H) 11/08/2023   CREATININE 1.79 (H) 11/08/2023   CREATININE 1.67 (H) 08/17/2023    Recent Labs  Lab 11/08/23 1537  AST 30  ALT 27  ALKPHOS 63  BILITOT 0.6  PROT 7.2  ALBUMIN 4.1   Lab Results  Component Value Date   CALCIUM  9.5 11/08/2023    Plt: Lab Results  Component Value Date   PLT 166 11/08/2023         Recent Labs  Lab 11/08/23 1537 11/08/23 1540  WBC 6.4  --   NEUTROABS 1.6*  --   HGB 12.1 13.6  HCT 38.9 40.0  MCV 91.1  --   PLT 166  --     HG/HCT  stable,       Component Value Date/Time   HGB 13.6 11/08/2023 1540   HCT 40.0 11/08/2023 1540   MCV 91.1 11/08/2023 1537   MCV 88.2 03/16/2012 1611    Hospitalist was called for admission for   Syncope,     The following Work up has been ordered so far:  Orders Placed This Encounter  Procedures   Critical Care   CT HEAD CODE STROKE WO CONTRAST   CT ANGIO HEAD NECK W WO CM (CODE STROKE)   Ethanol   Protime-INR   APTT   CBC   Differential   Comprehensive metabolic panel   Urine rapid drug screen (hosp  performed)   Urinalysis, Routine w reflex microscopic -Urine, Clean Catch   Diet NPO time specified   Vital signs   ED Cardiac monitoring   NIH stroke score   Swallow screen   Initiate Carrier Fluid Protocol   If O2 sat If O2 Sat < 94%, administer O2 at 2 liters/minute via nasal cannula.   Cardiac Monitoring Continuous x 48 hours Indications for use: Syncope of unknown etiology   Consult to hospitalist   I-stat chem 8, ED   CBG monitoring, ED   ED EKG   EKG 12-Lead   EKG 12-Lead   ECHOCARDIOGRAM COMPLETE   EEG adult   Saline lock IV   Place in observation (patient's expected length of stay will be less than 2 midnights)     OTHER Significant initial  Findings:  labs showing:     DM  labs:  HbA1C: No results for input(s): HGBA1C in the last 8760 hours.     CBG (last 3)  Recent Labs    11/08/23 1535  GLUCAP 112*    Cultures:    Component Value Date/Time   SDES URINE, RANDOM 08/21/2019 1208   SPECREQUEST NONE 08/21/2019 1208   CULT (A) 08/21/2019 1208    <10,000 COLONIES/mL INSIGNIFICANT GROWTH Performed at [facility], [address] [place], [place] [address]    REPTSTATUS 08/22/2019 FINAL 08/21/2019 1208     Radiological Exams on Admission: CT ANGIO HEAD NECK W WO CM (CODE STROKE) Result Date: 11/08/2023 CLINICAL DATA:  Neuro deficit, acute, stroke suspected. Right-sided weakness. EXAM: CT ANGIOGRAPHY HEAD AND NECK WITH AND WITHOUT CONTRAST TECHNIQUE: Multidetector CT imaging of the head and neck was performed using the standard protocol during bolus administration of intravenous contrast. Multiplanar CT image reconstructions and MIPs were obtained to evaluate  the vascular anatomy. Carotid stenosis measurements (when applicable) are obtained utilizing NASCET criteria, using the distal internal carotid diameter as the denominator. RADIATION DOSE REDUCTION: This exam was performed according to the departmental dose-optimization program which includes  automated exposure control, adjustment of the mA and/or kV according to patient size and/or use of iterative reconstruction technique. CONTRAST:  75mL OMNIPAQUE  IOHEXOL  350 MG/ML SOLN COMPARISON:  MRA head and neck 10/21/2019. CT thoracic spine 09/12/2020. FINDINGS: CTA NECK FINDINGS Aortic arch: Standard branching with mild atherosclerotic calcification. No significant stenosis of the arch vessel origins. Right carotid system: Patent with a small amount of calcified plaque at the carotid bifurcation. No evidence of a significant stenosis or dissection. Left carotid system: Patent with a small amount mixed calcified and soft plaque at the carotid bifurcation. No evidence of a significant stenosis or dissection. Vertebral arteries: Patent without evidence of a significant stenosis or dissection. Codominant. Skeleton: Moderate cervical spondylosis. Other neck: No evidence of cervical lymphadenopathy or mass. Upper chest: 4 mm nodule in the posterior left upper lobe abutting the major fissure, unchanged from 09/12/2020 and considered benign with no follow-up imaging recommended. Review of the MIP images confirms the above findings CTA HEAD FINDINGS Anterior circulation: The internal carotid arteries are patent from skull base to carotid termini with calcified plaque resulting in a moderate to severe stenosis of the left cavernous segment. There is interval age indeterminate occlusion of the non-dominant left A1 segment with a residual opacified stump at the left A1-A2 junction. The left A2 segment is supplied by a robust anterior communicating artery, and the proximal right ACA is widely patent. Both MCAs are patent without evidence of a proximal branch occlusion. There are moderate to severe proximal right M1 and proximal right M2 stenoses. No aneurysm is identified. Posterior circulation: The intracranial vertebral arteries are widely patent to the basilar. Patent PICA, AICA, and SCA origins are visualized  bilaterally. The basilar artery is widely patent. There are small posterior communicating arteries bilaterally. Both PCAs are patent without evidence of a flow limiting proximal stenosis. There is a moderate distal left P2 stenosis. No aneurysm is identified. Venous sinuses: Patent. Anatomic variants: Duplicated right MCA. Review of the MIP images confirms the above findings These results were communicated to Dr. Vanessa at 4:07 pm on 11/08/2023 by text page via the [facility] messaging system. IMPRESSION: 1. No definite emergent large vessel occlusion. 2. 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 3. 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 4. Moderate to severe proximal right M1 and M2 stenoses. 5. Right lead patent cervical carotid and vertebral arteries. 6.  Aortic Atherosclerosis (ICD10-I70.0). Electronically Signed   By: Dasie Hamburg M.D.   On: 11/08/2023 16:24   CT HEAD CODE STROKE WO CONTRAST Result Date: 11/08/2023 CLINICAL DATA:  Code stroke. Neuro deficit, acute, stroke suspected. Right-sided weakness. EXAM: CT HEAD WITHOUT CONTRAST TECHNIQUE: Contiguous axial images were obtained from the base of the skull through the vertex without intravenous contrast. RADIATION DOSE REDUCTION: This exam was performed according to the departmental dose-optimization program which includes automated exposure control, adjustment of the mA and/or kV according to patient size and/or use of iterative reconstruction technique. COMPARISON:  Head CT 01/13/2022.  MRI brain 10/31/2021. FINDINGS: Brain: No acute intracranial hemorrhage. Stable background of mild chronic small-vessel disease. Gray-white differentiation is preserved. No hydrocephalus or extra-axial collection. No mass effect or midline shift.  Vascular: No hyperdense vessel or unexpected calcification. Skull: No calvarial fracture or  suspicious bone lesion. Skull base is unremarkable. Sinuses/Orbits: No acute finding. Other: None. ASPECTS [facility] Stroke Program Early CT Score) - Ganglionic level infarction (caudate, lentiform nuclei, internal capsule, insula, M1-M3 cortex): 7 - Supraganglionic infarction (M4-M6 cortex): 3 Total score (0-10 with 10 being normal): 10 IMPRESSION: 1. No acute intracranial hemorrhage or evidence of acute large vessel territory infarct. ASPECT score is 10. 2. Stable background of mild chronic small-vessel disease. Electronically Signed   By: Ryan Chess M.D.   On: 11/08/2023 15:52   _______________________________________________________________________________________________________ Latest  Blood pressure 139/76, pulse 81, temperature 98.5 F (36.9 C), temperature source Oral, resp. rate (!) 23, weight 71.4 kg, SpO2 99%.   Vitals  labs and radiology finding personally reviewed  Review of Systems:    Pertinent positives include:   fatigue, right knee gave out  Constitutional:  No weight loss, night sweats, Fevers, chills, weight loss  HEENT:  No headaches, Difficulty swallowing,Tooth/dental problems,Sore throat,  No sneezing, itching, ear ache, nasal congestion, post nasal drip,  Cardio-vascular:  No chest pain, Orthopnea, PND, anasarca, dizziness, palpitations.no Bilateral lower extremity swelling  GI:  No heartburn, indigestion, abdominal pain, nausea, vomiting, diarrhea, change in bowel habits, loss of appetite, melena, blood in stool, hematemesis Resp:  no shortness of breath at rest. No dyspnea on exertion, No excess mucus, no productive cough, No non-productive cough, No coughing up of blood.No change in color of mucus.No wheezing. Skin:  no rash or lesions. No jaundice GU:  no dysuria, change in color of urine, no urgency or frequency. No straining to urinate.  No flank pain.  Musculoskeletal:  No joint pain or no joint swelling. No decreased range of motion. No back pain.   Psych:  No change in mood or affect. No depression or anxiety. No memory loss.  Neuro: no localizing neurological complaints, no tingling, no weakness, no double vision, no gait abnormality, no slurred speech, no confusion  All systems reviewed and apart from HOPI all are negative _______________________________________________________________________________________________ Past Medical History:   Past Medical History:  Diagnosis Date   Arthritis    Atrial fibrillation (HCC)    Chest pain    a. 2014: NST with no evidence of ischemia   CKD (chronic kidney disease) stage 3, GFR 30-59 ml/min (HCC)    Diastolic dysfunction    grade 2 with dyspnea on exertion   H/O echocardiogram    a. 2014: echo showing EF of 65-70% with Grade 1 DD, moderate TR, and mild to moderate MR   Hyperlipidemia    Hypertension    Near syncope    Osteopenia       Past Surgical History:  Procedure Laterality Date   ABDOMINAL HYSTERECTOMY     TAH/BSO (menorrhagia)   bunion repair     HERNIA REPAIR      Social History:  Ambulatory  cane, walker       reports that she has never smoked. She has never used smokeless tobacco. She reports current alcohol use. She reports that she does not use drugs.   Family History:   Family History  Problem Relation Age of Onset   Kidney failure Mother    Hypertension Father    Heart disease Father        pacemaker- followedby Dr. francyne   Diabetes Sister    Hypertension Sister    Diabetes Sister    Hypertension Sister    Hypertension Daughter  Hypertension Daughter    Breast cancer Daughter    ______________________________________________________________________________________________ Allergies: Allergies  Allergen Reactions   Codeine Nausea And Vomiting   Codeine Nausea And Vomiting and Other (See Comments)    Dizziness, also     Prior to Admission medications   Medication Sig Start Date End Date Taking? Authorizing Provider  amLODipine   (NORVASC ) 10 MG tablet TAKE 1 TABLET BY MOUTH DAILY 10/08/23   Duanne Hogan DASEN, MD  Cholecalciferol (VITAMIN D3 PO) Take 165 mcg by mouth.    [provider]  diclofenac  Sodium (VOLTAREN ) 1 % GEL Apply 2 g topically 4 (four) times daily. 05/26/22   Teresa Shelba SAUNDERS, NP  ELIQUIS  5 MG TABS tablet TAKE 1 TABLET BY MOUTH TWICE  DAILY 06/04/23   O'Neal, Darryle Ned, MD  lidocaine  (XYLOCAINE ) 5 % ointment Apply 1 Application topically as needed. 05/26/22   Teresa Shelba SAUNDERS, NP  losartan  (COZAAR ) 100 MG tablet TAKE 1 TABLET BY MOUTH EVERY DAY 11/06/23   Duanne Hogan DASEN, MD  metoprolol  tartrate (LOPRESSOR ) 25 MG tablet Take 0.5 tablets (12.5 mg total) by mouth 2 (two) times daily. 10/22/23   Croitoru, Mihai, MD  Multiple Vitamins-Minerals (CENTRUM SILVER 50+WOMEN) TABS Take 1 tablet by mouth daily with breakfast.    [provider]  neomycin -polymyxin-hydrocortisone (CORTISPORIN) OTIC solution Place 3 drops into the right ear 4 (four) times daily. 04/26/23   Duanne Hogan DASEN, MD  Omega-3 Fatty Acids  (OMEGA 3 500 PO) Take by mouth.    [provider]  rosuvastatin  (CRESTOR ) 10 MG tablet TAKE 1 TABLET BY MOUTH ONCE  DAILY 09/03/23   Duanne Hogan DASEN, MD  terbinafine  (LAMISIL ) 250 MG tablet Take 1 tablet (250 mg total) by mouth daily. 06/18/23   Duanne Hogan DASEN, MD  vitamin B-12 (CYANOCOBALAMIN ) 100 MCG tablet Take 100 mcg by mouth daily.    [provider]    ___________________________________________________________________________________________________ Physical Exam:    11/08/2023    6:30 PM 11/08/2023    5:15 PM 11/08/2023    5:00 PM  Vitals with BMI  Systolic 139 146 873  Diastolic 76 73 85  Pulse 81 76 71     1. General:  in No  Acute distress    Chronically ill   -appearing 2. Psychological: Alert and   Oriented 3. Head/ENT:    Dry Mucous Membranes                          Head Non traumatic, neck supple                           Poor  Dentition 4. SKIN:  decreased Skin turgor,  Skin clean Dry and intact no rash    5. Heart: Regular rate and rhythm no  Murmur, no Rub or gallop 6. Lungs:   no wheezes or crackles   7. Abdomen: Soft,  non-tender, Non distended   obese  bowel sounds present 8. Lower extremities: no clubbing, cyanosis, no  edema 9. Neurologically  strength 5 out of 5 in all 4 extremities cranial nerves II through XII intact 10. MSK: Normal range of motion    Chart has been reviewed  ______________________________________________________________________________________________  Assessment/Plan  78 y.o. female with medical history significant of CKD3b, Vasovagal syncope, A.fib on eliquis , HTN, HLD  Admitted for   Syncope,    Present on Admission:  Syncope  CKD (chronic kidney disease) stage 3, GFR  30-59 ml/min (HCC)  Chronic diastolic CHF (congestive heart failure) (HCC)  Mixed hyperlipidemia  Paroxysmal atrial fibrillation (HCC)  Alcohol abuse     CKD (chronic kidney disease) stage 3, GFR 30-59 ml/min (HCC)  -chronic avoid nephrotoxic medications such as NSAIDs, Vanco Zosyn combo,  avoid hypotension, continue to follow renal function   Chronic diastolic CHF (congestive heart failure) (HCC) Currently appears to be slightly on the dry side we will gently rehydrate monitor fluid status  Mixed hyperlipidemia Chronic stable continue Crestor  10 mg a day  Paroxysmal atrial fibrillation (HCC) Chronic stable continue metoprolol  12.5 mg p.o. twice daily and Eliquis  5 mg p.o. twice daily  Syncope Patient has longstanding history of vasovagal syncope's.  States today his symptoms were somewhat worse but of note she did have alcohol on board at the time and also was straining to get up when her leg gave out from arthritis Neurology seen patient does not seem that her symptoms are consistent with CVA. EEG has been ordered for completion Obtain echogram Troponin unremarkable EKG nonacute Strongly  encourage patient to avoid binge drinking Observe overnight on telemetry Patient would benefit from close follow-up with cardiology  Alcohol abuse Patient endorses binge drinking Spoke about importance of avoiding such behavior Will need to reiterate prior to discharge monitor for any sign of withdrawal   Other plan as per orders.  DVT prophylaxis:  eliquis     Code Status:    Code Status: Not on file FULL CODE   as per patient   I had personally discussed CODE STATUS with patient    ACP   none     Family Communication:   Family not at  Bedside     Diet heart healthy Diet Orders (From admission, onward)     Start     Ordered   11/08/23 1534  Diet NPO time specified  Diet effective now       Comments: NPO until stroke swallow screen is complete   11/08/23 1534            Disposition Plan:    To home once workup is complete and patient is stable   Following barriers for discharge:                             Syncope work up is complete                                    Consult Orders  (From admission, onward)           Start     Ordered   11/08/23 1818  Consult to hospitalist  Paged by ceira  Once       Provider:  (Not yet assigned)  Question Answer Comment  Place call to: [facility]   Reason for Consult Admit      11/08/23 1817                               Would benefit from PT/OT eval prior to DC  Ordered                      Consults called: Neurology is aware   Admission status:  ED Disposition     ED Disposition  Admit   Condition  --  Comment  Hospital Area: [facility] [[id number]]  Level of Care: Telemetry Cardiac [[id number]]  May place patient in observation at [facility] or [facility] if equivalent level of care is available:: No  Covid Evaluation: Asymptomatic - no recent exposure (last 10 days) testing not required  Diagnosis: Syncope [206001]  Admitting Physician: Carolle Ishii [[id number]]  Attending  Physician: Adianna Darwin [[id number]]          Obs     Level of care     tele  For  24H     medical floor       progressive     stepdown   tele indefinitely please discontinue once patient no lo   Lab Results  Component Value Date   SARSCOV2NAA NEGATIVE 01/13/2022      Makail Watling 11/08/2023, 9:53 PM    [facility]     after 2 AM please page floor coverage PA If 7AM-7PM, please contact the day team taking care of the patient using [email or web address]

## 2023-11-08 NOTE — Subjective & Objective (Signed)
 Pt was with family had a few drinks, she has arthritis and her right knee gave out from under her that happens a lot.  She had a sense of doom, she could hear her grandson talking to her but was too weak to answer  Per EMS and family she was a bit weak for a while all over. The whole episode lasted a few min. NO CP , no SOB She was not in any pain , but right before this happened she was straining trying to stand up and she could not Was brought in as code stroke  Seen by neurology  She had 3 shot of liquor today but reports not every day When she drinks she drinks heavy but she does not drink every day She drinks heavy about twice  a week She does not think this episode was related to drinking She does have syncopal episodes, she says her dead did too But non of them as scary as this one today She has been seen by cardiology

## 2023-11-08 NOTE — ED Triage Notes (Signed)
 Pt BIB [facility] from home as Code Stroke with LKN 1200. Family reports she was drinking with the family since 1200 with c/o back pain & Rt knee pain. EMS reports they arrived on scene at 1440 & pt had been assisted to the ground& became unresponsive with downward glancing eyes. Once en route to ED pt was back to baseline & A/Ox4, still reporting Rt sided weakness. Upon arrival to ED she remains A/Ox4 & some weakness remaining in Rt arm. 168/90, 78 bpm, 98% RA, CBG 147, 18g Lt AC PIV.

## 2023-11-08 NOTE — Assessment & Plan Note (Signed)
Chronic stable continue Crestor 10 mg a day

## 2023-11-08 NOTE — Assessment & Plan Note (Signed)
 Currently appears to be slightly on the dry side we will gently rehydrate monitor fluid status

## 2023-11-08 NOTE — Consult Note (Addendum)
 NEURO HOSPITALIST  CONSULT   Requesting Physician: Dr. Francesca    Chief Complaint: right sided weakness  History obtained from:  EMS/patient/Chart  HPI:                                                                                                                                         Christine Hogan is an 78 y.o. female wioth PMH of a. Fib (on eliquis ), HTN, HLD, CKD 3 who presented to [facility] ED as a code stroke for right side weakness.  Per report patient was at home drinking ETOH (3 shots of liquor) and she became unresponsive. Patient told EMS she could hear them, but she could not respond. No prior stroke history.  Patient tells me that she drinks hard liquor, most days in a week. Roughly drinks 24 Oz. She has been drinking more during holidays. She had a few shots earlier today but did not feel drunk. She was walking in her home when her R knee gave out and she was able to hold on to the couch and did not fall. She helped herself to the ground. She felt weak in the R leg. Some pain in the knee. R knee has given out in the past and she attributes this to arthritis around her R knee. She reports that she fell she did not have strength to get up. She called for her grandson to get her cane and had no problems with words. She kind of passed out and when she came around, she was disoriented and could not talk. The entire episode lasted a couple mins.   ED course:  BP168/90 BG: 147 CTH: no hemorrhage CTA: no LVO   Date last known well: 11/08/23 Time last known well: 1200 tNK Given: No: no lateralizing findings Thrombectomy: no, not offered due to low suspicion for stroke and no deficit and no LVO. Modified Rankin: 0 NIHSS:1 for sensation, but resolved before end of exam    Past Medical History:  Diagnosis Date   Arthritis    Atrial fibrillation (HCC)    Chest pain    a. 2014: NST with no evidence of ischemia   CKD (chronic kidney  disease) stage 3, GFR 30-59 ml/min (HCC)    Diastolic dysfunction    grade 2 with dyspnea on exertion   H/O echocardiogram    a. 2014: echo showing EF of 65-70% with Grade 1 DD, moderate TR, and mild to moderate MR   Hyperlipidemia    Hypertension    Near syncope    Osteopenia     Past Surgical History:  Procedure Laterality Date   ABDOMINAL  HYSTERECTOMY     TAH/BSO (menorrhagia)   bunion repair     HERNIA REPAIR      Family History  Problem Relation Age of Onset   Kidney failure Mother    Hypertension Father    Heart disease Father        pacemaker- followedby Dr. francyne   Diabetes Sister    Hypertension Sister    Diabetes Sister    Hypertension Sister    Hypertension Daughter    Hypertension Daughter    Breast cancer Daughter          Social History:  reports that she has never smoked. She has never used smokeless tobacco. She reports current alcohol use. She reports that she does not use drugs.  Allergies:  Allergies  Allergen Reactions   Codeine Nausea And Vomiting   Codeine Nausea And Vomiting and Other (See Comments)    Dizziness, also    Medications:                                                                                                                          PTA medications reviewed. Patient on eliquis , amlodipine , cozaar , lopressor , crestor  10mg .    ROS:                                                                                                                                       History obtained from the patient  General ROS: negative for - chills, fatigue, fever, night sweats, weight gain or weight loss Psychological ROS: negative for - , hallucinations, memory difficulties, mood swings or  Ophthalmic ROS: negative for - blurry vision, double vision, eye pain or loss of vision ENT ROS: negative for - epistaxis, nasal discharge, oral lesions, sore throat, tinnitus or vertigo Respiratory ROS: negative for - cough,  shortness of breath  or wheezing Cardiovascular ROS: negative for - chest pain, dyspnea on exertion,  Gastrointestinal ROS: negative for - abdominal pain, diarrhea,  nausea/vomiting or stool incontinence Genito-Urinary ROS: negative for - dysuria, hematuria, incontinence or urinary frequency/urgency Musculoskeletal ROS: negative for - joint swelling or muscular weakness Neurological ROS: as noted in HPI   General Examination:  Weight 71.4 kg.  Physical Exam  Constitutional: Appears well-developed and well-nourished.  Psych: Affect appropriate to situation Eyes: Normal external eye and conjunctiva. HENT: Normocephalic, no lesions, without obvious abnormality. Missing dentititon Musculoskeletal-no joint tenderness, deformity or swelling Cardiovascular: Normal rate and regular rhythm.  Respiratory: Effort normal, non-labored breathing saturations WNL GI: Soft.  No distension. There is no tenderness.  Skin: WDI  Neurological Examination Mental Status: Alert, oriented, thought content appropriate.  Speech fluent without evidence of aphasia.  Able to follow commands without difficulty. Cranial Nerves: II:  Visual fields grossly normal,  III,IV, VI: ptosis not present, extra-ocular motions intact bilaterally, pupils equal, round, reactive to light and accommodation V,VII: smile symmetric, facial light touch sensation normal bilaterally VIII: hearing normal bilaterally IX,X: uvula rises midline XI: bilateral shoulder shrug XII: midline tongue extension Motor: Right : Upper extremity   5/5  Left:     Upper extremity   5/5  Lower extremity   5/5   Lower extremity   5/5 Tone and bulk:normal tone throughout; no atrophy noted Sensory:  light touch intact throughout, bilaterally Cerebellar: normal finger-to-nose, normal heel-to-shin test, right hand with slower finger taps. Gait: deferred   Lab Results: Basic  Metabolic Panel: Recent Labs  Lab 11/08/23 1540  NA 148*  K 3.5  CL 115*  GLUCOSE 116*  BUN 27*  CREATININE 2.10*    CBC: Recent Labs  Lab 11/08/23 1540  HGB 13.6  HCT 40.0    Lipid Panel: No results for input(s): CHOL, TRIG, HDL, CHOLHDL, VLDL, LDLCALC in the last 168 hours.  CBG: Recent Labs  Lab 11/08/23 1535  GLUCAP 112*    Imaging: CT HEAD CODE STROKE WO CONTRAST Result Date: 11/08/2023 CLINICAL DATA:  Code stroke. Neuro deficit, acute, stroke suspected. Right-sided weakness. EXAM: CT HEAD WITHOUT CONTRAST TECHNIQUE: Contiguous axial images were obtained from the base of the skull through the vertex without intravenous contrast. RADIATION DOSE REDUCTION: This exam was performed according to the departmental dose-optimization program which includes automated exposure control, adjustment of the mA and/or kV according to patient size and/or use of iterative reconstruction technique. COMPARISON:  Head CT 01/13/2022.  MRI brain 10/31/2021. FINDINGS: Brain: No acute intracranial hemorrhage. Stable background of mild chronic small-vessel disease. Gray-white differentiation is preserved. No hydrocephalus or extra-axial collection. No mass effect or midline shift. Vascular: No hyperdense vessel or unexpected calcification. Skull: No calvarial fracture or suspicious bone lesion. Skull base is unremarkable. Sinuses/Orbits: No acute finding. Other: None. ASPECTS [facility] Stroke Program Early CT Score) - Ganglionic level infarction (caudate, lentiform nuclei, internal capsule, insula, M1-M3 cortex): 7 - Supraganglionic infarction (M4-M6 cortex): 3 Total score (0-10 with 10 being normal): 10 IMPRESSION: 1. No acute intracranial hemorrhage or evidence of acute large vessel territory infarct. ASPECT score is 10. 2. Stable background of mild chronic small-vessel disease. Electronically Signed   By: Ryan Chess M.D.   On: 11/08/2023 15:52       Harlene Pouch, MSN,  NP-C [facility] [phone]  11/08/2023, 3:54 PM   Attending physician note to follow with Assessment and plan .   Assessment: Kaelynne C Mickle is an 78 y.o. female wioth PMH of a. Fib (on eliquis ), HTN, HLD, CKD 3 who presented to [facility] ED as a code stroke for right side weakness. CTH was negative for hemorrhage. CTA was negative for LVO. Patient is not a candidate for TPA due to absence of stroke like symptoms.   Stroke Risk Factors - atrial fibrillation, hyperlipidemia, and hypertension  Recommendations: rEEG.   --Please page the Stroke team from 8am-4pm.   You can look them up on [email or web address]    NEUROHOSPITALIST ADDENDUM Performed a face to face diagnostic evaluation.   I have reviewed the contents of history and physical exam as documented by PA/ARNP/Resident and agree with above documentation.  I have discussed and formulated the above plan as documented. Edits to the note have been made as needed.  Impression/Key exam findings/Plan: Had 3 shots this AM, drinks roughly 24Oz of hard liquor with poor po fluid intake. Was walking when her R knee gave out and was on the floor.  initially did not have any problems with language but was struggling with getting up and did not have enough strength. She then syncopized as she was trying to muster enough strength to get up and was not very responsive for EMS. The entire episode appears to have lasted a couple mins. Now back to her baseline.  Low overall suspicion that this was a stroke, no post ictal period. Does endorse EtOH use and poor po fluid intake today.  Will get a routine EEG but if negative, no further inpatient neurologic evaluation. No neuro deficit noted on my exam.  Arnelle Nale, MD [facility] [phone]   If 7pm to 7am, please call on call as listed on AMION.

## 2023-11-08 NOTE — ED Provider Notes (Signed)
 [name] Provider Note  CSN: [id number] Arrival date & time: 11/08/23 1532  Chief Complaint(s) Code Stroke  HPI Christine Hogan is a 78 y.o. female history of A-fib, CKD, diastolic CHF, hyperlipidemia, hypertension presenting to the emergency department with syncope.  Patient was originally code stroke.  She reports that she was celebrating the holidays with family and drinking alcohol, she got up and was walking when she felt like her right leg gave out.  She reports that she was assisted to the ground and then was able to get to the couch.  She then reports that she became unconscious.  She does not remember anything after this until waking up with paramedics.  Paramedics report that patient was unresponsive to pain on their arrival but improved on the way to the hospital and was awake and alert on arrival.  No clear history of seizure activity.  Patient reports currently feels back to baseline.  She denies any chest pain, shortness of breath, abdominal pain or any other symptoms during this episode but reports sensation of impending doom, reports that this sensation is currently resolved.   Past Medical History Past Medical History:  Diagnosis Date   Arthritis    Atrial fibrillation (HCC)    Chest pain    a. 2014: NST with no evidence of ischemia   CKD (chronic kidney disease) stage 3, GFR 30-59 ml/min (HCC)    Diastolic dysfunction    grade 2 with dyspnea on exertion   H/O echocardiogram    a. 2014: echo showing EF of 65-70% with Grade 1 DD, moderate TR, and mild to moderate MR   Hyperlipidemia    Hypertension    Near syncope    Osteopenia    Patient Active Problem List   Diagnosis Date Noted   Acute pain of right knee 10/25/2023   Decreased hearing of right ear 07/18/2023   Viral URI with cough 03/20/2023   CKD (chronic kidney disease) stage 3, GFR 30-59 ml/min (HCC)    Abnormal finding on MRI of brain 09/15/2019   Alteration of  consciousness 08/26/2019   Syncope 05/26/2019   Nonrheumatic mitral (valve) insufficiency 10/08/2018   Vasovagal syncope 10/08/2018   Osteopenia    Diastolic dysfunction    Paroxysmal atrial fibrillation (HCC) 02/09/2017   Chronic renal insufficiency 06/28/2013   Chest pain 06/27/2013   Essential hypertension    Mixed hyperlipidemia    Home Medication(s) Prior to Admission medications   Medication Sig Start Date End Date Taking? Authorizing Provider  amLODipine  (NORVASC ) 10 MG tablet TAKE 1 TABLET BY MOUTH DAILY 10/08/23   Duanne Butler DASEN, MD  Cholecalciferol (VITAMIN D3 PO) Take 165 mcg by mouth.    [provider]  diclofenac  Sodium (VOLTAREN ) 1 % GEL Apply 2 g topically 4 (four) times daily. 05/26/22   Teresa Shelba SAUNDERS, NP  ELIQUIS  5 MG TABS tablet TAKE 1 TABLET BY MOUTH TWICE  DAILY 06/04/23   O'Neal, Darryle Ned, MD  lidocaine  (XYLOCAINE ) 5 % ointment Apply 1 Application topically as needed. 05/26/22   Teresa Shelba SAUNDERS, NP  losartan  (COZAAR ) 100 MG tablet TAKE 1 TABLET BY MOUTH EVERY DAY 11/06/23   Duanne Butler DASEN, MD  metoprolol  tartrate (LOPRESSOR ) 25 MG tablet Take 0.5 tablets (12.5 mg total) by mouth 2 (two) times daily. 10/22/23   Croitoru, Mihai, MD  Multiple Vitamins-Minerals (CENTRUM SILVER 50+WOMEN) TABS Take 1 tablet by mouth daily with breakfast.    [provider]  neomycin -polymyxin-hydrocortisone (CORTISPORIN) OTIC solution Place 3 drops into the right ear 4 (four) times daily. 04/26/23   Duanne Butler DASEN, MD  Omega-3 Fatty Acids  (OMEGA 3 500 PO) Take by mouth.    [provider]  rosuvastatin  (CRESTOR ) 10 MG tablet TAKE 1 TABLET BY MOUTH ONCE  DAILY 09/03/23   Duanne Butler DASEN, MD  terbinafine  (LAMISIL ) 250 MG tablet Take 1 tablet (250 mg total) by mouth daily. 06/18/23   Duanne Butler DASEN, MD  vitamin B-12 (CYANOCOBALAMIN ) 100 MCG tablet Take 100 mcg by mouth daily.    [provider]                                                                                                                                     Past Surgical History Past Surgical History:  Procedure Laterality Date   ABDOMINAL HYSTERECTOMY     TAH/BSO (menorrhagia)   bunion repair     HERNIA REPAIR     Family History Family History  Problem Relation Age of Onset   Kidney failure Mother    Hypertension Father    Heart disease Father        pacemaker- followedby Dr. francyne   Diabetes Sister    Hypertension Sister    Diabetes Sister    Hypertension Sister    Hypertension Daughter    Hypertension Daughter    Breast cancer Daughter     Social History Social History   Tobacco Use   Smoking status: Never   Smokeless tobacco: Never  Vaping Use   Vaping status: Never Used  Substance Use Topics   Alcohol use: Yes    Comment: occasionally   Drug use: No   Allergies Codeine and Codeine  Review of Systems Review of Systems  All other systems reviewed and are negative.   Physical Exam Vital Signs  I have reviewed the triage vital signs BP 139/76   Pulse 81   Temp 98.5 F (36.9 C) (Oral)   Resp (!) 23   Wt 71.4 kg   SpO2 99%   BMI 26.19 kg/m  Physical Exam Vitals and nursing note reviewed.  Constitutional:      General: She is not in acute distress.    Appearance: She is well-developed.  HENT:     Head: Normocephalic and atraumatic.     Mouth/Throat:     Mouth: Mucous membranes are dry.  Eyes:     Pupils: Pupils are equal, round, and reactive to light.  Cardiovascular:     Rate and Rhythm: Normal rate and regular rhythm.     Heart sounds: No murmur heard. Pulmonary:     Effort: Pulmonary effort is normal. No respiratory distress.     Breath sounds: Normal breath sounds.  Abdominal:     General: Abdomen is flat.     Palpations: Abdomen is soft.     Tenderness: There is no abdominal tenderness.  Musculoskeletal:  General: No tenderness.     Right lower leg: No edema.     Left lower leg: No edema.   Skin:    General: Skin is warm and dry.  Neurological:     General: No focal deficit present.     Mental Status: She is alert. Mental status is at baseline.     Comments: Cranial nerves II through XII intact, strength 5 out of 5 in the bilateral upper and lower extremities, no sensory deficit to light touch, no dysmetria on finger-nose-finger testing, ambulatory with steady gait  Psychiatric:        Mood and Affect: Mood normal.        Behavior: Behavior normal.     ED Results and Treatments Labs (all labs ordered are listed, but only abnormal results are displayed) Labs Reviewed  ETHANOL - Abnormal; Notable for the following components:      Result Value   Alcohol, Ethyl (B) 271 (*)    All other components within normal limits  CBC - Abnormal; Notable for the following components:   RDW 15.8 (*)    All other components within normal limits  DIFFERENTIAL - Abnormal; Notable for the following components:   Neutro Abs 1.6 (*)    Lymphs Abs 4.1 (*)    All other components within normal limits  COMPREHENSIVE METABOLIC PANEL - Abnormal; Notable for the following components:   CO2 19 (*)    Glucose, Bld 118 (*)    BUN 25 (*)    Creatinine, Ser 1.79 (*)    GFR, Estimated 29 (*)    Anion gap 17 (*)    All other components within normal limits  URINALYSIS, ROUTINE W REFLEX MICROSCOPIC - Abnormal; Notable for the following components:   Color, Urine STRAW (*)    Hgb urine dipstick SMALL (*)    Protein, ur 100 (*)    Bacteria, UA RARE (*)    All other components within normal limits  I-STAT CHEM 8, ED - Abnormal; Notable for the following components:   Sodium 148 (*)    Chloride 115 (*)    BUN 27 (*)    Creatinine, Ser 2.10 (*)    Glucose, Bld 116 (*)    Calcium , Ion 1.14 (*)    TCO2 21 (*)    All other components within normal limits  CBG MONITORING, ED - Abnormal; Notable for the following components:   Glucose-Capillary 112 (*)    All other components within normal  limits  PROTIME-INR  APTT  RAPID URINE DRUG SCREEN, HOSP PERFORMED  TROPONIN I (HIGH SENSITIVITY)                                                                                                                          Radiology CT ANGIO HEAD NECK W WO CM (CODE STROKE) Result Date: 11/08/2023 CLINICAL DATA:  Neuro deficit, acute, stroke suspected. Right-sided weakness. EXAM: CT ANGIOGRAPHY HEAD AND NECK WITH AND WITHOUT CONTRAST TECHNIQUE: Multidetector  CT imaging of the head and neck was performed using the standard protocol during bolus administration of intravenous contrast. Multiplanar CT image reconstructions and MIPs were obtained to evaluate the vascular anatomy. Carotid stenosis measurements (when applicable) are obtained utilizing NASCET criteria, using the distal internal carotid diameter as the denominator. RADIATION DOSE REDUCTION: This exam was performed according to the departmental dose-optimization program which includes automated exposure control, adjustment of the mA and/or kV according to patient size and/or use of iterative reconstruction technique. CONTRAST:  75mL OMNIPAQUE  IOHEXOL  350 MG/ML SOLN COMPARISON:  MRA head and neck 10/21/2019. CT thoracic spine 09/12/2020. FINDINGS: CTA NECK FINDINGS Aortic arch: Standard branching with mild atherosclerotic calcification. No significant stenosis of the arch vessel origins. Right carotid system: Patent with a small amount of calcified plaque at the carotid bifurcation. No evidence of a significant stenosis or dissection. Left carotid system: Patent with a small amount mixed calcified and soft plaque at the carotid bifurcation. No evidence of a significant stenosis or dissection. Vertebral arteries: Patent without evidence of a significant stenosis or dissection. Codominant. Skeleton: Moderate cervical spondylosis. Other neck: No evidence of cervical lymphadenopathy or mass. Upper chest: 4 mm nodule in the posterior left upper lobe abutting the  major fissure, unchanged from 09/12/2020 and considered benign with no follow-up imaging recommended. Review of the MIP images confirms the above findings CTA HEAD FINDINGS Anterior circulation: The internal carotid arteries are patent from skull base to carotid termini with calcified plaque resulting in a moderate to severe stenosis of the left cavernous segment. There is interval age indeterminate occlusion of the non-dominant left A1 segment with a residual opacified stump at the left A1-A2 junction. The left A2 segment is supplied by a robust anterior communicating artery, and the proximal right ACA is widely patent. Both MCAs are patent without evidence of a proximal branch occlusion. There are moderate to severe proximal right M1 and proximal right M2 stenoses. No aneurysm is identified. Posterior circulation: The intracranial vertebral arteries are widely patent to the basilar. Patent PICA, AICA, and SCA origins are visualized bilaterally. The basilar artery is widely patent. There are small posterior communicating arteries bilaterally. Both PCAs are patent without evidence of a flow limiting proximal stenosis. There is a moderate distal left P2 stenosis. No aneurysm is identified. Venous sinuses: Patent. Anatomic variants: Duplicated right MCA. Review of the MIP images confirms the above findings These results were communicated to Dr. Vanessa at 4:07 pm on 11/08/2023 by text page via the [facility] messaging system. IMPRESSION: 1. No definite emergent large vessel occlusion. 2. 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 3. 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 4. Moderate to severe proximal right M1 and M2 stenoses. 5. Right lead patent cervical carotid and vertebral arteries. 6.  Aortic Atherosclerosis (ICD10-I70.0). Electronically Signed   By: Dasie Hamburg M.D.   On: 11/08/2023  16:24   CT HEAD CODE STROKE WO CONTRAST Result Date: 11/08/2023 CLINICAL DATA:  Code stroke. Neuro deficit, acute, stroke suspected. Right-sided weakness. EXAM: CT HEAD WITHOUT CONTRAST TECHNIQUE: Contiguous axial images were obtained from the base of the skull through the vertex without intravenous contrast. RADIATION DOSE REDUCTION: This exam was performed according to the departmental dose-optimization program which includes automated exposure control, adjustment of the mA and/or kV according to patient size and/or use of iterative reconstruction technique. COMPARISON:  Head CT 01/13/2022.  MRI brain  10/31/2021. FINDINGS: Brain: No acute intracranial hemorrhage. Stable background of mild chronic small-vessel disease. Gray-white differentiation is preserved. No hydrocephalus or extra-axial collection. No mass effect or midline shift. Vascular: No hyperdense vessel or unexpected calcification. Skull: No calvarial fracture or suspicious bone lesion. Skull base is unremarkable. Sinuses/Orbits: No acute finding. Other: None. ASPECTS [facility] Stroke Program Early CT Score) - Ganglionic level infarction (caudate, lentiform nuclei, internal capsule, insula, M1-M3 cortex): 7 - Supraganglionic infarction (M4-M6 cortex): 3 Total score (0-10 with 10 being normal): 10 IMPRESSION: 1. No acute intracranial hemorrhage or evidence of acute large vessel territory infarct. ASPECT score is 10. 2. Stable background of mild chronic small-vessel disease. Electronically Signed   By: Ryan Chess M.D.   On: 11/08/2023 15:52    Pertinent labs & imaging results that were available during my care of the patient were reviewed by me and considered in my medical decision making (see MDM for details).  Medications Ordered in ED Medications  iohexol  (OMNIPAQUE ) 350 MG/ML injection 75 mL (75 mLs Intravenous Contrast Given 11/08/23 1552)  sodium chloride  0.9 % bolus 1,000 mL (1,000 mLs Intravenous New Bag/Given 11/08/23 1713)                                                                                                                                      Procedures .Critical Care  Performed by: Francesca Elsie CROME, MD Authorized by: Francesca Elsie CROME, MD   Critical care provider statement:    Critical care time (minutes):  30   Critical care was necessary to treat or prevent imminent or life-threatening deterioration of the following conditions:  CNS failure or compromise (code stroke evaluation)   Critical care was time spent personally by me on the following activities:  Development of treatment plan with patient or surrogate, discussions with consultants, evaluation of patient's response to treatment, examination of patient, ordering and review of laboratory studies, ordering and review of radiographic studies, ordering and performing treatments and interventions, pulse oximetry, re-evaluation of patient's condition and review of old charts   (including critical care time)  Medical Decision Making / ED Course   MDM:  78 year old female presenting to the emergency department syncopal episode witnessed by family.  Patient was originally a code stroke and was evaluated by neurology on arrival.  They felt her symptoms were less likely to be stroke related in nature.  Could potentially be seizure although no seizure activity, they are recommending EEG.  Symptoms seem most consistent with syncopal episode.  Patient appears dehydrated on exam has a possible slight AKI, will give fluids.  Her alcohol level is elevated.  Could be orthostatic or vasovagal.  Patient does have some medical comorbidities including diastolic CHF which does not raise the risk of a cardiac cause of syncope.  Differential also but less likely includes TIA given reported right leg weakness.  Will follow-up remainder of results.  Anticipate patient will likely  need to be admitted for further monitoring.  Clinical Course as of 11/08/23 1859  Thu Nov 08, 2023  [id number] Discussed with Dr. Silvester who will admit patient for syncope.  [WS]    Clinical Course User Index [WS] Francesca Elsie CROME, MD     Additional history obtained: -Additional history obtained from ems -External records from outside source obtained and reviewed including: Chart review including previous notes, labs, imaging, consultation notes including prior notes    Lab Tests: -I ordered, reviewed, and interpreted labs.   The pertinent results include:   Labs Reviewed  ETHANOL - Abnormal; Notable for the following components:      Result Value   Alcohol, Ethyl (B) 271 (*)    All other components within normal limits  CBC - Abnormal; Notable for the following components:   RDW 15.8 (*)    All other components within normal limits  DIFFERENTIAL - Abnormal; Notable for the following components:   Neutro Abs 1.6 (*)    Lymphs Abs 4.1 (*)    All other components within normal limits  COMPREHENSIVE METABOLIC PANEL - Abnormal; Notable for the following components:   CO2 19 (*)    Glucose, Bld 118 (*)    BUN 25 (*)    Creatinine, Ser 1.79 (*)    GFR, Estimated 29 (*)    Anion gap 17 (*)    All other components within normal limits  URINALYSIS, ROUTINE W REFLEX MICROSCOPIC - Abnormal; Notable for the following components:   Color, Urine STRAW (*)    Hgb urine dipstick SMALL (*)    Protein, ur 100 (*)    Bacteria, UA RARE (*)    All other components within normal limits  I-STAT CHEM 8, ED - Abnormal; Notable for the following components:   Sodium 148 (*)    Chloride 115 (*)    BUN 27 (*)    Creatinine, Ser 2.10 (*)    Glucose, Bld 116 (*)    Calcium , Ion 1.14 (*)    TCO2 21 (*)    All other components within normal limits  CBG MONITORING, ED - Abnormal; Notable for the following components:   Glucose-Capillary 112 (*)    All other components within normal limits  PROTIME-INR  APTT  RAPID URINE DRUG SCREEN, HOSP PERFORMED  TROPONIN I (HIGH SENSITIVITY)     Notable for + alcohol level   EKG   EKG Interpretation Date/Time:  Thursday November 08 2023 16:22:40 EST Ventricular Rate:  76 PR Interval:  221 QRS Duration:  96 QT Interval:  412 QTC Calculation: 464 R Axis:   42  Text Interpretation: Sinus rhythm Prolonged PR interval Consider left atrial enlargement Probable anteroseptal infarct, old Confirmed by Francesca Elsie ([id number]) on 11/08/2023 4:52:39 PM         Imaging Studies ordered: I ordered imaging studies including CT head On my interpretation imaging demonstrates no acute process I independently visualized and interpreted imaging. I agree with the radiologist interpretation   Medicines ordered and prescription drug management: Meds ordered this encounter  Medications   iohexol  (OMNIPAQUE ) 350 MG/ML injection 75 mL   sodium chloride  0.9 % bolus 1,000 mL    -I have reviewed the patients home medicines and have made adjustments as needed   Consultations Obtained: I requested consultation with the neurologist,  and discussed lab and imaging findings as well as pertinent plan - they recommend: doubt stroke    Cardiac Monitoring: The patient was maintained on a cardiac monitor.  I personally viewed and interpreted the cardiac monitored which showed an underlying rhythm of: NSR  Social Determinants of Health:  Diagnosis or treatment significantly limited by social determinants of health: alcohol use   Reevaluation: After the interventions noted above, I reevaluated the patient and found that their symptoms have improved  Co morbidities that complicate the patient evaluation  Past Medical History:  Diagnosis Date   Arthritis    Atrial fibrillation (HCC)    Chest pain    a. 2014: NST with no evidence of ischemia   CKD (chronic kidney disease) stage 3, GFR 30-59 ml/min (HCC)    Diastolic dysfunction    grade 2 with dyspnea on exertion   H/O echocardiogram    a. 2014: echo showing EF of 65-70% with Grade 1 DD,  moderate TR, and mild to moderate MR   Hyperlipidemia    Hypertension    Near syncope    Osteopenia       Dispostion: Disposition decision including need for hospitalization was considered, and patient discharged from emergency department.    Final Clinical Impression(s) / ED Diagnoses Final diagnoses:  Syncope, unspecified syncope type     This chart was dictated using voice recognition software.  Despite best efforts to proofread,  errors can occur which can change the documentation meaning.    Francesca Elsie CROME, MD 11/08/23 [phone]

## 2023-11-08 NOTE — Assessment & Plan Note (Signed)
 Chronic stable continue metoprolol 12.5 mg p.o. twice daily and Eliquis 5 mg p.o. twice daily

## 2023-11-08 NOTE — Assessment & Plan Note (Signed)
 Patient endorses binge drinking Spoke about importance of avoiding such behavior Will need to reiterate prior to discharge monitor for any sign of withdrawal

## 2023-11-08 NOTE — Assessment & Plan Note (Signed)
 Patient has longstanding history of vasovagal syncope's.  States today his symptoms were somewhat worse but of note she did have alcohol on board at the time and also was straining to get up when her leg gave out from arthritis Neurology seen patient does not seem that her symptoms are consistent with CVA. EEG has been ordered for completion Obtain echogram Troponin unremarkable EKG nonacute Strongly encourage patient to avoid binge drinking Observe overnight on telemetry Patient would benefit from close follow-up with cardiology

## 2023-11-08 NOTE — ED Notes (Signed)
 EEG at bedside.

## 2023-11-08 NOTE — Code Documentation (Signed)
 Stroke Response Nurse Documentation Code Documentation  Christine Hogan is a 78 y.o. female arriving to [facility]  via [facility] on 1/2 with past medical hx of Htn, afib, CKD. On Eliquis  (apixaban ) daily. Code stroke was activated by EMS.   Patient from home where she was LKW at 1200 and after that she collapsed to the couch because of lower back pain and R knee pain per patient. EMS reports she was unresponsive to sternal rub but regained consciousness en route. She reported R sided weakness when she awoke, so code stroke was activated.  Stroke team at the bedside on patient arrival. Labs drawn and patient cleared for CT by Dr. Francesca. Patient to CT with team. NIHSS 1, see documentation for details and code stroke times. Patient with right decreased sensation on exam. The following imaging was completed:  CT Head and CTA. Patient is not a candidate for IV Thrombolytic due to taking Eliquis . Patient is not a candidate for IR due to no LVO on imaging per MD.   Care Plan: q30 NIHSS and vitals until 1630 then q2h.   Bedside handoff with ED RN Carlyon.    Lauraine LITTIE Searle  Stroke Response RN

## 2023-11-09 ENCOUNTER — Observation Stay (HOSPITAL_BASED_OUTPATIENT_CLINIC_OR_DEPARTMENT_OTHER): Payer: 59

## 2023-11-09 DIAGNOSIS — R55 Syncope and collapse: Secondary | ICD-10-CM | POA: Diagnosis not present

## 2023-11-09 DIAGNOSIS — R569 Unspecified convulsions: Secondary | ICD-10-CM | POA: Diagnosis not present

## 2023-11-09 LAB — ECHOCARDIOGRAM COMPLETE
Area-P 1/2: 3.99 cm2
Height: 65 in
MV M vel: 5.73 m/s
MV Peak grad: 131.3 mm[Hg]
S' Lateral: 2.8 cm
Weight: 2518.54 [oz_av]

## 2023-11-09 LAB — COMPREHENSIVE METABOLIC PANEL
ALT: 21 U/L (ref 0–44)
AST: 27 U/L (ref 15–41)
Albumin: 3.6 g/dL (ref 3.5–5.0)
Alkaline Phosphatase: 58 U/L (ref 38–126)
Anion gap: 13 (ref 5–15)
BUN: 18 mg/dL (ref 8–23)
CO2: 19 mmol/L — ABNORMAL LOW (ref 22–32)
Calcium: 8.7 mg/dL — ABNORMAL LOW (ref 8.9–10.3)
Chloride: 112 mmol/L — ABNORMAL HIGH (ref 98–111)
Creatinine, Ser: 1.22 mg/dL — ABNORMAL HIGH (ref 0.44–1.00)
GFR, Estimated: 46 mL/min — ABNORMAL LOW (ref >60)
Glucose, Bld: 93 mg/dL (ref 70–99)
Potassium: 3.7 mmol/L (ref 3.5–5.1)
Sodium: 144 mmol/L (ref 135–145)
Total Bilirubin: 0.6 mg/dL (ref 0.0–1.2)
Total Protein: 6.3 g/dL — ABNORMAL LOW (ref 6.5–8.1)

## 2023-11-09 LAB — CBC
HCT: 35.4 % — ABNORMAL LOW (ref 36.0–46.0)
Hemoglobin: 11.1 g/dL — ABNORMAL LOW (ref 12.0–15.0)
MCH: 28.7 pg (ref 26.0–34.0)
MCHC: 31.4 g/dL (ref 30.0–36.0)
MCV: 91.5 fL (ref 80.0–100.0)
Platelets: 170 10*3/uL (ref 150–400)
RBC: 3.87 MIL/uL (ref 3.87–5.11)
RDW: 15.9 % — ABNORMAL HIGH (ref 11.5–15.5)
WBC: 4.9 10*3/uL (ref 4.0–10.5)
nRBC: 0 % (ref 0.0–0.2)

## 2023-11-09 LAB — PREALBUMIN: Prealbumin: 48 mg/dL — ABNORMAL HIGH (ref 18–38)

## 2023-11-09 LAB — PROCALCITONIN: Procalcitonin: <0.1 ng/mL

## 2023-11-09 LAB — PHOSPHORUS: Phosphorus: 2.5 mg/dL (ref 2.5–4.6)

## 2023-11-09 LAB — MAGNESIUM: Magnesium: 1.9 mg/dL (ref 1.7–2.4)

## 2023-11-09 MED ORDER — METOPROLOL SUCCINATE ER 25 MG PO TB24
25.0000 mg | ORAL_TABLET | Freq: Every day | ORAL | 0 refills | Status: AC
Start: 2023-11-09 — End: 2024-11-08

## 2023-11-09 MED ORDER — AMLODIPINE BESYLATE 5 MG PO TABS: 5.0000 mg | ORAL_TABLET | Freq: Every day | ORAL | Status: DC

## 2023-11-09 NOTE — ED Notes (Addendum)
 OT at bedside.

## 2023-11-09 NOTE — Procedures (Addendum)
 Patient Name: Christine Hogan  MRN: [id number]  Epilepsy Attending: Arlin MALVA Krebs  Referring Physician/Provider: Khaliqdina, Salman, MD  Date: 11/08/2023 Duration: 22.42 mins  Patient history: 78 y.o. female wioth PMH of a. Fib (on eliquis ), HTN, HLD, CKD 3 who presented to [facility] ED as a code stroke for right side weakness. EEG to evaluate for seizure  Level of alertness: Awake  AEDs during EEG study: None  Technical aspects: This EEG study was done with scalp electrodes positioned according to the 10-20 International system of electrode placement. Electrical activity was reviewed with band pass filter of 1-70Hz , sensitivity of 7 uV/mm, display speed of 51mm/sec with a 60Hz  notched filter applied as appropriate. EEG data were recorded continuously and digitally stored.  Video monitoring was available and reviewed as appropriate.  Description: The posterior dominant rhythm consists of 8 Hz activity of moderate voltage (25-35 uV) seen predominantly in posterior head regions, symmetric and reactive to eye opening and eye closing. Hyperventilation and photic stimulation were not performed.     IMPRESSION: This study is within normal limits. No seizures or epileptiform discharges were seen throughout the recording.  A normal interictal EEG does not exclude the diagnosis of epilepsy.  [name]

## 2023-11-09 NOTE — Discharge Instructions (Signed)

## 2023-11-09 NOTE — ED Notes (Signed)
 ED TO INPATIENT HANDOFF REPORT  ED Nurse Name and Phone #: Cat [phone]  S Name/Age/Gender Christine Hogan 78 y.o. female Room/Bed: 046C/046C  Code Status   Code Status: Full Code  Home/SNF/Other Home Patient oriented to: self, place, time, and situation Is this baseline? Yes   Triage Complete: Triage complete  Chief Complaint Syncope [R55]  Triage Note Pt BIB [facility] from home as Code Stroke with LKN 1200. Family reports she was drinking with the family since 1200 with c/o back pain & Rt knee pain. EMS reports they arrived on scene at 1440 & pt had been assisted to the ground& became unresponsive with downward glancing eyes. Once en route to ED pt was back to baseline & A/Ox4, still reporting Rt sided weakness. Upon arrival to ED she remains A/Ox4 & some weakness remaining in Rt arm. 168/90, 78 bpm, 98% RA, CBG 147, 18g Lt AC PIV.   Allergies Allergies  Allergen Reactions   Codeine Nausea And Vomiting   Codeine Nausea And Vomiting and Other (See Comments)    Dizziness, also    Level of Care/Admitting Diagnosis ED Disposition     ED Disposition  Admit   Condition  --   Comment  Hospital Area: [facility] [[id number]]  Level of Care: Telemetry Cardiac [[id number]]  May place patient in observation at [facility] or [facility] if equivalent level of care is available:: No  Covid Evaluation: Asymptomatic - no recent exposure (last 10 days) testing not required  Diagnosis: Syncope [206001]  Admitting Physician: DOUTOVA, ANASTASSIA [[id number]]  Attending Physician: DOUTOVA, ANASTASSIA [[id number]]          B Medical/Surgery History Past Medical History:  Diagnosis Date   Arthritis    Atrial fibrillation (HCC)    Chest pain    a. 2014: NST with no evidence of ischemia   CKD (chronic kidney disease) stage 3, GFR 30-59 ml/min (HCC)    Diastolic dysfunction    grade 2 with dyspnea on exertion   H/O echocardiogram    a. 2014: echo showing EF of 65-70% with Grade 1 DD,  moderate TR, and mild to moderate MR   Hyperlipidemia    Hypertension    Near syncope    Osteopenia    Past Surgical History:  Procedure Laterality Date   ABDOMINAL HYSTERECTOMY     TAH/BSO (menorrhagia)   bunion repair     HERNIA REPAIR       A IV Location/Drains/Wounds Patient Lines/Drains/Airways Status     Active Line/Drains/Airways     Name Placement date Placement time Site Days   Peripheral IV 11/08/23 18 G Left Antecubital 11/08/23  1653  Antecubital  1            Intake/Output Last 24 hours  Intake/Output Summary (Last 24 hours) at 11/09/2023 1314 Last data filed at 11/09/2023 0447 Gross per 24 hour  Intake 50 ml  Output 600 ml  Net -550 ml    Labs/Imaging Results for orders placed or performed during the hospital encounter of 11/08/23 (from the past 48 hours)  CBG monitoring, ED     Status: Abnormal   Collection Time: 11/08/23  3:35 PM  Result Value Ref Range   Glucose-Capillary 112 (H) 70 - 99 mg/dL    Comment: Glucose reference range applies only to samples taken after fasting for at least 8 hours.  Ethanol     Status: Abnormal   Collection Time: 11/08/23  3:37 PM  Result Value Ref Range  Alcohol, Ethyl (B) 271 (H) <10 mg/dL    Comment: (NOTE) Lowest detectable limit for serum alcohol is 10 mg/dL.  For medical purposes only. Performed at [facility], [address] [place], [place] [address]   Protime-INR     Status: None   Collection Time: 11/08/23  3:37 PM  Result Value Ref Range   Prothrombin Time 14.5 11.4 - 15.2 seconds   INR 1.1 0.8 - 1.2    Comment: (NOTE) INR goal varies based on device and disease states. Performed at [facility], [address], [place], [place] [address]   APTT     Status: None   Collection Time: 11/08/23  3:37 PM  Result Value Ref Range   aPTT 30 24 - 36 seconds    Comment: Performed at [facility], [address] [place], [place] [address]  CBC     Status: Abnormal   Collection Time:  11/08/23  3:37 PM  Result Value Ref Range   WBC 6.4 4.0 - 10.5 K/uL   RBC 4.27 3.87 - 5.11 MIL/uL   Hemoglobin 12.1 12.0 - 15.0 g/dL   HCT 61.0 63.9 - 53.9 %   MCV 91.1 80.0 - 100.0 fL   MCH 28.3 26.0 - 34.0 pg   MCHC 31.1 30.0 - 36.0 g/dL   RDW 84.1 (H) 88.4 - 84.4 %   Platelets 166 150 - 400 K/uL   nRBC 0.0 0.0 - 0.2 %    Comment: Performed at [facility], [address] [place], [place] [address]  Differential     Status: Abnormal   Collection Time: 11/08/23  3:37 PM  Result Value Ref Range   Neutrophils Relative % 24 %   Neutro Abs 1.6 (L) 1.7 - 7.7 K/uL   Lymphocytes Relative 65 %   Lymphs Abs 4.1 (H) 0.7 - 4.0 K/uL   Monocytes Relative 10 %   Monocytes Absolute 0.6 0.1 - 1.0 K/uL   Eosinophils Relative 1 %   Eosinophils Absolute 0.1 0.0 - 0.5 K/uL   Basophils Relative 0 %   Basophils Absolute 0.0 0.0 - 0.1 K/uL   Immature Granulocytes 0 %   Abs Immature Granulocytes 0.02 0.00 - 0.07 K/uL    Comment: Performed at [facility], [address], [place], [place] [address]  Comprehensive metabolic panel     Status: Abnormal   Collection Time: 11/08/23  3:37 PM  Result Value Ref Range   Sodium 144 135 - 145 mmol/L   Potassium 3.5 3.5 - 5.1 mmol/L   Chloride 108 98 - 111 mmol/L   CO2 19 (L) 22 - 32 mmol/L   Glucose, Bld 118 (H) 70 - 99 mg/dL    Comment: Glucose reference range applies only to samples taken after fasting for at least 8 hours.   BUN 25 (H) 8 - 23 mg/dL   Creatinine, Ser 8.20 (H) 0.44 - 1.00 mg/dL   Calcium  9.5 8.9 - 10.3 mg/dL   Total Protein 7.2 6.5 - 8.1 g/dL   Albumin 4.1 3.5 - 5.0 g/dL   AST 30 15 - 41 U/L   ALT 27 0 - 44 U/L   Alkaline Phosphatase 63 38 - 126 U/L   Total Bilirubin 0.6 0.0 - 1.2 mg/dL   GFR, Estimated 29 (L) >60 mL/min    Comment: (NOTE) Calculated using the CKD-EPI Creatinine Equation (2021)    Anion gap 17 (H) 5 - 15    Comment:  Performed at [facility], [address], [place], [place] [address]  I-stat  chem 8, ED     Status: Abnormal   Collection Time: 11/08/23  3:40 PM  Result Value Ref Range   Sodium 148 (H) 135 - 145 mmol/L   Potassium 3.5 3.5 - 5.1 mmol/L   Chloride 115 (H) 98 - 111 mmol/L   BUN 27 (H) 8 - 23 mg/dL   Creatinine, Ser 7.89 (H) 0.44 - 1.00 mg/dL   Glucose, Bld 883 (H) 70 - 99 mg/dL    Comment: Glucose reference range applies only to samples taken after fasting for at least 8 hours.   Calcium , Ion 1.14 (L) 1.15 - 1.40 mmol/L   TCO2 21 (L) 22 - 32 mmol/L   Hemoglobin 13.6 12.0 - 15.0 g/dL   HCT 59.9 63.9 - 53.9 %  Urine rapid drug screen (hosp performed)     Status: None   Collection Time: 11/08/23  5:30 PM  Result Value Ref Range   Opiates NONE DETECTED NONE DETECTED   Cocaine NONE DETECTED NONE DETECTED   Benzodiazepines NONE DETECTED NONE DETECTED   Amphetamines NONE DETECTED NONE DETECTED   Tetrahydrocannabinol NONE DETECTED NONE DETECTED   Barbiturates NONE DETECTED NONE DETECTED    Comment: (NOTE) DRUG SCREEN FOR MEDICAL PURPOSES ONLY.  IF CONFIRMATION IS NEEDED FOR ANY PURPOSE, NOTIFY LAB WITHIN 5 DAYS.  LOWEST DETECTABLE LIMITS FOR URINE DRUG SCREEN Drug Class                     Cutoff (ng/mL) Amphetamine and metabolites    1000 Barbiturate and metabolites    200 Benzodiazepine                 200 Opiates and metabolites        300 Cocaine and metabolites        300 THC                            50 Performed at [facility] [place] [facility], [address], [place], [place] [address]   Urinalysis, Routine w reflex microscopic -Urine, Clean Catch     Status: Abnormal   Collection Time: 11/08/23  5:30 PM  Result Value Ref Range   Color, Urine STRAW (A) YELLOW   APPearance CLEAR CLEAR   Specific Gravity, Urine 1.024 1.005 - 1.030   pH 5.0 5.0 - 8.0   Glucose, UA NEGATIVE NEGATIVE mg/dL   Hgb urine dipstick SMALL (A) NEGATIVE   Bilirubin Urine NEGATIVE NEGATIVE   Ketones, ur NEGATIVE NEGATIVE mg/dL   Protein, ur 899 (A) NEGATIVE mg/dL   Nitrite  NEGATIVE NEGATIVE   Leukocytes,Ua NEGATIVE NEGATIVE   RBC / HPF 0-5 0 - 5 RBC/hpf   WBC, UA 0-5 0 - 5 WBC/hpf   Bacteria, UA RARE (A) NONE SEEN   Squamous Epithelial / HPF 0-5 0 - 5 /HPF   Mucus PRESENT    Hyaline Casts, UA PRESENT     Comment: Performed at [facility], [address], [place], [place] [address]  Troponin I (High Sensitivity)     Status: None   Collection Time: 11/08/23  8:03 PM  Result Value Ref Range   Troponin I (High Sensitivity) 10 <18 ng/L    Comment: (NOTE) Elevated high sensitivity troponin I (hsTnI) values and significant  changes across serial measurements may suggest ACS but many other  chronic and acute  conditions are known to elevate hsTnI results.  Refer to the Links section for chest pain algorithms and additional  guidance. Performed at [facility], [address] [place], [place] [address]   Procalcitonin     Status: None   Collection Time: 11/08/23  8:03 PM  Result Value Ref Range   Procalcitonin <0.10 ng/mL    Comment:        Interpretation: PCT (Procalcitonin) <= 0.5 ng/mL: Systemic infection (sepsis) is not likely. Local bacterial infection is possible. (NOTE)       Sepsis PCT Algorithm           Lower Respiratory Tract                                      Infection PCT Algorithm    ----------------------------     ----------------------------         PCT < 0.25 ng/mL                PCT < 0.10 ng/mL          Strongly encourage             Strongly discourage   discontinuation of antibiotics    initiation of antibiotics    ----------------------------     -----------------------------       PCT 0.25 - 0.50 ng/mL            PCT 0.10 - 0.25 ng/mL               OR       >80% decrease in PCT            Discourage initiation of                                            antibiotics      Encourage discontinuation           of antibiotics    ----------------------------     -----------------------------         PCT >= 0.50  ng/mL              PCT 0.26 - 0.50 ng/mL               AND        <80% decrease in PCT             Encourage initiation of                                             antibiotics       Encourage continuation           of antibiotics    ----------------------------     -----------------------------        PCT >= 0.50 ng/mL                  PCT > 0.50 ng/mL               AND         increase in PCT                  Strongly encourage  initiation of antibiotics    Strongly encourage escalation           of antibiotics                                     -----------------------------                                           PCT <= 0.25 ng/mL                                                 OR                                        > 80% decrease in PCT                                      Discontinue / Do not initiate                                             antibiotics  Performed at [facility], [address], [place], [place] [address]   Phosphorus     Status: None   Collection Time: 11/08/23  8:03 PM  Result Value Ref Range   Phosphorus 2.9 2.5 - 4.6 mg/dL    Comment: Performed at [facility], [address], [place], [place] [address]  Magnesium      Status: Abnormal   Collection Time: 11/08/23  8:03 PM  Result Value Ref Range   Magnesium  1.6 (L) 1.7 - 2.4 mg/dL    Comment: Performed at [facility], [address], [place], [place] [address]  CK     Status: None   Collection Time: 11/08/23  8:03 PM  Result Value Ref Range   Total CK 72 38 - 234 U/L    Comment: Performed at [facility], [address], [place], [place] [address]  Basic metabolic panel     Status: Abnormal   Collection Time: 11/08/23  8:03 PM  Result Value Ref Range   Sodium 146 (H) 135 - 145 mmol/L   Potassium 3.5 3.5 - 5.1 mmol/L   Chloride 116 (H) 98 - 111 mmol/L   CO2 20 (L) 22 - 32 mmol/L   Glucose, Bld 85 70 - 99 mg/dL    Comment:  Glucose reference range applies only to samples taken after fasting for at least 8 hours.   BUN 21 8 - 23 mg/dL   Creatinine, Ser 8.59 (H) 0.44 - 1.00 mg/dL   Calcium  8.7 (L) 8.9 - 10.3 mg/dL   GFR, Estimated 39 (L) >60 mL/min    Comment: (NOTE) Calculated using the CKD-EPI Creatinine Equation (2021)    Anion gap 10 5 - 15    Comment: Performed at [facility], [address] [place], [place] [address]  TSH     Status: None   Collection Time: 11/08/23  8:03 PM  Result Value Ref Range   TSH 0.472 0.350 - 4.500 uIU/mL    Comment: Performed by a 3rd Generation assay with a functional sensitivity of <=0.01 uIU/mL. Performed at [facility], [address] [place], [place] [address]   Troponin I (High Sensitivity)     Status: None   Collection Time: 11/08/23 10:03 PM  Result Value Ref Range   Troponin I (High Sensitivity) 10 <18 ng/L    Comment: (NOTE) Elevated high sensitivity troponin I (hsTnI) values and significant  changes across serial measurements may suggest ACS but many other  chronic and acute conditions are known to elevate hsTnI results.  Refer to the Links section for chest pain algorithms and additional  guidance. Performed at [facility], [address] [place], [place] [address]   Prealbumin     Status: Abnormal   Collection Time: 11/09/23  4:12 AM  Result Value Ref Range   Prealbumin 48 (H) 18 - 38 mg/dL    Comment: Performed at [facility], [address] [place], [place] [address]  Magnesium      Status: None   Collection Time: 11/09/23  4:12 AM  Result Value Ref Range   Magnesium  1.9 1.7 - 2.4 mg/dL    Comment: Performed at [facility], [address] [place], [place] [address]  Phosphorus     Status: None   Collection Time: 11/09/23  4:12 AM  Result Value Ref Range   Phosphorus 2.5 2.5 - 4.6 mg/dL    Comment: Performed at [facility], [address] [place], [place] [address]  Comprehensive metabolic panel      Status: Abnormal   Collection Time: 11/09/23  4:12 AM  Result Value Ref Range   Sodium 144 135 - 145 mmol/L   Potassium 3.7 3.5 - 5.1 mmol/L   Chloride 112 (H) 98 - 111 mmol/L   CO2 19 (L) 22 - 32 mmol/L   Glucose, Bld 93 70 - 99 mg/dL    Comment: Glucose reference range applies only to samples taken after fasting for at least 8 hours.   BUN 18 8 - 23 mg/dL   Creatinine, Ser 8.77 (H) 0.44 - 1.00 mg/dL   Calcium  8.7 (L) 8.9 - 10.3 mg/dL   Total Protein 6.3 (L) 6.5 - 8.1 g/dL   Albumin 3.6 3.5 - 5.0 g/dL   AST 27 15 - 41 U/L   ALT 21 0 - 44 U/L   Alkaline Phosphatase 58 38 - 126 U/L   Total Bilirubin 0.6 0.0 - 1.2 mg/dL   GFR, Estimated 46 (L) >60 mL/min    Comment: (NOTE) Calculated using the CKD-EPI Creatinine Equation (2021)    Anion gap 13 5 - 15    Comment: Performed at [facility], [address] [place], [place] [address]  CBC     Status: Abnormal   Collection Time: 11/09/23  4:12 AM  Result Value Ref Range   WBC 4.9 4.0 - 10.5 K/uL   RBC 3.87 3.87 - 5.11 MIL/uL   Hemoglobin 11.1 (L) 12.0 - 15.0 g/dL   HCT 64.5 (L) 63.9 - 53.9 %   MCV 91.5 80.0 - 100.0 fL   MCH 28.7 26.0 - 34.0 pg   MCHC 31.4 30.0 - 36.0 g/dL   RDW 84.0 (H) 88.4 - 84.4 %   Platelets 170 150 - 400  K/uL   nRBC 0.0 0.0 - 0.2 %    Comment: Performed at [facility], [address], [place], [place] [address]   EEG adult Result Date: 11/09/2023 Shelton Arlin KIDD, MD     11/09/2023  9:17 AM Patient Name: KERRINGTON SOVA MRN: [id number] Epilepsy Attending: Arlin KIDD Shelton Referring Physician/Provider: Khaliqdina, Salman, MD Date: 11/08/2023 Duration: 22.42 mins Patient history: 78 y.o. female wioth PMH of a. Fib (on eliquis ), HTN, HLD, CKD 3 who presented to [facility] ED as a code stroke for right side weakness. EEG to evaluate for seizure Level of alertness: Awake AEDs during EEG study: None Technical aspects: This EEG study was done with scalp electrodes positioned according to the 10-20 International system  of electrode placement. Electrical activity was reviewed with band pass filter of 1-70Hz , sensitivity of 7 uV/mm, display speed of 57mm/sec with a 60Hz  notched filter applied as appropriate. EEG data were recorded continuously and digitally stored.  Video monitoring was available and reviewed as appropriate. Description: The posterior dominant rhythm consists of 8 Hz activity of moderate voltage (25-35 uV) seen predominantly in posterior head regions, symmetric and reactive to eye opening and eye closing. Hyperventilation and photic stimulation were not performed.   IMPRESSION: This study is within normal limits. No seizures or epileptiform discharges were seen throughout the recording. A normal interictal EEG does not exclude the diagnosis of epilepsy. Arlin KIDD Shelton   DG Abd 1 View Result Date: 11/08/2023 CLINICAL DATA:  Constipation EXAM: ABDOMEN - 1 VIEW COMPARISON:  None Available. FINDINGS: Scattered large and small bowel gas is noted. No obstructive changes are seen. No findings to suggest constipation are noted. No free air is seen. No bony abnormality is noted. IMPRESSION: No acute abnormality noted. Electronically Signed   By: Oneil Devonshire M.D.   On: 11/08/2023 23:27   DG CHEST PORT 1 VIEW Result Date: 11/08/2023 CLINICAL DATA:  Weakness EXAM: PORTABLE CHEST 1 VIEW COMPARISON:  01/13/2022 FINDINGS: Cardiac shadow is mildly enlarged but stable. Aortic calcifications are seen. The lungs are well aerated bilaterally. No focal infiltrate or effusion is seen. No bony abnormality is noted. IMPRESSION: No active disease. Electronically Signed   By: Oneil Devonshire M.D.   On: 11/08/2023 23:27   CT ANGIO HEAD NECK W WO CM (CODE STROKE) Result Date: 11/08/2023 CLINICAL DATA:  Neuro deficit, acute, stroke suspected. Right-sided weakness. EXAM: CT ANGIOGRAPHY HEAD AND NECK WITH AND WITHOUT CONTRAST TECHNIQUE: Multidetector CT imaging of the head and neck was performed using the standard protocol during bolus  administration of intravenous contrast. Multiplanar CT image reconstructions and MIPs were obtained to evaluate the vascular anatomy. Carotid stenosis measurements (when applicable) are obtained utilizing NASCET criteria, using the distal internal carotid diameter as the denominator. RADIATION DOSE REDUCTION: This exam was performed according to the departmental dose-optimization program which includes automated exposure control, adjustment of the mA and/or kV according to patient size and/or use of iterative reconstruction technique. CONTRAST:  75mL OMNIPAQUE  IOHEXOL  350 MG/ML SOLN COMPARISON:  MRA head and neck 10/21/2019. CT thoracic spine 09/12/2020. FINDINGS: CTA NECK FINDINGS Aortic arch: Standard branching with mild atherosclerotic calcification. No significant stenosis of the arch vessel origins. Right carotid system: Patent with a small amount of calcified plaque at the carotid bifurcation. No evidence of a significant stenosis or dissection. Left carotid system: Patent with a small amount mixed calcified and soft plaque at the carotid bifurcation. No evidence of a significant stenosis or dissection. Vertebral arteries: Patent without evidence  of a significant stenosis or dissection. Codominant. Skeleton: Moderate cervical spondylosis. Other neck: No evidence of cervical lymphadenopathy or mass. Upper chest: 4 mm nodule in the posterior left upper lobe abutting the major fissure, unchanged from 09/12/2020 and considered benign with no follow-up imaging recommended. Review of the MIP images confirms the above findings CTA HEAD FINDINGS Anterior circulation: The internal carotid arteries are patent from skull base to carotid termini with calcified plaque resulting in a moderate to severe stenosis of the left cavernous segment. There is interval age indeterminate occlusion of the non-dominant left A1 segment with a residual opacified stump at the left A1-A2 junction. The left A2 segment is supplied by a robust  anterior communicating artery, and the proximal right ACA is widely patent. Both MCAs are patent without evidence of a proximal branch occlusion. There are moderate to severe proximal right M1 and proximal right M2 stenoses. No aneurysm is identified. Posterior circulation: The intracranial vertebral arteries are widely patent to the basilar. Patent PICA, AICA, and SCA origins are visualized bilaterally. The basilar artery is widely patent. There are small posterior communicating arteries bilaterally. Both PCAs are patent without evidence of a flow limiting proximal stenosis. There is a moderate distal left P2 stenosis. No aneurysm is identified. Venous sinuses: Patent. Anatomic variants: Duplicated right MCA. Review of the MIP images confirms the above findings These results were communicated to Dr. Vanessa at 4:07 pm on 11/08/2023 by text page via the [facility] messaging system. IMPRESSION: 1. No definite emergent large vessel occlusion. 2. 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 3. 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 4. Moderate to severe proximal right M1 and M2 stenoses. 5. Right lead patent cervical carotid and vertebral arteries. 6.  Aortic Atherosclerosis (ICD10-I70.0). Electronically Signed   By: Dasie Hamburg M.D.   On: 11/08/2023 16:24   CT HEAD CODE STROKE WO CONTRAST Result Date: 11/08/2023 CLINICAL DATA:  Code stroke. Neuro deficit, acute, stroke suspected. Right-sided weakness. EXAM: CT HEAD WITHOUT CONTRAST TECHNIQUE: Contiguous axial images were obtained from the base of the skull through the vertex without intravenous contrast. RADIATION DOSE REDUCTION: This exam was performed according to the departmental dose-optimization program which includes automated exposure control, adjustment of the mA and/or kV according to patient size and/or use of iterative  reconstruction technique. COMPARISON:  Head CT 01/13/2022.  MRI brain 10/31/2021. FINDINGS: Brain: No acute intracranial hemorrhage. Stable background of mild chronic small-vessel disease. Gray-white differentiation is preserved. No hydrocephalus or extra-axial collection. No mass effect or midline shift. Vascular: No hyperdense vessel or unexpected calcification. Skull: No calvarial fracture or suspicious bone lesion. Skull base is unremarkable. Sinuses/Orbits: No acute finding. Other: None. ASPECTS [facility] Stroke Program Early CT Score) - Ganglionic level infarction (caudate, lentiform nuclei, internal capsule, insula, M1-M3 cortex): 7 - Supraganglionic infarction (M4-M6 cortex): 3 Total score (0-10 with 10 being normal): 10 IMPRESSION: 1. No acute intracranial hemorrhage or evidence of acute large vessel territory infarct. ASPECT score is 10. 2. Stable background of mild chronic small-vessel disease. Electronically Signed   By: Ryan Chess M.D.   On: 11/08/2023 15:52    Pending Labs Unresulted Labs (From admission, onward)    None       Vitals/Pain Today's Vitals   11/09/23 0857 11/09/23 0900 11/09/23 1000 11/09/23 1015  BP:  (!) 147/79 (!) 128/58   Pulse: 83 82 63 81  Resp:  15 19 (!) 22 18  Temp:      TempSrc:      SpO2: 100% 100% 100% 100%  Weight:      Height:      PainSc:        Isolation Precautions No active isolations  Medications Medications  apixaban  (ELIQUIS ) tablet 5 mg (5 mg Oral Given 11/09/23 0939)  rosuvastatin  (CRESTOR ) tablet 10 mg (10 mg Oral Given 11/09/23 0939)  acetaminophen  (TYLENOL ) tablet 650 mg (has no administration in time range)    Or  acetaminophen  (TYLENOL ) suppository 650 mg (has no administration in time range)  HYDROcodone -acetaminophen  (NORCO/VICODIN) 5-325 MG per tablet 1-2 tablet (has no administration in time range)  ondansetron  (ZOFRAN ) tablet 4 mg (4 mg Oral Given 11/09/23 0942)    Or  ondansetron  (ZOFRAN ) injection 4 mg ( Intravenous See  Alternative 11/09/23 0942)  iohexol  (OMNIPAQUE ) 350 MG/ML injection 75 mL (75 mLs Intravenous Contrast Given 11/08/23 1552)  sodium chloride  0.9 % bolus 1,000 mL (0 mLs Intravenous Stopped 11/08/23 1901)  magnesium  sulfate IVPB 2 g 50 mL (0 g Intravenous Stopped 11/09/23 0123)  potassium chloride  SA (KLOR-CON  M) CR tablet 20 mEq (20 mEq Oral Given 11/08/23 2321)    Mobility walks     Focused Assessments Neuro Assessment Handoff:  Swallow screen pass? Yes  Cardiac Rhythm: Normal sinus rhythm NIH Stroke Scale  Dizziness Present: No Headache Present: No Interval: Shift assessment Level of Consciousness (1a.)   : Alert, keenly responsive LOC Questions (1b. )   : Answers both questions correctly LOC Commands (1c. )   : Performs both tasks correctly Best Gaze (2. )  : Normal Visual (3. )  : No visual loss Facial Palsy (4. )    : Normal symmetrical movements Motor Arm, Left (5a. )   : No drift Motor Arm, Right (5b. ) : No drift Motor Leg, Left (6a. )  : No drift Motor Leg, Right (6b. ) : No drift Limb Ataxia (7. ): Absent Sensory (8. )  : Normal, no sensory loss Best Language (9. )  : No aphasia Dysarthria (10. ): Normal Extinction/Inattention (11.)   : No Abnormality Complete NIHSS TOTAL: 0 Last date known well: 11/08/23 Last time known well: 1200 Neuro Assessment: Within Defined Limits Neuro Checks:   Initial (11/08/23 1600)  Has TPA been given? No If patient is a Neuro Trauma and patient is going to OR before floor call report to 4N Charge nurse: [phone] or [phone]   R Recommendations: See Admitting Provider Note  Report given to:   Additional Notes:

## 2023-11-09 NOTE — Evaluation (Signed)
 Occupational Therapy Evaluation Patient Details Name: Christine Hogan MRN: [id number] DOB: [birth year] Today's Date: 11/09/2023   History of Present Illness Christine Hogan is a 78 y.o. female admitted after syncopal episode of unkown etiology.  PMH: A-fib, CKD, diastolic CHF, hyperlipidemia, hypertension.   Clinical Impression   Pt currently at modified independent to independent level for selfcare tasks and functional mobility.  BP in sitting at 153/89 with HR at 75 BPM and O2 at 100%.  Pt uses a cane occasionally at home but mostly outside of the house and she lives with her son and daughter who can assist if needed.  Feel pt is at her normal baseline functionally and does not warrant any further OT needs at this time.               Equipment Recommendations  None recommended by OT       Precautions / Restrictions Precautions Precautions: Fall Restrictions Weight Bearing Restrictions Per Provider Order: No      Mobility Bed Mobility Overal bed mobility: Modified Independent                  Transfers Overall transfer level: Modified independent Equipment used: None                      Balance Overall balance assessment: Mild deficits observed, not formally tested                                         ADL either performed or assessed with clinical judgement   ADL Overall ADL's : Modified independent                                       General ADL Comments: Pt modified independent for simulated selfcare tasks and functional transfers to and from the toilet in the hallway.  She reports using her cane outside the house but not usually in the house.  Right knee pain has been persistent and is sometimes worse, which results in more cane use.  This session she walked without difficulty at a slow pace in the hallway without LOB, completing toilet transfer as well.  No further acute OT needs with vitals stable.  BP in sitting at  153/89, HR at 75 BPM, and O2 at 100% on room air.     Vision Baseline Vision/History: 0 No visual deficits Ability to See in Adequate Light: 0 Adequate Patient Visual Report: No change from baseline Vision Assessment?: Wears glasses for reading     Perception Perception: Within Functional Limits       Praxis Praxis: WFL       Pertinent Vitals/Pain Pain Assessment Pain Assessment: No/denies pain     Extremity/Trunk Assessment Upper Extremity Assessment Upper Extremity Assessment: Overall WFL for tasks assessed (tremors noted in BUEs)   Lower Extremity Assessment Lower Extremity Assessment: Overall WFL for tasks assessed   Cervical / Trunk Assessment Cervical / Trunk Assessment: Normal   Communication Communication Communication: No apparent difficulties   Cognition Arousal: Alert Behavior During Therapy: WFL for tasks assessed/performed Overall Cognitive Status: Within Functional Limits for tasks assessed  Home Living Family/patient expects to be discharged to:: Private residence Living Arrangements: Children (lives with son and daughter) Available Help at Discharge: Available 24 hours/day Type of Home: House Home Access: Stairs to enter Secretary/administrator of Steps: 2 Entrance Stairs-Rails: None (holds onto a column on the porch or rail) Home Layout: One level     Bathroom Shower/Tub: Tub/shower unit;Door   [employer] Toilet: Handicapped height     Home Equipment: Cane - single point   Additional Comments: Drives      Prior Functioning/Environment Prior Level of Function : Independent/Modified Independent             Mobility Comments: Uses cane outside of the house ADLs Comments: Pt stands up and showers         AM-PAC OT 6 Clicks Daily Activity     Outcome Measure Help from another person eating meals?: None Help from another person taking care of personal grooming?:  None Help from another person toileting, which includes using toliet, bedpan, or urinal?: None Help from another person bathing (including washing, rinsing, drying)?: None Help from another person to put on and taking off regular upper body clothing?: None Help from another person to put on and taking off regular lower body clothing?: None 6 Click Score: 24   End of Session Equipment Utilized During Treatment: Gait belt Nurse Communication: Mobility status  Activity Tolerance: Patient tolerated treatment well Patient left: in bed                   Time: 1012-1033 OT Time Calculation (min): 21 min Charges:  OT General Charges $OT Visit: 1 Visit OT Evaluation $OT Eval Low Complexity: 1 Low  Lynwood Constant, OTR/L Acute Rehabilitation Services  Office [phone] 11/09/2023

## 2023-11-09 NOTE — Progress Notes (Signed)
  Echocardiogram 2D Echocardiogram has been performed.  Delcie Roch 11/09/2023, 8:58 AM

## 2023-11-09 NOTE — Progress Notes (Signed)
 PT Cancellation Note  Patient Details Name: Christine Hogan MRN: [id number] DOB: Jul 27, 1946   Cancelled Treatment:    Reason Eval/Treat Not Completed: PT screened, no needs identified, will sign off (Per OT, pt is ambulating well and has no acute PT needs. Will sign off.)   Yassmine Tamm 11/09/2023, 10:46 AM

## 2023-11-09 NOTE — ED Notes (Signed)
 Dr. Allena Katz at bedside

## 2023-11-09 NOTE — ED Notes (Signed)
 Pt transported to vascular.

## 2023-11-09 NOTE — Progress Notes (Signed)
 CSW added substance abuse resources to patient's AVS.  Edwin Dada, MSW, LCSW Transitions of Care  Clinical Social Worker II [phone]

## 2023-11-28 ENCOUNTER — Other Ambulatory Visit (HOSPITAL_COMMUNITY): Payer: 59

## 2023-12-13 ENCOUNTER — Other Ambulatory Visit: Payer: Self-pay | Admitting: Family Medicine

## 2023-12-13 NOTE — Telephone Encounter (Signed)
 Requested by interface surescripts. Medication discontinued 11/09/23.  Requested Prescriptions  Refused Prescriptions Disp Refills   amLODipine  (NORVASC ) 10 MG tablet [Pharmacy Med Name: amLODIPine  Besylate 10 MG Oral Tablet] 100 tablet 2    Sig: TAKE 1 TABLET BY MOUTH DAILY     Cardiovascular: Calcium  Channel Blockers 2 Failed - 12/13/2023 12:32 PM      Failed - Last BP in normal range    BP Readings from Last 1 Encounters:  11/09/23 (!) 144/66         Failed - Valid encounter within last 6 months    Recent Outpatient Visits           1 year ago Acute swimmer's ear of right side   [facility] Pickard, Butler DASEN, MD   1 year ago Generalized abdominal pain   [facility] Duanne, Butler DASEN, MD   1 year ago Acute pain of right knee   [facility] Duanne Butler DASEN, MD   2 years ago Dermatitis   [facility] Chandra Harlene LABOR, NP   2 years ago Syncope, unspecified syncope type   [facility] Pickard, Butler DASEN, MD              Passed - Last Heart Rate in normal range    Pulse Readings from Last 1 Encounters:  11/09/23 77

## 2023-12-14 ENCOUNTER — Inpatient Hospital Stay: Admission: RE | Admit: 2023-12-14 | Payer: 59 | Source: Ambulatory Visit

## 2023-12-14 ENCOUNTER — Ambulatory Visit: Payer: 59

## 2023-12-19 ENCOUNTER — Other Ambulatory Visit (HOSPITAL_COMMUNITY): Payer: 59

## 2023-12-20 ENCOUNTER — Ambulatory Visit (HOSPITAL_COMMUNITY): Payer: 59

## 2024-01-08 ENCOUNTER — Ambulatory Visit (HOSPITAL_COMMUNITY): Payer: 59 | Attending: Cardiovascular Disease

## 2024-01-08 DIAGNOSIS — R011 Cardiac murmur, unspecified: Secondary | ICD-10-CM | POA: Insufficient documentation

## 2024-01-08 LAB — ECHOCARDIOGRAM COMPLETE
Area-P 1/2: 4.77 cm2
MV M vel: 5.34 m/s
MV Peak grad: 114.1 mmHg
Radius: 0.8 cm
S' Lateral: 2.8 cm

## 2024-01-28 ENCOUNTER — Telehealth: Payer: Self-pay | Admitting: Cardiovascular Disease

## 2024-01-28 DIAGNOSIS — I517 Cardiomegaly: Secondary | ICD-10-CM

## 2024-01-28 DIAGNOSIS — I5189 Other ill-defined heart diseases: Secondary | ICD-10-CM

## 2024-01-28 NOTE — Telephone Encounter (Signed)
 Mihai Croitoru, MD 01/08/2024  4:48 PM EST     Heart pumping strength is normal, but the heart muscle is thickened which often makes it stiffer.  This is most commonly seen with insufficiently treated high blood pressure.  A consequence of the thicker and and stiffer muscle and the bottom chamber is that the left atrium (the upper chamber of the heart) is severely dilated, which makes it very likely that atrial fibrillation will recur, but at this time she is in sinus rhythm. The report described her mitral inflow as being "restrictive" but I think I disagree.  It seems to be that the E/A ratio is very severely increased due to the mechanical failure of atrial contraction. 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  Would recommend getting a technetium 79m pyrophosphate scan (PYP scan)   Please order the PYP scan   Spoke with patient about results and further work up needed. Agreeable with plan.  Test ordered - attestation pended to MD  Routed to Thea Alken for scheduling

## 2024-01-28 NOTE — Telephone Encounter (Signed)
 Thanks, scan is not urgent

## 2024-01-28 NOTE — Telephone Encounter (Signed)
 Pt returning nurses call from Friday regarding Echo results. Please advise

## 2024-02-02 ENCOUNTER — Other Ambulatory Visit: Payer: Self-pay

## 2024-02-02 ENCOUNTER — Emergency Department (HOSPITAL_COMMUNITY)
Admission: EM | Admit: 2024-02-02 | Discharge: 2024-02-02 | Disposition: A | Discharge status: Home / Self Care | Attending: Emergency Medicine | Admitting: Emergency Medicine

## 2024-02-02 ENCOUNTER — Emergency Department (HOSPITAL_COMMUNITY)

## 2024-02-02 DIAGNOSIS — R509 Fever, unspecified: Secondary | ICD-10-CM | POA: Insufficient documentation

## 2024-02-02 DIAGNOSIS — I10 Essential (primary) hypertension: Secondary | ICD-10-CM | POA: Diagnosis not present

## 2024-02-02 DIAGNOSIS — K7689 Other specified diseases of liver: Secondary | ICD-10-CM | POA: Diagnosis not present

## 2024-02-02 DIAGNOSIS — R109 Unspecified abdominal pain: Secondary | ICD-10-CM | POA: Diagnosis present

## 2024-02-02 DIAGNOSIS — Z7901 Long term (current) use of anticoagulants: Secondary | ICD-10-CM | POA: Diagnosis not present

## 2024-02-02 DIAGNOSIS — R000 Tachycardia, unspecified: Secondary | ICD-10-CM | POA: Diagnosis not present

## 2024-02-02 DIAGNOSIS — R112 Nausea with vomiting, unspecified: Secondary | ICD-10-CM

## 2024-02-02 DIAGNOSIS — K449 Diaphragmatic hernia without obstruction or gangrene: Secondary | ICD-10-CM | POA: Insufficient documentation

## 2024-02-02 DIAGNOSIS — N183 Chronic kidney disease, stage 3 unspecified: Secondary | ICD-10-CM | POA: Insufficient documentation

## 2024-02-02 DIAGNOSIS — N281 Cyst of kidney, acquired: Secondary | ICD-10-CM | POA: Diagnosis not present

## 2024-02-02 DIAGNOSIS — R7309 Other abnormal glucose: Secondary | ICD-10-CM | POA: Insufficient documentation

## 2024-02-02 DIAGNOSIS — I129 Hypertensive chronic kidney disease with stage 1 through stage 4 chronic kidney disease, or unspecified chronic kidney disease: Secondary | ICD-10-CM | POA: Diagnosis not present

## 2024-02-02 DIAGNOSIS — K5750 Diverticulosis of both small and large intestine without perforation or abscess without bleeding: Secondary | ICD-10-CM | POA: Diagnosis not present

## 2024-02-02 DIAGNOSIS — R0689 Other abnormalities of breathing: Secondary | ICD-10-CM | POA: Diagnosis not present

## 2024-02-02 LAB — CBC WITH DIFFERENTIAL/PLATELET
Abs Immature Granulocytes: 0.02 10*3/uL (ref 0.00–0.07)
Basophils Absolute: 0 10*3/uL (ref 0.0–0.1)
Basophils Relative: 0 %
Eosinophils Absolute: 0 10*3/uL (ref 0.0–0.5)
Eosinophils Relative: 0 %
HCT: 35.1 % — ABNORMAL LOW (ref 36.0–46.0)
Hemoglobin: 11.2 g/dL — ABNORMAL LOW (ref 12.0–15.0)
Immature Granulocytes: 0 %
Lymphocytes Relative: 15 %
Lymphs Abs: 0.8 10*3/uL (ref 0.7–4.0)
MCH: 29 pg (ref 26.0–34.0)
MCHC: 31.9 g/dL (ref 30.0–36.0)
MCV: 90.9 fL (ref 80.0–100.0)
Monocytes Absolute: 0.3 10*3/uL (ref 0.1–1.0)
Monocytes Relative: 6 %
Neutro Abs: 4.2 10*3/uL (ref 1.7–7.7)
Neutrophils Relative %: 79 %
Platelets: 149 10*3/uL — ABNORMAL LOW (ref 150–400)
RBC: 3.86 MIL/uL — ABNORMAL LOW (ref 3.87–5.11)
RDW: 15.4 % (ref 11.5–15.5)
WBC: 5.4 10*3/uL (ref 4.0–10.5)
nRBC: 0 % (ref 0.0–0.2)

## 2024-02-02 LAB — COMPREHENSIVE METABOLIC PANEL WITH GFR
ALT: 22 U/L (ref 0–44)
AST: 42 U/L — ABNORMAL HIGH (ref 15–41)
Albumin: 3.9 g/dL (ref 3.5–5.0)
Alkaline Phosphatase: 55 U/L (ref 38–126)
Anion gap: 14 (ref 5–15)
BUN: 18 mg/dL (ref 8–23)
CO2: 19 mmol/L — ABNORMAL LOW (ref 22–32)
Calcium: 8.9 mg/dL (ref 8.9–10.3)
Chloride: 106 mmol/L (ref 98–111)
Creatinine, Ser: 1.52 mg/dL — ABNORMAL HIGH (ref 0.44–1.00)
GFR, Estimated: 35 mL/min — ABNORMAL LOW (ref >60)
Glucose, Bld: 164 mg/dL — ABNORMAL HIGH (ref 70–99)
Potassium: 4.1 mmol/L (ref 3.5–5.1)
Sodium: 139 mmol/L (ref 135–145)
Total Bilirubin: 1.2 mg/dL (ref 0.0–1.2)
Total Protein: 6.9 g/dL (ref 6.5–8.1)

## 2024-02-02 LAB — URINALYSIS, ROUTINE W REFLEX MICROSCOPIC
Bilirubin Urine: NEGATIVE
Glucose, UA: 150 mg/dL — AB
Ketones, ur: NEGATIVE mg/dL
Leukocytes,Ua: NEGATIVE
Nitrite: NEGATIVE
Protein, ur: 100 mg/dL — AB
Specific Gravity, Urine: 1.014 (ref 1.005–1.030)
pH: 7 (ref 5.0–8.0)

## 2024-02-02 LAB — TROPONIN I (HIGH SENSITIVITY)
Troponin I (High Sensitivity): 11 ng/L (ref <18)
Troponin I (High Sensitivity): 14 ng/L (ref <18)

## 2024-02-02 LAB — RESP PANEL BY RT-PCR (RSV, FLU A&B, COVID)  RVPGX2
Influenza A by PCR: NEGATIVE
Influenza B by PCR: NEGATIVE
Resp Syncytial Virus by PCR: NEGATIVE
SARS Coronavirus 2 by RT PCR: NEGATIVE

## 2024-02-02 LAB — LIPASE, BLOOD: Lipase: 32 U/L (ref 11–51)

## 2024-02-02 MED ORDER — SODIUM CHLORIDE 0.9 % IV BOLUS
1000.0000 mL | Freq: Once | INTRAVENOUS | Status: AC
  Administered 2024-02-02: 1000 mL via INTRAVENOUS

## 2024-02-02 MED ORDER — ALUM & MAG HYDROXIDE-SIMETH 200-200-20 MG/5ML PO SUSP
30.0000 mL | Freq: Once | ORAL | Status: AC
  Administered 2024-02-02: 30 mL via ORAL
  Filled 2024-02-02: qty 30

## 2024-02-02 MED ORDER — LIDOCAINE VISCOUS HCL 2 % MT SOLN
15.0000 mL | Freq: Once | OROMUCOSAL | Status: AC
  Administered 2024-02-02: 15 mL via ORAL
  Filled 2024-02-02: qty 15

## 2024-02-02 MED ORDER — PANTOPRAZOLE SODIUM 20 MG PO TBEC: 20.0000 mg | DELAYED_RELEASE_TABLET | Freq: Every day | ORAL | 2 refills | Status: DC

## 2024-02-02 MED ORDER — ONDANSETRON HCL 4 MG PO TABS: 4.0000 mg | ORAL_TABLET | Freq: Three times a day (TID) | ORAL | 0 refills | Status: DC | PRN

## 2024-02-02 MED ORDER — ONDANSETRON HCL 4 MG/2ML IJ SOLN
4.0000 mg | Freq: Once | INTRAMUSCULAR | Status: AC
  Administered 2024-02-02: 4 mg via INTRAVENOUS
  Filled 2024-02-02: qty 2

## 2024-02-02 MED ORDER — FENTANYL CITRATE PF 50 MCG/ML IJ SOSY
50.0000 ug | PREFILLED_SYRINGE | Freq: Once | INTRAMUSCULAR | Status: AC
  Administered 2024-02-02: 50 ug via INTRAVENOUS
  Filled 2024-02-02: qty 1

## 2024-02-02 NOTE — Discharge Instructions (Addendum)
 Contact a health care provider if: Your symptoms are not controlled with medicines or lifestyle changes. You are having trouble swallowing. You have coughing or wheezing that will not go away. Your pain is getting worse. Your pain spreads to your arms, neck, jaw, teeth, or back. You feel nauseous or you vomit. Get help right away if: You have shortness of breath. You vomit blood. You have bright red blood in your stools. You have black, tarry stools. These symptoms may be an emergency. Get help right away. Call 911. Do not wait to see if the symptoms will go away. Do not drive yourself to the hospital.

## 2024-02-02 NOTE — ED Triage Notes (Signed)
 Per EMS from home. Sudden onset chills, n/v.   BP 160/80 RR CBG 189 99 on RA HR 110

## 2024-02-02 NOTE — ED Provider Notes (Signed)
 [place] EMERGENCY DEPARTMENT AT [facility] Provider Note   CSN: [id number] Arrival date & time: 02/02/24  5784     History  Chief Complaint  Patient presents with   Fever   Nausea   Emesis    Christine Hogan is a 78 y.o. female who  has a past medical history of Arthritis, Atrial fibrillation (HCC), Chest pain, CKD (chronic kidney disease) stage 3, GFR 30-59 ml/min (HCC), Diastolic dysfunction, H/O echocardiogram, Hyperlipidemia, Hypertension, Near syncope, and Osteopenia. Presents with emesis and abdominal pain. SUdden onset at 9:00 am . Woke her from sleep. 3 episodes of NBNB vomitous. 6 out of 10 gripping periumbilical and epigastric aching pain that does not radiate. Patient reports that she was covered in diaphoresis and shaking, so she believes whe could have a fever. No hx of similar sxs. Pos hx of abdominal hysterectomy and hernia repair. Has not made a BM or passed gas yet. She denies recent foreign travel, sick contacts, suspicous food intake or abdominal distention.   Fever Associated symptoms: vomiting   Emesis Associated symptoms: fever        Home Medications Prior to Admission medications   Medication Sig Start Date End Date Taking? Authorizing Provider  ondansetron (ZOFRAN) 4 MG tablet Take 1 tablet (4 mg total) by mouth every 8 (eight) hours as needed for nausea or vomiting. 02/02/24  Yes Arul Farabee, PA-C  pantoprazole (PROTONIX) 20 MG tablet Take 1 tablet (20 mg total) by mouth daily. 02/02/24  Yes Arthor Captain, PA-C  Cholecalciferol (VITAMIN D3 PO) Take 1 tablet by mouth daily.    [provider]  diclofenac Sodium (VOLTAREN) 1 % GEL Apply 2 g topically 4 (four) times daily. Patient taking differently: Apply 2 g topically daily as needed (for pain). 05/26/22   Valinda Hoar, NP  ELIQUIS 5 MG TABS tablet TAKE 1 TABLET BY MOUTH TWICE  DAILY 06/04/23   O'Neal, Ronnald Ramp, MD  lidocaine (XYLOCAINE) 5 % ointment Apply 1 Application  topically as needed. Patient taking differently: Apply 1 Application topically daily as needed for mild pain (pain score 1-3). 05/26/22   Valinda Hoar, NP  metoprolol succinate (TOPROL XL) 25 MG 24 hr tablet Take 1 tablet (25 mg total) by mouth daily. 11/09/23 11/08/24  Rolly Salter, MD  Multiple Vitamins-Minerals (CENTRUM SILVER 50+WOMEN) TABS Take 1 tablet by mouth daily with breakfast.    [provider]  Omega-3 Fatty Acids (OMEGA 3 500 PO) Take 1 capsule by mouth daily.    [provider]  rosuvastatin (CRESTOR) 10 MG tablet TAKE 1 TABLET BY MOUTH ONCE  DAILY 09/03/23   Donita Brooks, MD  terbinafine (LAMISIL) 250 MG tablet Take 1 tablet (250 mg total) by mouth daily. 06/18/23   Donita Brooks, MD  vitamin B-12 (CYANOCOBALAMIN) 100 MCG tablet Take 100 mcg by mouth daily.    [provider]      Allergies    Codeine and Codeine    Review of Systems   Review of Systems  Constitutional:  Positive for fever.  Gastrointestinal:  Positive for vomiting.    Physical Exam Updated Vital Signs BP 131/71 (BP Location: Right Arm)   Pulse 86   Temp 98 F (36.7 C) (Oral)   Resp 16   Ht 5\' 5"  (1.651 m)   Wt 74.8 kg   SpO2 100%   BMI 27.46 kg/m  Physical Exam Vitals and nursing note reviewed.  Constitutional:  General: She is not in acute distress.    Appearance: She is well-developed. She is not diaphoretic.  HENT:     Head: Normocephalic and atraumatic.     Right Ear: External ear normal.     Left Ear: External ear normal.     Nose: Nose normal.     Mouth/Throat:     Mouth: Mucous membranes are moist.  Eyes:     General: No scleral icterus.    Conjunctiva/sclera: Conjunctivae normal.  Cardiovascular:     Rate and Rhythm: Normal rate and regular rhythm.     Heart sounds: Normal heart sounds. No murmur heard.    No friction rub. No gallop.  Pulmonary:     Effort: Pulmonary effort is normal. No respiratory distress.     Breath sounds:  Normal breath sounds.  Abdominal:     General: Bowel sounds are normal. There is no distension.     Palpations: Abdomen is soft. There is no mass.     Tenderness: There is abdominal tenderness. There is no guarding.  Musculoskeletal:     Cervical back: Normal range of motion.  Skin:    General: Skin is warm and dry.  Neurological:     Mental Status: She is alert and oriented to person, place, and time.  Psychiatric:        Behavior: Behavior normal.     ED Results / Procedures / Treatments   Labs (all labs ordered are listed, but only abnormal results are displayed) Labs Reviewed  CBC WITH DIFFERENTIAL/PLATELET - Abnormal; Notable for the following components:      Result Value   RBC 3.86 (*)    Hemoglobin 11.2 (*)    HCT 35.1 (*)    Platelets 149 (*)    All other components within normal limits  COMPREHENSIVE METABOLIC PANEL WITH GFR - Abnormal; Notable for the following components:   CO2 19 (*)    Glucose, Bld 164 (*)    Creatinine, Ser 1.52 (*)    AST 42 (*)    GFR, Estimated 35 (*)    All other components within normal limits  URINALYSIS, ROUTINE W REFLEX MICROSCOPIC - Abnormal; Notable for the following components:   Color, Urine STRAW (*)    Glucose, UA 150 (*)    Hgb urine dipstick SMALL (*)    Protein, ur 100 (*)    Bacteria, UA RARE (*)    All other components within normal limits  RESP PANEL BY RT-PCR (RSV, FLU A&B, COVID)  [id number]  LIPASE, BLOOD  TROPONIN I (HIGH SENSITIVITY)  TROPONIN I (HIGH SENSITIVITY)    EKG EKG Interpretation Date/Time:  Saturday February 02 2024 10:02:55 EDT Ventricular Rate:  98 PR Interval:  192 QRS Duration:  90 QT Interval:  366 QTC Calculation: 468 R Axis:   56  Text Interpretation: Sinus rhythm Probable anteroseptal infarct, old Borderline T abnormalities, inferior leads Confirmed by Margarita Grizzle [phone]) on 02/02/2024 10:30:10 AM  Radiology CT ABDOMEN PELVIS WO CONTRAST Result Date: 02/02/2024 CLINICAL DATA:  Acute  generalized abdominal pain. EXAM: CT ABDOMEN AND PELVIS WITHOUT CONTRAST TECHNIQUE: Multidetector CT imaging of the abdomen and pelvis was performed following the standard protocol without IV contrast. RADIATION DOSE REDUCTION: This exam was performed according to the departmental dose-optimization program which includes automated exposure control, adjustment of the mA and/or kV according to patient size and/or use of iterative reconstruction technique. COMPARISON:  January 13, 2022. FINDINGS: Lower chest: Minimal bibasilar subsegmental atelectasis or scarring is noted. Hepatobiliary:  No cholelithiasis or biliary dilatation is noted. Stable right hepatic cysts are noted. Pancreas: Unremarkable. No pancreatic ductal dilatation or surrounding inflammatory changes. Spleen: Normal in size without focal abnormality. Adrenals/Urinary Tract: Adrenal glands appear normal. Stable cyst is seen in upper pole of right kidney for which no further follow-up is required. No hydronephrosis or renal obstruction is noted. Urinary bladder is unremarkable. Stomach/Bowel: Moderate size sliding-type hiatal hernia is noted. The appendix appears normal. 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 There is no evidence of bowel obstruction or inflammation. Diverticulosis of descending and sigmoid colon is noted without inflammation. Vascular/Lymphatic: Aortic atherosclerosis. No enlarged abdominal or pelvic lymph nodes. Reproductive: Status post hysterectomy. No adnexal masses. Other: No ascites or hernia is noted. Musculoskeletal: No acute or significant osseous findings. IMPRESSION: Moderate size sliding-type hiatal hernia. Diverticulosis of descending and sigmoid colon without inflammation. No acute abnormality seen in the abdomen or pelvis. Aortic Atherosclerosis (ICD10-I70.0). Electronically Signed   By: Lupita Raider M.D.   On: 02/02/2024 12:32    Procedures Procedures    Medications Ordered in ED Medications  fentaNYL  (SUBLIMAZE) injection 50 mcg (50 mcg Intravenous Given 02/02/24 1028)  ondansetron (ZOFRAN) injection 4 mg (4 mg Intravenous Given 02/02/24 1028)  sodium chloride 0.9 % bolus 1,000 mL (1,000 mLs Intravenous New Bag/Given 02/02/24 1345)  alum & mag hydroxide-simeth (MAALOX/MYLANTA) 200-200-20 MG/5ML suspension 30 mL (30 mLs Oral Given 02/02/24 1334)    And  lidocaine (XYLOCAINE) 2 % viscous mouth solution 15 mL (15 mLs Oral Given 02/02/24 1334)    ED Course/ Medical Decision Making/ A&P Clinical Course as of 02/02/24 1527  Sat Feb 02, 2024  1153 Resp panel by RT-PCR (RSV, Flu A&B, Covid) Anterior Nasal Swab [AH]  1153 WBC: 5.4 [AH]  1153 Hemoglobin(!): 11.2 Hgb at baseline [AH]  1154 Platelets(!): 149 Slight downtrend in her platelets [AH]  1154 Creatinine(!): 1.52 [AH]  1154 GFR, Estimated(!): 35 Slight dowtrend in calculated GFR  [AH]  1305 Urinalysis, Routine w reflex microscopic -Urine, Clean Catch(!) UA appears negative  [AH]  1306 CT ABDOMEN PELVIS WO CONTRAST I personally visualized and interpreted the images using our PACS system. Acute findings include:  Sliding hiatal hernia- no other acute findings  [AH]  1423 Repeat troponin wnl  [AH]  1423 Glucose(!): 164 [AH]  1424 EKG sinus rhythm rate 98 probable old infarct - abnormal ECG [AH]    Clinical Course User Index [AH] Arthor Captain, PA-C                                 Medical Decision Making Amount and/or Complexity of Data Reviewed Labs: ordered. Decision-making details documented in ED Course. Radiology: ordered. Decision-making details documented in ED Course. ECG/medicine tests: ordered.  Risk OTC drugs. Prescription drug management.   This patient presents to the ED for concern of vomiting and abdominal pain , this involves an extensive number of treatment options, and is a complaint that carries with it a high risk of complications and morbidity.  The differential diagnosis for generalized abdominal pain  includes, but is not limited to AAA, gastroenteritis, appendicitis, Bowel obstruction, Bowel perforation. Gastroparesis, DKA, Hernia, Inflammatory bowel disease, mesenteric ischemia, pancreatitis, peritonitis SBP, volvulus.   Co morbidities:   has a past medical history of Arthritis, Atrial fibrillation (HCC), Chest pain, CKD (chronic kidney disease) stage 3, GFR 30-59 ml/min (HCC), Diastolic dysfunction, H/O echocardiogram, Hyperlipidemia, Hypertension, Near syncope, and Osteopenia.  Social Determinants of Health:    SDOH Screenings   Food Insecurity: No Food Insecurity (11/08/2023)  Housing: Low Risk  (11/08/2023)  Transportation Needs: No Transportation Needs (11/08/2023)  Utilities: Not At Risk (11/08/2023)  Alcohol Screen: Low Risk  (03/22/2023)  Depression (PHQ2-9): Low Risk  (10/25/2023)  Financial Resource Strain: Low Risk  (03/22/2023)  Physical Activity: Insufficiently Active (03/22/2023)  Social Connections: Moderately Isolated (11/08/2023)  Stress: No Stress Concern Present (03/22/2023)  Tobacco Use: Low Risk  (11/08/2023)     Additional history:  {Additional history obtained from EMR   Lab Tests:  I Ordered, and personally interpreted labs.  The pertinent results include: Urine does not appear to be infected.  CMP shows chronic renal insufficiency unchanged. Troponin is negative x 2.  Remainder of labs are unremarkable  Imaging Studies:  I ordered imaging studies including CT Noncon of the abdomen and pelvis I independently visualized and interpreted imaging which showed large sliding hiatal hernia without any other abnormalities. I agree with the radiologist interpretation  Cardiac Monitoring/ECG:  The patient was maintained on a cardiac monitor.  I personally viewed and interpreted the cardiac monitored which showed an underlying rhythm of:  Sinus rhythm Normal EKG, followed in the outpatient setting by cardiology regularly  Medicines ordered and prescription drug  management:  I ordered medication including  Medications  fentaNYL (SUBLIMAZE) injection 50 mcg (50 mcg Intravenous Given 02/02/24 1028)  ondansetron (ZOFRAN) injection 4 mg (4 mg Intravenous Given 02/02/24 1028)  sodium chloride 0.9 % bolus 1,000 mL (1,000 mLs Intravenous New Bag/Given 02/02/24 1345)  alum & mag hydroxide-simeth (MAALOX/MYLANTA) 200-200-20 MG/5ML suspension 30 mL (30 mLs Oral Given 02/02/24 1334)    And  lidocaine (XYLOCAINE) 2 % viscous mouth solution 15 mL (15 mLs Oral Given 02/02/24 1334)   for abdominal pain and vomiting Reevaluation of the patient after these medicines showed that the patient improved I have reviewed the patients home medicines and have made adjustments as needed  Test Considered:   I considered CT scan of the abdomen pelvis with contrast however patient has severe renal insufficiency.  Critical Interventions:   fluids antiemetics. Problem List / ED Course:     ICD-10-CM   1. Nausea and vomiting, unspecified vomiting type  R11.2     2. Hiatal hernia  K44.9       MDM: Patient here with sudden onset of abdominal pain and vomiting.  I suspect this is due to her sliding hiatal hernia.  Patient does report that she drinks too much alcohol regularly however she denies any history of waking up with shakes or needing to take alcohol to stave off shaking.  She did get shaky today after vomiting and being diaphoretic however I do not think this is due to a physiologic dependence on alcohol.  I did discuss the fact that this might make her stomach pain worse.  Patient is not on a PPI.  She is unsure if she seen a GI specialist in the past.  She was aware that she has a hiatal hernia.  Patient will be started on 20 mg of Protonix daily.  She is encouraged to discontinue using alcohol regularly.  I have discussed reasons to seek immediate medical care and reasons to follow-up with PCP as well as given a GI referral.  Patient understands.  Will also give her  Zofran.   Dispostion:  After consideration of the diagnostic results and the patients response to treatment, I feel that the patent would benefit from  discharge with close follow-up and strict return precautions..         Final Clinical Impression(s) / ED Diagnoses Final diagnoses:  Nausea and vomiting, unspecified vomiting type  Hiatal hernia    Rx / DC Orders ED Discharge Orders          Ordered    pantoprazole (PROTONIX) 20 MG tablet  Daily        02/02/24 1523    ondansetron (ZOFRAN) 4 MG tablet  Every 8 hours PRN        02/02/24 1523              Arthor Captain, PA-C 02/02/24 1531    Margarita Grizzle, MD 02/07/24 1149

## 2024-02-07 ENCOUNTER — Ambulatory Visit: Admitting: Family Medicine

## 2024-02-07 VITALS — BP 118/62 | HR 52 | Temp 98.0°F | Ht 65.0 in | Wt 150.2 lb

## 2024-02-07 DIAGNOSIS — R1013 Epigastric pain: Secondary | ICD-10-CM | POA: Diagnosis not present

## 2024-02-07 MED ORDER — SUCRALFATE 1 G PO TABS: 1.0000 g | ORAL_TABLET | Freq: Three times a day (TID) | ORAL | 0 refills | Status: AC

## 2024-02-07 MED ORDER — PANTOPRAZOLE SODIUM 40 MG PO TBEC: 40.0000 mg | DELAYED_RELEASE_TABLET | Freq: Two times a day (BID) | ORAL | 3 refills | Status: DC

## 2024-02-07 NOTE — Progress Notes (Signed)
 Subjective:    Patient ID: Christine Hogan, female    DOB: [birth year], 78 y.o.   MRN: [id number]  Seen in ER  with abdominal pain 02/02/24.  CT revealed: FINDINGS: Lower chest: Minimal bibasilar subsegmental atelectasis or scarring is noted.   Hepatobiliary: No cholelithiasis or biliary dilatation is noted. Stable right hepatic cysts are noted.   Pancreas: Unremarkable. No pancreatic ductal dilatation or surrounding inflammatory changes.   Spleen: Normal in size without focal abnormality.   Adrenals/Urinary Tract: Adrenal glands appear normal. Stable cyst is seen in upper pole of right kidney for which no further follow-up is required. No hydronephrosis or renal obstruction is noted. Urinary bladder is unremarkable.   Stomach/Bowel: Moderate size sliding-type hiatal hernia is noted. The appendix appears normal. 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 There is no evidence of bowel obstruction or inflammation. Diverticulosis of descending and sigmoid colon is noted without inflammation.   Vascular/Lymphatic: Aortic atherosclerosis. No enlarged abdominal or pelvic lymph nodes.   Reproductive: Status post hysterectomy. No adnexal masses.   Other: No ascites or hernia is noted.   Musculoskeletal: No acute or significant osseous findings.   02/07/24 Patient has a history of alcohol abuse.  She states that she has been abstinent from alcohol for 3 days prior to going to the emergency room.  The day she went to the emergency room she did drink a small amount of alcohol.  Shortly thereafter she developed aching epigastric pain.  Saturday morning she woke up with nausea and vomiting and epigastric pain.  She went to the emergency room after having an episode of weakness and shaking which sounds like it may have been a vasovagal event.  She contacted 911.  In the emergency room liver function test were mildly elevated.  Other labs were normal.  CT scan was significant only for a sliding  hiatal hernia. Past Medical History:  Diagnosis Date   Arthritis    Atrial fibrillation (HCC)    Chest pain    a. 2014: NST with no evidence of ischemia   CKD (chronic kidney disease) stage 3, GFR 30-59 ml/min (HCC)    Diastolic dysfunction    grade 2 with dyspnea on exertion   H/O echocardiogram    a. 2014: echo showing EF of 65-70% with Grade 1 DD, moderate TR, and mild to moderate MR   Hyperlipidemia    Hypertension    Near syncope    Osteopenia    Past Surgical History:  Procedure Laterality Date   ABDOMINAL HYSTERECTOMY     TAH/BSO (menorrhagia)   bunion repair     HERNIA REPAIR     Current Outpatient Medications on File Prior to Visit  Medication Sig Dispense Refill   Cholecalciferol (VITAMIN D3 PO) Take 1 tablet by mouth daily.     diclofenac Sodium (VOLTAREN) 1 % GEL Apply 2 g topically 4 (four) times daily. (Patient taking differently: Apply 2 g topically daily as needed (for pain).) 50 g 0   ELIQUIS 5 MG TABS tablet TAKE 1 TABLET BY MOUTH TWICE  DAILY 200 tablet 2   lidocaine (XYLOCAINE) 5 % ointment Apply 1 Application topically as needed. (Patient taking differently: Apply 1 Application topically daily as needed for mild pain (pain score 1-3).) 35.44 g 0   metoprolol succinate (TOPROL XL) 25 MG 24 hr tablet Take 1 tablet (25 mg total) by mouth daily. 30 tablet 0   Multiple Vitamins-Minerals (CENTRUM SILVER 50+WOMEN) TABS Take 1 tablet by mouth daily with breakfast.  Omega-3 Fatty Acids (OMEGA 3 500 PO) Take 1 capsule by mouth daily.     ondansetron (ZOFRAN) 4 MG tablet Take 1 tablet (4 mg total) by mouth every 8 (eight) hours as needed for nausea or vomiting. 10 tablet 0   pantoprazole (PROTONIX) 20 MG tablet Take 1 tablet (20 mg total) by mouth daily. 30 tablet 2   rosuvastatin (CRESTOR) 10 MG tablet TAKE 1 TABLET BY MOUTH ONCE  DAILY 100 tablet 1   terbinafine (LAMISIL) 250 MG tablet Take 1 tablet (250 mg total) by mouth daily. 30 tablet 2   vitamin B-12  (CYANOCOBALAMIN) 100 MCG tablet Take 100 mcg by mouth daily.     No current facility-administered medications on file prior to visit.   Allergies  Allergen Reactions   Codeine Nausea And Vomiting   Codeine Nausea And Vomiting and Other (See Comments)    Dizziness, also   Social History   Socioeconomic History   Marital status: Divorced    Spouse name: Not on file   Number of children: 5   Years of education: 12th grade   Highest education level: Not on file  Occupational History   Occupation: Retired  Tobacco Use   Smoking status: Never   Smokeless tobacco: Never  Vaping Use   Vaping status: Never Used  Substance and Sexual Activity   Alcohol use: Yes    Comment: occasionally   Drug use: No   Sexual activity: Yes  Other Topics Concern   Not on file  Social History Narrative   ** Merged History Encounter **       Lives with her son. Right-handed. Four living children. Occasional caffeine use.   Social Drivers of Corporate investment banker Strain: Low Risk  (03/22/2023)   Overall Financial Resource Strain (CARDIA)    Difficulty of Paying Living Expenses: Not hard at all  Food Insecurity: No Food Insecurity (11/08/2023)   Hunger Vital Sign    Worried About Running Out of Food in the Last Year: Never true    Ran Out of Food in the Last Year: Never true  Transportation Needs: No Transportation Needs (11/08/2023)   PRAPARE - Administrator, Civil Service (Medical): No    Lack of Transportation (Non-Medical): No  Physical Activity: Insufficiently Active (03/22/2023)   Exercise Vital Sign    Days of Exercise per Week: 3 days    Minutes of Exercise per Session: 30 min  Stress: No Stress Concern Present (03/22/2023)   [employer] of Occupational Health - Occupational Stress Questionnaire    Feeling of Stress : Not at all  Social Connections: Moderately Isolated (11/08/2023)   Social Connection and Isolation Panel [NHANES]    Frequency of Communication with  Friends and Family: More than three times a week    Frequency of Social Gatherings with Friends and Family: More than three times a week    Attends Religious Services: More than 4 times per year    Active Member of [employer] or Organizations: No    Attends Banker Meetings: Never    Marital Status: Widowed  Intimate Partner Violence: Not At Risk (11/08/2023)   Humiliation, Afraid, Rape, and Kick questionnaire    Fear of Current or Ex-Partner: No    Emotionally Abused: No    Physically Abused: No    Sexually Abused: No     Review of Systems  Gastrointestinal:  Positive for abdominal pain.  All other systems reviewed and are negative.  Objective:   Physical Exam Vitals reviewed.  Constitutional:      General: She is not in acute distress.    Appearance: Normal appearance. She is normal weight. She is not ill-appearing, toxic-appearing or diaphoretic.  HENT:     Right Ear: Tympanic membrane and ear canal normal.     Left Ear: Tympanic membrane and ear canal normal.     Nose: No congestion or rhinorrhea.     Mouth/Throat:     Mouth: Mucous membranes are moist. No oral lesions.     Pharynx: Oropharynx is clear. Uvula midline. No pharyngeal swelling, oropharyngeal exudate, posterior oropharyngeal erythema or uvula swelling.     Tonsils: No tonsillar exudate.  Eyes:     Extraocular Movements: Extraocular movements intact.     Right eye: Normal extraocular motion.     Left eye: Normal extraocular motion.     Pupils: Pupils are equal, round, and reactive to light.  Neck:     Vascular: No carotid bruit.  Cardiovascular:     Rate and Rhythm: Normal rate and regular rhythm.     Pulses: Normal pulses.     Heart sounds: Normal heart sounds. No murmur heard.    No friction rub. No gallop.  Pulmonary:     Effort: Pulmonary effort is normal. No respiratory distress.     Breath sounds: Normal breath sounds. No stridor. No wheezing, rhonchi or rales.  Abdominal:      General: Abdomen is flat. Bowel sounds are decreased. There is no distension.     Palpations: Abdomen is soft.     Tenderness: There is no abdominal tenderness. There is no guarding or rebound.     Hernia: No hernia is present.  Musculoskeletal:     Cervical back: Neck supple.  Lymphadenopathy:     Cervical: No cervical adenopathy.  Neurological:     General: No focal deficit present.     Mental Status: She is alert and oriented to person, place, and time. Mental status is at baseline.     Cranial Nerves: No cranial nerve deficit.     Sensory: No sensory deficit.     Motor: No weakness.     Coordination: Coordination normal.     Gait: Gait normal.     Deep Tendon Reflexes: Reflexes normal.  Psychiatric:        Mood and Affect: Mood normal.        Behavior: Behavior normal.        Thought Content: Thought content normal.        Judgment: Judgment normal.           Assessment & Plan:  Epigastric abdominal pain Differential diagnosis includes gastritis versus chronic pancreatitis.  I suspect most likely gastritis brought on by alcohol use.  Recommended pantoprazole 40 mg twice daily with sucralfate 1 g p.o. 3 times daily.  Reassess in 1 to 2 weeks.  Patient has reached out to GI to make an appointment.  I feel that this is appropriate as she may require EGD.  Recommended abstinence from alcohol

## 2024-02-09 ENCOUNTER — Other Ambulatory Visit: Payer: Self-pay | Admitting: Family Medicine

## 2024-02-11 NOTE — Telephone Encounter (Signed)
 Requested medications are due for refill today.  yes  Requested medications are on the active medications list.  yes  Last refill. 09/03/2023 #100 1 rf  Future visit scheduled.   yes  Notes to clinic.  Lipid labs are expired.    Requested Prescriptions  Pending Prescriptions Disp Refills   rosuvastatin (CRESTOR) 10 MG tablet [Pharmacy Med Name: Rosuvastatin Calcium 10 MG Oral Tablet] 100 tablet 2    Sig: TAKE 1 TABLET BY MOUTH ONCE  DAILY     Cardiovascular:  Antilipid - Statins 2 Failed - 02/11/2024  4:37 PM      Failed - Cr in normal range and within 360 days    Creat  Date Value Ref Range Status  08/17/2023 1.67 (H) 0.60 - 1.00 mg/dL Final   Creatinine, Ser  Date Value Ref Range Status  02/02/2024 1.52 (H) 0.44 - 1.00 mg/dL Final         Failed - Lipid Panel in normal range within the last 12 months    Cholesterol, Total  Date Value Ref Range Status  07/14/2020 140 100 - 199 mg/dL Final  16/08/9603 540 <981 mg/dL Final   Cholesterol  Date Value Ref Range Status  12/18/2022 142 <200 mg/dL Final   LDL (calc)  Date Value Ref Range Status  07/10/2013 97 <100 mg/dL Final    Comment:    LDL-C is inaccurate if patient is nonfasting.   Reference Range: ---------------- Optimal:            <100 Near/Above Optimal: 100-129 Borderline High:    130-159 High:               160-189 Very High:          >=190       LDL Cholesterol (Calc)  Date Value Ref Range Status  12/18/2022 60 mg/dL (calc) Final    Comment:    Reference range: <100 . Desirable range <100 mg/dL for primary prevention;   <70 mg/dL for patients with CHD or diabetic patients  with > or = 2 CHD risk factors. Marland Kitchen LDL-C is now calculated using the Martin-Hopkins  calculation, which is a validated novel method providing  better accuracy than the Friedewald equation in the  estimation of LDL-C.  Horald Pollen et al. Lenox Ahr. 1914;782(95): 2061-2068  (http://education.[facility].com/faq/FAQ164)     HDL-C  Date Value Ref Range Status  07/10/2013 52 >=40 mg/dL Final   HDL  Date Value Ref Range Status  12/18/2022 52 > OR = 50 mg/dL Final  62/13/0865 48 >78 mg/dL Final   Triglycerides  Date Value Ref Range Status  12/18/2022 241 (H) <150 mg/dL Final    Comment:    . If a non-fasting specimen was collected, consider repeat triglyceride testing on a fasting specimen if clinically indicated.  Perry Mount et al. J. of Clin. Lipidol. 2015;9:129-169. Marland Kitchen   07/10/2013 210 (H) <150 mg/dL Final         Passed - Patient is not pregnant      Passed - Valid encounter within last 12 months    Recent Outpatient Visits           4 days ago Epigastric abdominal pain   [place] [facility] Donita Brooks, MD   3 months ago Acute pain of right knee   [place] [facility] Park Meo, FNP   6 months ago Decreased hearing of right ear   [place] [facility] Dimas Aguas, [facility]  S, FNP   7 months ago Onychomycosis   [place] [facility] Pickard, Priscille Heidelberg, MD   9 months ago Tachycardia   [place] [facility] Pickard, Priscille Heidelberg, MD

## 2024-02-29 ENCOUNTER — Ambulatory Visit: Payer: Self-pay

## 2024-02-29 NOTE — Telephone Encounter (Signed)
 Copied from CRM [id number]. Topic: Clinical - Red Word Triage >> Feb 29, 2024  3:46 PM Emylou G wrote: [facility] that prompted transfer to Nurse Triage: extreme knee pain.  Chief Complaint: knee pain Symptoms: right knee pain that radiates to upper thigh, pain is severe  Frequency: ongoing and worsening x 3 days Pertinent Negatives: Patient denies numbness or tingling Disposition: [] ED /[] Urgent Care (no appt availability in office) / [x] Appointment(In office/virtual)/ []  [facility]/ [] Home Care/ [] Refused Recommended Disposition /[] [facility]/ []  Follow-up with PCP Additional Notes: arthritis in right knee. Normally uses can when not home for support due to knee buckles at times.  Now pt having to use the cane all the time due to pain and feeling as if leg may give out  Reason for Disposition  [1] MODERATE pain (e.g., interferes with normal activities, limping) AND [2] present > 3 days  Answer Assessment - Initial Assessment Questions 1. LOCATION and RADIATION: "Where is the pain located?"      Right knee radiates to thigh 2. QUALITY: "What does the pain feel like?"  (e.g., sharp, dull, aching, burning)     aching 3. SEVERITY: "How bad is the pain?" "What does it keep you from doing?"   (Scale 1-10; or mild, moderate, severe)   -  MILD (1-3): doesn't interfere with normal activities    -  MODERATE (4-7): interferes with normal activities (e.g., work or school) or awakens from sleep, limping    -  SEVERE (8-10): excruciating pain, unable to do any normal activities, unable to walk     9/10 4. ONSET: "When did the pain start?" "Does it come and go, or is it there all the time?"      X3 days 5. RECURRENT: "Have you had this pain before?" If Yes, ask: "When, and what happened then?"     Yes  6. SETTING: "Has there been any recent work, exercise or other activity that involved that part of the body?"      no 7. AGGRAVATING FACTORS: "What makes the knee pain worse?"  (e.g., walking, climbing stairs, running)     Movement increases pain  8. ASSOCIATED SYMPTOMS: "Is there any swelling or redness of the knee?"     Right knee swelling 9. OTHER SYMPTOMS: "Do you have any other symptoms?" (e.g., chest pain, difficulty breathing, fever, calf pain)     no 10. PREGNANCY: "Is there any chance you are pregnant?" "When was your last menstrual period?"       N/a  Protocols used: Knee Pain-A-AH

## 2024-03-03 ENCOUNTER — Encounter: Payer: Self-pay | Admitting: Family Medicine

## 2024-03-03 ENCOUNTER — Ambulatory Visit (INDEPENDENT_AMBULATORY_CARE_PROVIDER_SITE_OTHER): Admitting: Family Medicine

## 2024-03-03 VITALS — BP 130/62 | HR 57 | Temp 97.8°F | Ht 65.0 in | Wt 155.0 lb

## 2024-03-03 DIAGNOSIS — M25561 Pain in right knee: Secondary | ICD-10-CM

## 2024-03-03 MED ORDER — TRIAMCINOLONE ACETONIDE 40 MG/ML IJ SUSP
80.0000 mg | Freq: Once | INTRAMUSCULAR | Status: AC
  Administered 2024-03-03: 80 mg via INTRA_ARTICULAR

## 2024-03-03 NOTE — Progress Notes (Signed)
 Subjective:    Patient ID: Christine Hogan, female    DOB: May 18, 1946, 78 y.o.   MRN: [id number]  Seen in ER  with abdominal pain 02/02/24.  CT revealed: FINDINGS: Lower chest: Minimal bibasilar subsegmental atelectasis or scarring is noted.   Hepatobiliary: No cholelithiasis or biliary dilatation is noted. Stable right hepatic cysts are noted.   Pancreas: Unremarkable. No pancreatic ductal dilatation or surrounding inflammatory changes.   Spleen: Normal in size without focal abnormality.   Adrenals/Urinary Tract: Adrenal glands appear normal. Stable cyst is seen in upper pole of right kidney for which no further follow-up is required. No hydronephrosis or renal obstruction is noted. Urinary bladder is unremarkable.   Stomach/Bowel: Moderate size sliding-type hiatal hernia is noted. The appendix appears normal. 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 There is no evidence of bowel obstruction or inflammation. Diverticulosis of descending and sigmoid colon is noted without inflammation.   Vascular/Lymphatic: Aortic atherosclerosis. No enlarged abdominal or pelvic lymph nodes.   Reproductive: Status post hysterectomy. No adnexal masses.   Other: No ascites or hernia is noted.   Musculoskeletal: No acute or significant osseous findings.   02/07/24 Patient has a history of alcohol abuse.  She states that she has been abstinent from alcohol for 3 days prior to going to the emergency room.  The day she went to the emergency room she did drink a small amount of alcohol.  Shortly thereafter she developed aching epigastric pain.  Saturday morning she woke up with nausea and vomiting and epigastric pain.  She went to the emergency room after having an episode of weakness and shaking which sounds like it may have been a vasovagal event.  She contacted 911.  In the emergency room liver function test were mildly elevated.  Other labs were normal.  CT scan was significant only for a sliding  hiatal hernia.  At that time, my plan was; Differential diagnosis includes gastritis versus chronic pancreatitis.  I suspect most likely gastritis brought on by alcohol use.  Recommended pantoprazole  40 mg twice daily with sucralfate  1 g p.o. 3 times daily.  Reassess in 1 to 2 weeks.  Patient has reached out to GI to make an appointment.  I feel that this is appropriate as she may require EGD.  Recommended abstinence from alcohol  03/03/24  Patient states the pain in her abdomen has stopped.  She is still taking pantoprazole  twice a day and sucralfate  times a day.  She denies any epigastric discomfort.  The reason for her appointment today is pain in her right knee.  She reports pain with ambulation.  The pain hurts in the medial and lateral joint line.  The pain radiates up underneath the kneecap and into the lower femur.  She denies any falls or injuries.  Today on exam, she has significant crepitus with passive range of motion.  There is no laxity to varus or valgus stress.  She has negative anterior and posterior drawer sign.  Past Medical History:  Diagnosis Date   Arthritis    Atrial fibrillation (HCC)    Chest pain    a. 2014: NST with no evidence of ischemia   CKD (chronic kidney disease) stage 3, GFR 30-59 ml/min (HCC)    Diastolic dysfunction    grade 2 with dyspnea on exertion   H/O echocardiogram    a. 2014: echo showing EF of 65-70% with Grade 1 DD, moderate TR, and mild to moderate MR   Hyperlipidemia    Hypertension  Near syncope    Osteopenia    Past Surgical History:  Procedure Laterality Date   ABDOMINAL HYSTERECTOMY     TAH/BSO (menorrhagia)   bunion repair     HERNIA REPAIR     Current Outpatient Medications on File Prior to Visit  Medication Sig Dispense Refill   Cholecalciferol (VITAMIN D3 PO) Take 1 tablet by mouth daily.     diclofenac  Sodium (VOLTAREN ) 1 % GEL Apply 2 g topically 4 (four) times daily. (Patient taking differently: Apply 2 g topically daily as  needed (for pain).) 50 g 0   ELIQUIS  5 MG TABS tablet TAKE 1 TABLET BY MOUTH TWICE  DAILY 200 tablet 2   lidocaine  (XYLOCAINE ) 5 % ointment Apply 1 Application topically as needed. (Patient taking differently: Apply 1 Application topically daily as needed for mild pain (pain score 1-3).) 35.44 g 0   metoprolol  succinate (TOPROL  XL) 25 MG 24 hr tablet Take 1 tablet (25 mg total) by mouth daily. 30 tablet 0   Multiple Vitamins-Minerals (CENTRUM SILVER 50+WOMEN) TABS Take 1 tablet by mouth daily with breakfast.     Omega-3 Fatty Acids  (OMEGA 3 500 PO) Take 1 capsule by mouth daily.     ondansetron  (ZOFRAN ) 4 MG tablet Take 1 tablet (4 mg total) by mouth every 8 (eight) hours as needed for nausea or vomiting. 10 tablet 0   pantoprazole  (PROTONIX ) 40 MG tablet Take 1 tablet (40 mg total) by mouth 2 (two) times daily. 60 tablet 3   rosuvastatin  (CRESTOR ) 10 MG tablet TAKE 1 TABLET BY MOUTH ONCE  DAILY 100 tablet 2   sucralfate  (CARAFATE ) 1 g tablet Take 1 tablet (1 g total) by mouth 4 (four) times daily -  with meals and at bedtime. 120 tablet 0   terbinafine  (LAMISIL ) 250 MG tablet Take 1 tablet (250 mg total) by mouth daily. 30 tablet 2   vitamin B-12 (CYANOCOBALAMIN ) 100 MCG tablet Take 100 mcg by mouth daily.     No current facility-administered medications on file prior to visit.   Allergies  Allergen Reactions   Codeine Nausea And Vomiting   Codeine Nausea And Vomiting and Other (See Comments)    Dizziness, also   Social History   Socioeconomic History   Marital status: Divorced    Spouse name: Not on file   Number of children: 5   Years of education: 12th grade   Highest education level: Not on file  Occupational History   Occupation: Retired  Tobacco Use   Smoking status: Never   Smokeless tobacco: Never  Vaping Use   Vaping status: Never Used  Substance and Sexual Activity   Alcohol use: Yes    Comment: occasionally   Drug use: No   Sexual activity: Yes  Other Topics  Concern   Not on file  Social History Narrative   ** Merged History Encounter **       Lives with her son. Right-handed. Four living children. Occasional caffeine use.   Social Drivers of Corporate investment banker Strain: Low Risk  (03/22/2023)   Overall Financial Resource Strain (CARDIA)    Difficulty of Paying Living Expenses: Not hard at all  Food Insecurity: No Food Insecurity (11/08/2023)   Hunger Vital Sign    Worried About Running Out of Food in the Last Year: Never true    Ran Out of Food in the Last Year: Never true  Transportation Needs: No Transportation Needs (11/08/2023)   PRAPARE - Transportation  Lack of Transportation (Medical): No    Lack of Transportation (Non-Medical): No  Physical Activity: Insufficiently Active (03/22/2023)   Exercise Vital Sign    Days of Exercise per Week: 3 days    Minutes of Exercise per Session: 30 min  Stress: No Stress Concern Present (03/22/2023)   [employer] of Occupational Health - Occupational Stress Questionnaire    Feeling of Stress : Not at all  Social Connections: Moderately Isolated (11/08/2023)   Social Connection and Isolation Panel [NHANES]    Frequency of Communication with Friends and Family: More than three times a week    Frequency of Social Gatherings with Friends and Family: More than three times a week    Attends Religious Services: More than 4 times per year    Active Member of [employer] or Organizations: No    Attends Banker Meetings: Never    Marital Status: Widowed  Intimate Partner Violence: Not At Risk (11/08/2023)   Humiliation, Afraid, Rape, and Kick questionnaire    Fear of Current or Ex-Partner: No    Emotionally Abused: No    Physically Abused: No    Sexually Abused: No     Review of Systems  Gastrointestinal:  Positive for abdominal pain.  All other systems reviewed and are negative.      Objective:   Physical Exam Vitals reviewed.  Constitutional:      General: She is not in  acute distress.    Appearance: Normal appearance. She is normal weight. She is not ill-appearing, toxic-appearing or diaphoretic.  HENT:     Mouth/Throat:     Mouth: No oral lesions.     Pharynx: Uvula midline. No pharyngeal swelling or uvula swelling.     Tonsils: No tonsillar exudate.  Eyes:     Extraocular Movements:     Right eye: Normal extraocular motion.     Left eye: Normal extraocular motion.  Cardiovascular:     Rate and Rhythm: Normal rate and regular rhythm.     Pulses: Normal pulses.     Heart sounds: Normal heart sounds. No murmur heard.    No friction rub. No gallop.  Pulmonary:     Effort: Pulmonary effort is normal. No respiratory distress.     Breath sounds: Normal breath sounds. No stridor. No wheezing, rhonchi or rales.  Abdominal:     General: Bowel sounds are decreased.  Musculoskeletal:     Right knee: Crepitus present. Decreased range of motion. Tenderness present over the medial joint line and lateral joint line. No LCL laxity, MCL laxity, ACL laxity or PCL laxity. Normal alignment and normal meniscus.     Instability Tests: Anterior drawer test negative. Posterior drawer test negative.  Neurological:     Mental Status: She is alert.           Assessment & Plan:  Acute pain of right knee I suspect the knee pain is likely due to osteoarthritis.  Avoid NSAIDs given her recent episode of gastritis.  Using sterile technique, I injected the right knee with 2 cc of lidocaine , 2 cc Marcaine, and 2 cc of 40 mg/mL Kenalog .  Patient tolerated the procedure well without complication.

## 2024-03-03 NOTE — Addendum Note (Signed)
 Addended by: Verneda Golder on: 03/03/2024 03:44 PM   Modules accepted: Orders

## 2024-03-09 ENCOUNTER — Other Ambulatory Visit: Payer: Self-pay | Admitting: Cardiovascular Disease

## 2024-03-09 DIAGNOSIS — I480 Paroxysmal atrial fibrillation: Secondary | ICD-10-CM

## 2024-03-10 NOTE — Telephone Encounter (Signed)
 Prescription refill request for Eliquis  received. Indication:afib Last office visit:12/24 Scr:1.52  3/25 Age: 78 Weight:70.3  kg  Prescription refilled

## 2024-03-27 ENCOUNTER — Ambulatory Visit (INDEPENDENT_AMBULATORY_CARE_PROVIDER_SITE_OTHER): Payer: 59 | Admitting: *Deleted

## 2024-03-27 DIAGNOSIS — Z0000 Encounter for general adult medical examination without abnormal findings: Secondary | ICD-10-CM

## 2024-03-27 NOTE — Progress Notes (Signed)
 Subjective:   Christine Hogan is a 78 y.o. female who presents for Medicare Annual (Subsequent) preventive examination.  Visit Complete: Virtual I connected with  Amy Ball on 03/27/24 by a audio enabled telemedicine application and verified that I am speaking with the correct person using two identifiers.  Patient Location: Home  Provider Location: Home Office  I discussed the limitations of evaluation and management by telemedicine. The patient expressed understanding and agreed to proceed.  Vital Signs: Because this visit was a virtual/telehealth visit, some criteria may be missing or patient reported. Any vitals not documented were not able to be obtained and vitals that have been documented are patient reported.   Cardiac Risk Factors include: advanced age (>45men, >72 women)     Objective:     There were no vitals filed for this visit. There is no height or weight on file to calculate BMI.     03/27/2024    2:44 PM 02/02/2024   10:03 AM 11/08/2023    7:00 PM 03/22/2023    9:55 AM 03/09/2022    9:18 AM 07/01/2021    2:29 PM 03/04/2021    3:50 PM  Advanced Directives  Does Patient Have a Medical Advance Directive? No No No No No No No  Would patient like information on creating a medical advance directive? No - Patient declined No - Patient declined No - Patient declined Yes (MAU/Ambulatory/Procedural Areas - Information given) No - Patient declined Yes (MAU/Ambulatory/Procedural Areas - Information given) No - Patient declined    Current Medications (verified) Outpatient Encounter Medications as of 03/27/2024  Medication Sig   Cholecalciferol (VITAMIN D3 PO) Take 1 tablet by mouth daily.   diclofenac  Sodium (VOLTAREN ) 1 % GEL Apply 2 g topically 4 (four) times daily. (Patient taking differently: Apply 2 g topically daily as needed (for pain).)   ELIQUIS  5 MG TABS tablet TAKE 1 TABLET BY MOUTH TWICE  DAILY   lidocaine  (XYLOCAINE ) 5 % ointment Apply 1 Application topically as  needed. (Patient taking differently: Apply 1 Application topically daily as needed for mild pain (pain score 1-3).)   metoprolol  succinate (TOPROL  XL) 25 MG 24 hr tablet Take 1 tablet (25 mg total) by mouth daily.   Multiple Vitamins-Minerals (CENTRUM SILVER 50+WOMEN) TABS Take 1 tablet by mouth daily with breakfast.   Omega-3 Fatty Acids  (OMEGA 3 500 PO) Take 1 capsule by mouth daily.   ondansetron  (ZOFRAN ) 4 MG tablet Take 1 tablet (4 mg total) by mouth every 8 (eight) hours as needed for nausea or vomiting.   rosuvastatin  (CRESTOR ) 10 MG tablet TAKE 1 TABLET BY MOUTH ONCE  DAILY   sucralfate  (CARAFATE ) 1 g tablet Take 1 tablet (1 g total) by mouth 4 (four) times daily -  with meals and at bedtime.   terbinafine  (LAMISIL ) 250 MG tablet Take 1 tablet (250 mg total) by mouth daily.   vitamin B-12 (CYANOCOBALAMIN ) 100 MCG tablet Take 100 mcg by mouth daily.   pantoprazole  (PROTONIX ) 40 MG tablet Take 1 tablet (40 mg total) by mouth 2 (two) times daily. (Patient not taking: Reported on 03/27/2024)   No facility-administered encounter medications on file as of 03/27/2024.    Allergies (verified) Codeine and Codeine   History: Past Medical History:  Diagnosis Date   Arthritis    Atrial fibrillation (HCC)    Chest pain    a. 2014: NST with no evidence of ischemia   CKD (chronic kidney disease) stage 3, GFR 30-59 ml/min (HCC)  Diastolic dysfunction    grade 2 with dyspnea on exertion   H/O echocardiogram    a. 2014: echo showing EF of 65-70% with Grade 1 DD, moderate TR, and mild to moderate MR   Hyperlipidemia    Hypertension    Near syncope    Osteopenia    Past Surgical History:  Procedure Laterality Date   ABDOMINAL HYSTERECTOMY     TAH/BSO (menorrhagia)   bunion repair     HERNIA REPAIR     Family History  Problem Relation Age of Onset   Kidney failure Mother    Hypertension Father    Heart disease Father        pacemaker- followedby Dr. Alvis Ba   Diabetes Sister     Hypertension Sister    Diabetes Sister    Hypertension Sister    Hypertension Daughter    Hypertension Daughter    Breast cancer Daughter    Social History   Socioeconomic History   Marital status: Divorced    Spouse name: Not on file   Number of children: 5   Years of education: 12th grade   Highest education level: Not on file  Occupational History   Occupation: Retired  Tobacco Use   Smoking status: Never   Smokeless tobacco: Never  Vaping Use   Vaping status: Never Used  Substance and Sexual Activity   Alcohol use: Yes    Comment: occasionally   Drug use: No   Sexual activity: Yes  Other Topics Concern   Not on file  Social History Narrative   ** Merged History Encounter **       Lives with her son. Right-handed. Four living children. Occasional caffeine use.   Social Drivers of Corporate investment banker Strain: Low Risk  (03/27/2024)   Overall Financial Resource Strain (CARDIA)    Difficulty of Paying Living Expenses: Not hard at all  Food Insecurity: No Food Insecurity (03/27/2024)   Hunger Vital Sign    Worried About Running Out of Food in the Last Year: Never true    Ran Out of Food in the Last Year: Never true  Transportation Needs: No Transportation Needs (11/08/2023)   PRAPARE - Administrator, Civil Service (Medical): No    Lack of Transportation (Non-Medical): No  Physical Activity: Inactive (03/27/2024)   Exercise Vital Sign    Days of Exercise per Week: 0 days    Minutes of Exercise per Session: 0 min  Stress: No Stress Concern Present (03/27/2024)   [employer] of Occupational Health - Occupational Stress Questionnaire    Feeling of Stress : Not at all  Social Connections: Moderately Isolated (03/27/2024)   Social Connection and Isolation Panel [NHANES]    Frequency of Communication with Friends and Family: More than three times a week    Frequency of Social Gatherings with Friends and Family: More than three times a week     Attends Religious Services: More than 4 times per year    Active Member of [employer] or Organizations: No    Attends Banker Meetings: Never    Marital Status: Widowed    Tobacco Counseling Counseling given: Not Answered   Clinical Intake:  Pre-visit preparation completed: Yes  Pain : No/denies pain     Diabetes: No  How often do you need to have someone help you when you read instructions, pamphlets, or other written materials from your doctor or pharmacy?: 1 - Never  Interpreter Needed?: No  Information entered  by :Kieth Pelt LPN   Activities of Daily Living    03/27/2024    2:51 PM 11/08/2023    7:00 PM  In your present state of health, do you have any difficulty performing the following activities:  Hearing? 0 0  Vision? 0 0  Difficulty concentrating or making decisions? 0 0  Walking or climbing stairs? 0   Dressing or bathing? 0   Doing errands, shopping?  0  Preparing Food and eating ? N   Using the Toilet? N   In the past six months, have you accidently leaked urine? N   Do you have problems with loss of bowel control? N   Managing your Medications? N   Managing your Finances? N   Housekeeping or managing your Housekeeping? N     Patient Care Team: Austine Lefort, MD as PCP - General (Family Medicine) Croitoru, Karyl Paget, MD as PCP - Cardiology (Cardiology) Austine Lefort, MD (Family Medicine) Buck Carbon, My [place], [place] as Referring Physician (Optometry)  Indicate any recent Medical Services you may have received from other than [facility] providers in the past year (date may be approximate).     Assessment:    This is a routine wellness examination for Kealy.  Hearing/Vision screen Hearing Screening - Comments:: No trouble hearing Vision Screening - Comments:: Not up to date [facility]   Goals Addressed             This Visit's Progress    Patient Stated       Stay healthy     Remain active and independent   On track      Depression  Screen    03/27/2024    2:55 PM 10/25/2023    9:54 AM 07/18/2023    3:10 PM 03/26/2023   11:14 AM 03/22/2023    9:55 AM 03/20/2023    2:09 PM 12/25/2022   10:25 AM  PHQ 2/9 Scores  PHQ - 2 Score 0 1 0 0 0 0 0  PHQ- 9 Score 0 4  0 0 5     Fall Risk    03/27/2024    2:45 PM 07/18/2023    3:10 PM 03/26/2023   11:14 AM 03/22/2023    9:54 AM 12/25/2022   10:25 AM  Fall Risk   Falls in the past year? 0 0 0 0 0  Number falls in past yr: 0  0 0 0  Injury with Fall? 0  0 0 0  Risk for fall due to : Impaired balance/gait  No Fall Risks No Fall Risks No Fall Risks  Follow up Falls evaluation completed;Education provided;Falls prevention discussed  Falls prevention discussed Falls prevention discussed;Education provided;Falls evaluation completed Falls prevention discussed    MEDICARE RISK AT HOME: Medicare Risk at Home Any stairs in or around the home?: No If so, are there any without handrails?: No Home free of loose throw rugs in walkways, pet beds, electrical cords, etc?: Yes Adequate lighting in your home to reduce risk of falls?: Yes Life alert?: No Use of a cane, walker or w/c?: Yes Grab bars in the bathroom?: Yes Shower chair or bench in shower?: Yes Elevated toilet seat or a handicapped toilet?: Yes  TIMED UP AND GO:  Was the test performed?  No    Cognitive Function:        03/27/2024    2:49 PM 03/22/2023    9:55 AM 03/09/2022    9:23 AM  6CIT Screen  What Year? 0 points  0 points 0 points  What month? 0 points 0 points 0 points  What time? 0 points 0 points 0 points  Count back from 20 0 points 0 points 0 points  Months in reverse 0 points 0 points 0 points  Repeat phrase 0 points 0 points 2 points  Total Score 0 points 0 points 2 points    Immunizations Immunization History  Administered Date(s) Administered   Influenza,inj,Quad PF,6+ Mos 12/24/2017, 09/22/2018   PFIZER(Purple Top)SARS-COV-2 Vaccination 12/13/2019, 01/03/2020, 09/02/2020   Pneumococcal  Conjugate-13 05/12/2014   Pneumococcal Polysaccharide-23 12/24/2017   Tdap 01/02/2016    TDAP status: Up to date  Flu Vaccine status: Up to date  Pneumococcal vaccine status: Up to date  Covid-19 vaccine status: Information provided on how to obtain vaccines.   Qualifies for Shingles Vaccine? Yes   Zostavax completed No   Shingrix Completed?: No.    Education has been provided regarding the importance of this vaccine. Patient has been advised to call insurance company to determine out of pocket expense if they have not yet received this vaccine. Advised may also receive vaccine at local pharmacy or Health Dept. Verbalized acceptance and understanding.  Screening Tests Health Maintenance  Topic Date Due   Zoster Vaccines- Shingrix (1 of 2) Never done   COVID-19 Vaccine (4 - 2024-25 season) 07/08/2023   INFLUENZA VACCINE  06/06/2024   Medicare Annual Wellness (AWV)  03/27/2025   DTaP/Tdap/Td (2 - Td or Tdap) 01/01/2026   Pneumonia Vaccine 21+ Years old  Completed   DEXA SCAN  Completed   HPV VACCINES  Aged Out   Meningococcal B Vaccine  Aged Out   Hepatitis C Screening  Discontinued    Health Maintenance  Health Maintenance Due  Topic Date Due   Zoster Vaccines- Shingrix (1 of 2) Never done   COVID-19 Vaccine (4 - 2024-25 season) 07/08/2023    Colorectal cancer screening: No longer required.   Mammogram status: No longer required due to  .  Bone Density status: Completed 2019. Results reflect: Bone density results: OSTEOPENIA. Repeat every 3-5 years.   Education provided did not order patient will call if she wants to have it done  Lung Cancer Screening: (Low Dose CT Chest recommended if Age 41-80 years, 20 pack-year currently smoking OR have quit w/in 15years.) does not qualify.   Lung Cancer Screening Referral:   Additional Screening:  Hepatitis C Screening  never done  Vision Screening: Recommended annual ophthalmology exams for early detection of glaucoma and  other disorders of the eye. Is the patient up to date with their annual eye exam?  No  Who is the provider or what is the name of the office in which the patient attends annual eye exams? [facility]    Education provided  If pt is not established with a provider, would they like to be referred to a provider to establish care? No .   Dental Screening: Recommended annual dental exams for proper oral hygiene   Community Resource Referral / Chronic Care Management: CRR required this visit?  No   CCM required this visit?  No     Plan:     I have personally reviewed and noted the following in the patient's chart:   Medical and social history Use of alcohol, tobacco or illicit drugs  Current medications and supplements including opioid prescriptions. Patient is not currently taking opioid prescriptions. Functional ability and status Nutritional status Physical activity Advanced directives List of other physicians Hospitalizations, surgeries, and ER  visits in previous 12 months Vitals Screenings to include cognitive, depression, and falls Referrals and appointments  In addition, I have reviewed and discussed with patient certain preventive protocols, quality metrics, and best practice recommendations. A written personalized care plan for preventive services as well as general preventive health recommendations were provided to patient.     Kieth Pelt, LPN   1/61/0960   After Visit Summary: (MyChart) Due to this being a telephonic visit, the after visit summary with patients personalized plan was offered to patient via MyChart   Nurse Notes:

## 2024-03-27 NOTE — Patient Instructions (Signed)
 [name] , Thank you for taking time to come for your Medicare Wellness Visit. I appreciate your ongoing commitment to your health goals. Please review the following plan we discussed and let me know if I can assist you in the future.   Screening recommendations/referrals: Colonoscopy: no longer required Mammogram: Education provided Bone Density: Education provided Recommended yearly ophthalmology/optometry visit for glaucoma screening and checkup Recommended yearly dental visit for hygiene and checkup  Vaccinations: Influenza vaccine: up to date Pneumococcal vaccine: up to date Tdap vaccine: up to date       Preventive Care 65 Years and Older, Female Preventive care refers to lifestyle choices and visits with your health care provider that can promote health and wellness. What does preventive care include? A yearly physical exam. This is also called an annual well check. Dental exams once or twice a year. Routine eye exams. Ask your health care provider how often you should have your eyes checked. Personal lifestyle choices, including: Daily care of your teeth and gums. Regular physical activity. Eating a healthy diet. Avoiding tobacco and drug use. Limiting alcohol use. Practicing safe sex. Taking low-dose aspirin every day. Taking vitamin and mineral supplements as recommended by your health care provider. What happens during an annual well check? The services and screenings done by your health care provider during your annual well check will depend on your age, overall health, lifestyle risk factors, and family history of disease. Counseling  Your health care provider may ask you questions about your: Alcohol use. Tobacco use. Drug use. Emotional well-being. Home and relationship well-being. Sexual activity. Eating habits. History of falls. Memory and ability to understand (cognition). Work and work Astronomer. Reproductive health. Screening  You may have the  following tests or measurements: Height, weight, and BMI. Blood pressure. Lipid and cholesterol levels. These may be checked every 5 years, or more frequently if you are over 58 years old. Skin check. Lung cancer screening. You may have this screening every year starting at age 27 if you have a 30-pack-year history of smoking and currently smoke or have quit within the past 15 years. Fecal occult blood test (FOBT) of the stool. You may have this test every year starting at age 23. Flexible sigmoidoscopy or colonoscopy. You may have a sigmoidoscopy every 5 years or a colonoscopy every 10 years starting at age 82. Hepatitis C blood test. Hepatitis B blood test. Sexually transmitted disease (STD) testing. Diabetes screening. This is done by checking your blood sugar (glucose) after you have not eaten for a while (fasting). You may have this done every 1-3 years. Bone density scan. This is done to screen for osteoporosis. You may have this done starting at age 26. Mammogram. This may be done every 1-2 years. Talk to your health care provider about how often you should have regular mammograms. Talk with your health care provider about your test results, treatment options, and if necessary, the need for more tests. Vaccines  Your health care provider may recommend certain vaccines, such as: Influenza vaccine. This is recommended every year. Tetanus, diphtheria, and acellular pertussis (Tdap, Td) vaccine. You may need a Td booster every 10 years. Zoster vaccine. You may need this after age 13. Pneumococcal 13-valent conjugate (PCV13) vaccine. One dose is recommended after age 1. Pneumococcal polysaccharide (PPSV23) vaccine. One dose is recommended after age 33. Talk to your health care provider about which screenings and vaccines you need and how often you need them. This information is not intended to replace  advice given to you by your health care provider. Make sure you discuss any questions you  have with your health care provider. Document Released: 11/19/2015 Document Revised: 07/12/2016 Document Reviewed: 08/24/2015 Elsevier Interactive Patient Education  2017 [employer].  Fall Prevention in the Home Falls can cause injuries. They can happen to people of all ages. There are many things you can do to make your home safe and to help prevent falls. What can I do on the outside of my home? Regularly fix the edges of walkways and driveways and fix any cracks. Remove anything that might make you trip as you walk through a door, such as a raised step or threshold. Trim any bushes or trees on the path to your home. Use bright outdoor lighting. Clear any walking paths of anything that might make someone trip, such as rocks or tools. Regularly check to see if handrails are loose or broken. Make sure that both sides of any steps have handrails. Any raised decks and porches should have guardrails on the edges. Have any leaves, snow, or ice cleared regularly. Use sand or salt on walking paths during winter. Clean up any spills in your garage right away. This includes oil or grease spills. What can I do in the bathroom? Use night lights. Install grab bars by the toilet and in the tub and shower. Do not use towel bars as grab bars. Use non-skid mats or decals in the tub or shower. If you need to sit down in the shower, use a plastic, non-slip stool. Keep the floor dry. Clean up any water that spills on the floor as soon as it happens. Remove soap buildup in the tub or shower regularly. Attach bath mats securely with double-sided non-slip rug tape. Do not have throw rugs and other things on the floor that can make you trip. What can I do in the bedroom? Use night lights. Make sure that you have a light by your bed that is easy to reach. Do not use any sheets or blankets that are too big for your bed. They should not hang down onto the floor. Have a firm chair that has side arms. You can  use this for support while you get dressed. Do not have throw rugs and other things on the floor that can make you trip. What can I do in the kitchen? Clean up any spills right away. Avoid walking on wet floors. Keep items that you use a lot in easy-to-reach places. If you need to reach something above you, use a strong step stool that has a grab bar. Keep electrical cords out of the way. Do not use floor polish or wax that makes floors slippery. If you must use wax, use non-skid floor wax. Do not have throw rugs and other things on the floor that can make you trip. What can I do with my stairs? Do not leave any items on the stairs. Make sure that there are handrails on both sides of the stairs and use them. Fix handrails that are broken or loose. Make sure that handrails are as long as the stairways. Check any carpeting to make sure that it is firmly attached to the stairs. Fix any carpet that is loose or worn. Avoid having throw rugs at the top or bottom of the stairs. If you do have throw rugs, attach them to the floor with carpet tape. Make sure that you have a light switch at the top of the stairs and the bottom  of the stairs. If you do not have them, ask someone to add them for you. What else can I do to help prevent falls? Wear shoes that: Do not have high heels. Have rubber bottoms. Are comfortable and fit you well. Are closed at the toe. Do not wear sandals. If you use a stepladder: Make sure that it is fully opened. Do not climb a closed stepladder. Make sure that both sides of the stepladder are locked into place. Ask someone to hold it for you, if possible. Clearly mark and make sure that you can see: Any grab bars or handrails. First and last steps. Where the edge of each step is. Use tools that help you move around (mobility aids) if they are needed. These include: Canes. Walkers. Scooters. Crutches. Turn on the lights when you go into a dark area. Replace any light  bulbs as soon as they burn out. Set up your furniture so you have a clear path. Avoid moving your furniture around. If any of your floors are uneven, fix them. If there are any pets around you, be aware of where they are. Review your medicines with your doctor. Some medicines can make you feel dizzy. This can increase your chance of falling. Ask your doctor what other things that you can do to help prevent falls. This information is not intended to replace advice given to you by your health care provider. Make sure you discuss any questions you have with your health care provider. Document Released: 08/19/2009 Document Revised: 03/30/2016 Document Reviewed: 11/27/2014 Elsevier Interactive Patient Education  2017 [employer].

## 2024-05-04 ENCOUNTER — Other Ambulatory Visit: Payer: Self-pay | Admitting: Family Medicine

## 2024-06-06 ENCOUNTER — Encounter (HOSPITAL_COMMUNITY): Payer: Self-pay | Admitting: Cardiovascular Disease

## 2024-06-10 ENCOUNTER — Telehealth: Payer: Self-pay

## 2024-06-10 ENCOUNTER — Encounter: Payer: Self-pay | Admitting: Family Medicine

## 2024-06-10 NOTE — Telephone Encounter (Signed)
 Copied from CRM #[id number]. Topic: General - Other >> Jun 10, 2024 10:55 AM Christine Hogan wrote: Reason for CRM: Patient requesting an excuse for [employer] duty Callback # [phone]

## 2024-06-11 NOTE — Telephone Encounter (Signed)
 Letter written by Dr. Duanne. Pt advised letter is ready for pick up. Mjp,lpn

## 2024-08-14 ENCOUNTER — Other Ambulatory Visit: Payer: Self-pay | Admitting: Physician Assistant

## 2024-08-21 ENCOUNTER — Encounter: Payer: Self-pay | Admitting: Family Medicine

## 2024-08-21 ENCOUNTER — Ambulatory Visit: Admitting: Family Medicine

## 2024-08-21 VITALS — BP 130/78 | HR 62 | Temp 97.9°F | Ht 65.0 in | Wt 158.0 lb

## 2024-08-21 DIAGNOSIS — H6121 Impacted cerumen, right ear: Secondary | ICD-10-CM | POA: Diagnosis not present

## 2024-08-21 NOTE — Progress Notes (Signed)
 Subjective:    Patient ID: Christine Hogan, female    DOB: [birth year], 78 y.o.   MRN: [id number]  Patient reports a 2-week history of feeling congested in her right ear.  She states that sounds are muffled in her right ear.  She feels like it is itching.  She denies any pain.  She denies any fever.  She denies any sinus pain or sinus pressure.  When she talks on the phone, voices sound muffled  Past Medical History:  Diagnosis Date   Arthritis    Atrial fibrillation (HCC)    Chest pain    a. 2014: NST with no evidence of ischemia   CKD (chronic kidney disease) stage 3, GFR 30-59 ml/min (HCC)    Diastolic dysfunction    grade 2 with dyspnea on exertion   H/O echocardiogram    a. 2014: echo showing EF of 65-70% with Grade 1 DD, moderate TR, and mild to moderate MR   Hyperlipidemia    Hypertension    Near syncope    Osteopenia    Past Surgical History:  Procedure Laterality Date   ABDOMINAL HYSTERECTOMY     TAH/BSO (menorrhagia)   bunion repair     HERNIA REPAIR     Current Outpatient Medications on File Prior to Visit  Medication Sig Dispense Refill   Cholecalciferol (VITAMIN D3 PO) Take 1 tablet by mouth daily.     diclofenac  Sodium (VOLTAREN ) 1 % GEL Apply 2 g topically 4 (four) times daily. (Patient taking differently: Apply 2 g topically daily as needed (for pain).) 50 g 0   ELIQUIS  5 MG TABS tablet TAKE 1 TABLET BY MOUTH TWICE  DAILY 200 tablet 2   lidocaine  (XYLOCAINE ) 5 % ointment Apply 1 Application topically as needed. (Patient taking differently: Apply 1 Application topically daily as needed for mild pain (pain score 1-3).) 35.44 g 0   metoprolol  succinate (TOPROL  XL) 25 MG 24 hr tablet Take 1 tablet (25 mg total) by mouth daily. 30 tablet 0   Multiple Vitamins-Minerals (CENTRUM SILVER 50+WOMEN) TABS Take 1 tablet by mouth daily with breakfast.     Omega-3 Fatty Acids  (OMEGA 3 500 PO) Take 1 capsule by mouth daily.     ondansetron  (ZOFRAN ) 4 MG tablet Take 1 tablet (4 mg  total) by mouth every 8 (eight) hours as needed for nausea or vomiting. 10 tablet 0   pantoprazole  (PROTONIX ) 40 MG tablet TAKE 1 TABLET BY MOUTH TWICE A DAY 180 tablet 1   rosuvastatin  (CRESTOR ) 10 MG tablet TAKE 1 TABLET BY MOUTH ONCE  DAILY 100 tablet 2   sucralfate  (CARAFATE ) 1 g tablet Take 1 tablet (1 g total) by mouth 4 (four) times daily -  with meals and at bedtime. 120 tablet 0   terbinafine  (LAMISIL ) 250 MG tablet Take 1 tablet (250 mg total) by mouth daily. 30 tablet 2   vitamin B-12 (CYANOCOBALAMIN ) 100 MCG tablet Take 100 mcg by mouth daily.     No current facility-administered medications on file prior to visit.   Allergies  Allergen Reactions   Codeine Nausea And Vomiting   Codeine Nausea And Vomiting and Other (See Comments)    Dizziness, also   Social History   Socioeconomic History   Marital status: Divorced    Spouse name: Not on file   Number of children: 5   Years of education: 12th grade   Highest education level: Not on file  Occupational History   Occupation: Retired  Tobacco Use  Smoking status: Never   Smokeless tobacco: Never  Vaping Use   Vaping status: Never Used  Substance and Sexual Activity   Alcohol use: Yes    Comment: occasionally   Drug use: No   Sexual activity: Yes  Other Topics Concern   Not on file  Social History Narrative   ** Merged History Encounter **       Lives with her son. Right-handed. Four living children. Occasional caffeine use.   Social Drivers of Corporate investment banker Strain: Low Risk  (03/27/2024)   Overall Financial Resource Strain (CARDIA)    Difficulty of Paying Living Expenses: Not hard at all  Food Insecurity: No Food Insecurity (03/27/2024)   Hunger Vital Sign    Worried About Running Out of Food in the Last Year: Never true    Ran Out of Food in the Last Year: Never true  Transportation Needs: No Transportation Needs (11/08/2023)   PRAPARE - Administrator, Civil Service (Medical): No     Lack of Transportation (Non-Medical): No  Physical Activity: Inactive (03/27/2024)   Exercise Vital Sign    Days of Exercise per Week: 0 days    Minutes of Exercise per Session: 0 min  Stress: No Stress Concern Present (03/27/2024)   [employer] of Occupational Health - Occupational Stress Questionnaire    Feeling of Stress : Not at all  Social Connections: Moderately Isolated (03/27/2024)   Social Connection and Isolation Panel    Frequency of Communication with Friends and Family: More than three times a week    Frequency of Social Gatherings with Friends and Family: More than three times a week    Attends Religious Services: More than 4 times per year    Active Member of [employer] or Organizations: No    Attends Banker Meetings: Never    Marital Status: Widowed  Intimate Partner Violence: Not At Risk (11/08/2023)   Humiliation, Afraid, Rape, and Kick questionnaire    Fear of Current or Ex-Partner: No    Emotionally Abused: No    Physically Abused: No    Sexually Abused: No     Review of Systems  All other systems reviewed and are negative.      Objective:   Physical Exam Vitals reviewed.  Constitutional:      General: She is not in acute distress.    Appearance: Normal appearance. She is normal weight. She is not ill-appearing, toxic-appearing or diaphoretic.  HENT:     Right Ear: There is impacted cerumen.     Left Ear: Tympanic membrane and ear canal normal.     Nose: No congestion or rhinorrhea.     Mouth/Throat:     Mouth: Mucous membranes are moist. No oral lesions.     Pharynx: Oropharynx is clear. Uvula midline. No pharyngeal swelling, oropharyngeal exudate, posterior oropharyngeal erythema or uvula swelling.     Tonsils: No tonsillar exudate.  Eyes:     Extraocular Movements: Extraocular movements intact.     Right eye: Normal extraocular motion.     Left eye: Normal extraocular motion.     Pupils: Pupils are equal, round, and reactive to  light.  Cardiovascular:     Rate and Rhythm: Normal rate and regular rhythm.     Pulses: Normal pulses.     Heart sounds: Normal heart sounds. No murmur heard.    No friction rub. No gallop.  Pulmonary:     Effort: Pulmonary effort is normal. No respiratory  distress.     Breath sounds: Normal breath sounds. No stridor. No wheezing, rhonchi or rales.  Neurological:     Mental Status: She is alert.           Assessment & Plan:  Impacted cerumen of right ear Patient has a cerumen impaction in her right auditory canal.  This was removed with irrigation and lavage.

## 2024-10-25 ENCOUNTER — Other Ambulatory Visit: Payer: Self-pay | Admitting: Family Medicine

## 2024-11-09 ENCOUNTER — Emergency Department (HOSPITAL_COMMUNITY)
Admission: EM | Admit: 2024-11-09 | Discharge: 2024-11-10 | Disposition: A | Attending: Emergency Medicine | Admitting: Emergency Medicine

## 2024-11-09 ENCOUNTER — Emergency Department (HOSPITAL_COMMUNITY)

## 2024-11-09 ENCOUNTER — Encounter (HOSPITAL_COMMUNITY): Payer: Self-pay

## 2024-11-09 ENCOUNTER — Other Ambulatory Visit: Payer: Self-pay

## 2024-11-09 DIAGNOSIS — R112 Nausea with vomiting, unspecified: Secondary | ICD-10-CM | POA: Diagnosis not present

## 2024-11-09 DIAGNOSIS — D631 Anemia in chronic kidney disease: Secondary | ICD-10-CM | POA: Insufficient documentation

## 2024-11-09 DIAGNOSIS — I509 Heart failure, unspecified: Secondary | ICD-10-CM | POA: Diagnosis not present

## 2024-11-09 DIAGNOSIS — Z7901 Long term (current) use of anticoagulants: Secondary | ICD-10-CM | POA: Diagnosis not present

## 2024-11-09 DIAGNOSIS — N183 Chronic kidney disease, stage 3 unspecified: Secondary | ICD-10-CM | POA: Diagnosis not present

## 2024-11-09 DIAGNOSIS — E876 Hypokalemia: Secondary | ICD-10-CM | POA: Insufficient documentation

## 2024-11-09 DIAGNOSIS — I4891 Unspecified atrial fibrillation: Secondary | ICD-10-CM | POA: Insufficient documentation

## 2024-11-09 DIAGNOSIS — R531 Weakness: Secondary | ICD-10-CM | POA: Diagnosis present

## 2024-11-09 LAB — URINALYSIS, ROUTINE W REFLEX MICROSCOPIC
Bilirubin Urine: NEGATIVE
Glucose, UA: NEGATIVE mg/dL
Ketones, ur: 5 mg/dL — AB
Leukocytes,Ua: NEGATIVE
Nitrite: NEGATIVE
Protein, ur: 100 mg/dL — AB
Specific Gravity, Urine: 1.011 (ref 1.005–1.030)
pH: 5 (ref 5.0–8.0)

## 2024-11-09 LAB — HEPATIC FUNCTION PANEL
ALT: 18 U/L (ref 0–44)
AST: 33 U/L (ref 15–41)
Albumin: 4.1 g/dL (ref 3.5–5.0)
Alkaline Phosphatase: 63 U/L (ref 38–126)
Bilirubin, Direct: 0.3 mg/dL — ABNORMAL HIGH (ref 0.0–0.2)
Indirect Bilirubin: 0.3 mg/dL (ref 0.3–0.9)
Total Bilirubin: 0.6 mg/dL (ref 0.0–1.2)
Total Protein: 6.7 g/dL (ref 6.5–8.1)

## 2024-11-09 LAB — CBC
HCT: 34.3 % — ABNORMAL LOW (ref 36.0–46.0)
Hemoglobin: 11.2 g/dL — ABNORMAL LOW (ref 12.0–15.0)
MCH: 29.5 pg (ref 26.0–34.0)
MCHC: 32.7 g/dL (ref 30.0–36.0)
MCV: 90.3 fL (ref 80.0–100.0)
Platelets: 169 K/uL (ref 150–400)
RBC: 3.8 MIL/uL — ABNORMAL LOW (ref 3.87–5.11)
RDW: 15.6 % — ABNORMAL HIGH (ref 11.5–15.5)
WBC: 5 K/uL (ref 4.0–10.5)
nRBC: 0 % (ref 0.0–0.2)

## 2024-11-09 LAB — BASIC METABOLIC PANEL WITH GFR
Anion gap: 18 — ABNORMAL HIGH (ref 5–15)
BUN: 16 mg/dL (ref 8–23)
CO2: 19 mmol/L — ABNORMAL LOW (ref 22–32)
Calcium: 9.2 mg/dL (ref 8.9–10.3)
Chloride: 107 mmol/L (ref 98–111)
Creatinine, Ser: 1.15 mg/dL — ABNORMAL HIGH (ref 0.44–1.00)
GFR, Estimated: 49 mL/min — ABNORMAL LOW
Glucose, Bld: 121 mg/dL — ABNORMAL HIGH (ref 70–99)
Potassium: 2.8 mmol/L — ABNORMAL LOW (ref 3.5–5.1)
Sodium: 144 mmol/L (ref 135–145)

## 2024-11-09 LAB — PRO BRAIN NATRIURETIC PEPTIDE: Pro Brain Natriuretic Peptide: 1824 pg/mL — ABNORMAL HIGH

## 2024-11-09 LAB — TROPONIN T, HIGH SENSITIVITY: Troponin T High Sensitivity: 33 ng/L — ABNORMAL HIGH (ref 0–19)

## 2024-11-09 MED ORDER — ONDANSETRON HCL 4 MG/2ML IJ SOLN
4.0000 mg | Freq: Once | INTRAMUSCULAR | Status: AC
  Administered 2024-11-09: 4 mg via INTRAVENOUS
  Filled 2024-11-09: qty 2

## 2024-11-09 NOTE — ED Triage Notes (Signed)
 Pt bib ems with Malaise, weakness, hx of afib, uti (increased symptoms), body aches. Family noticed change in pt with increased tremor and sob  Bp: 154/76 Hr: 96 O2: 99 Cbg 110 Etco:17 Rr: 32  20 g rt forearm 4 mg: zofran  Ns

## 2024-11-10 ENCOUNTER — Emergency Department (HOSPITAL_COMMUNITY)

## 2024-11-10 MED ORDER — ONDANSETRON 4 MG PO TBDP: 4.0000 mg | ORAL_TABLET | Freq: Three times a day (TID) | ORAL | 0 refills | Status: AC | PRN

## 2024-11-10 MED ORDER — POTASSIUM CHLORIDE 20 MEQ PO PACK
20.0000 meq | PACK | Freq: Once | ORAL | Status: AC
  Administered 2024-11-10: 20 meq via ORAL
  Filled 2024-11-10: qty 1

## 2024-11-10 MED ORDER — POTASSIUM CHLORIDE 10 MEQ/100ML IV SOLN
10.0000 meq | INTRAVENOUS | Status: AC
  Administered 2024-11-10 (×3): 10 meq via INTRAVENOUS
  Filled 2024-11-10 (×3): qty 100

## 2024-11-10 MED ORDER — POTASSIUM CHLORIDE CRYS ER 20 MEQ PO TBCR: 20.0000 meq | EXTENDED_RELEASE_TABLET | Freq: Every day | ORAL | 0 refills | Status: AC

## 2024-11-10 MED ORDER — IOHEXOL 300 MG/ML  SOLN
75.0000 mL | Freq: Once | INTRAMUSCULAR | Status: AC | PRN
  Administered 2024-11-10: 75 mL via INTRAVENOUS

## 2024-11-10 MED ORDER — SODIUM CHLORIDE 0.9 % IV BOLUS
1000.0000 mL | Freq: Once | INTRAVENOUS | Status: AC
  Administered 2024-11-10: 1000 mL via INTRAVENOUS

## 2024-11-10 MED ORDER — METOCLOPRAMIDE HCL 5 MG/ML IJ SOLN
10.0000 mg | Freq: Once | INTRAMUSCULAR | Status: AC
  Administered 2024-11-10: 10 mg via INTRAVENOUS
  Filled 2024-11-10: qty 2

## 2024-11-10 MED ORDER — POTASSIUM CHLORIDE CRYS ER 20 MEQ PO TBCR
40.0000 meq | EXTENDED_RELEASE_TABLET | Freq: Once | ORAL | Status: AC
  Administered 2024-11-10: 20 meq via ORAL
  Filled 2024-11-10: qty 2

## 2024-11-10 NOTE — Discharge Instructions (Addendum)
 You are seen in the ER today for your abdominal pain and your nausea.  Your workup was reassuring.  You may have a viral illness causing your nausea.  Additionally, you were found to have low potassium. Please take it as prescribed for the next 3 days and follow up with your primary care doctor for reevaluation of this level. You may take the prescribed nausea medication as needed and should return to the ER with any new severe symptoms.

## 2024-11-10 NOTE — ED Provider Notes (Addendum)
 " [place] EMERGENCY DEPARTMENT AT [facility] Provider Note   CSN: [id number] Arrival date & time: 11/09/24  2215     Patient presents with: Shortness of Breath and Weakness   Christine Hogan is a 79 y.o. female with history of paroxysmal A-fib anticoagulated on Eliquis , CKD stage III, CHF With EF of 60-65%, grade 3 diastolic dysfunction (restrictive) who presents to the emergency department with concern for about 4 hours of severe nausea and some generalized crampy abdominal pain.  No known contacts, no fevers, no vomiting; patient is having chills.  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  Denies any history of alcohol withdrawal.  Mildly tremulous with the patient states is at her baseline but no anxiety, tactile  hallucinations, tachycardia.     HPI     Prior to Admission medications  Medication Sig Start Date End Date Taking? Authorizing Provider  ondansetron  (ZOFRAN -ODT) 4 MG disintegrating tablet Take 1 tablet (4 mg total) by mouth every 8 (eight) hours as needed for nausea or vomiting. 11/10/24  Yes Correne Lalani R, PA-C  potassium chloride  SA (KLOR-CON  M) 20 MEQ tablet Take 1 tablet (20 mEq total) by mouth daily. 11/10/24 11/13/24 Yes Cerise Lieber R, PA-C  Cholecalciferol (VITAMIN D3 PO) Take 1 tablet by mouth daily.    [provider]  diclofenac  Sodium (VOLTAREN ) 1 % GEL Apply 2 g topically 4 (four) times daily. Patient taking differently: Apply 2 g topically daily as needed (for pain). 05/26/22   Teresa Shelba SAUNDERS, NP  ELIQUIS  5 MG TABS tablet TAKE 1 TABLET BY MOUTH TWICE  DAILY 03/10/24   O'Neal, Darryle Ned, MD  lidocaine  (XYLOCAINE ) 5 % ointment Apply 1 Application topically as needed. Patient taking differently: Apply 1 Application topically daily as needed for mild pain (pain score 1-3). 05/26/22   Teresa Shelba SAUNDERS, NP  metoprolol  succinate (TOPROL  XL) 25 MG 24 hr tablet Take 1 tablet (25 mg  total) by mouth daily. 11/09/23 11/08/24  Tobie Yetta HERO, MD  Multiple Vitamins-Minerals (CENTRUM SILVER 50+WOMEN) TABS Take 1 tablet by mouth daily with breakfast.    [provider]  Omega-3 Fatty Acids  (OMEGA 3 500 PO) Take 1 capsule by mouth daily.    [provider]  pantoprazole  (PROTONIX ) 40 MG tablet TAKE 1 TABLET BY MOUTH TWICE A DAY 05/05/24   Duanne Butler DASEN, MD  rosuvastatin  (CRESTOR ) 10 MG tablet TAKE 1 TABLET BY MOUTH ONCE  DAILY 10/27/24   Duanne Butler DASEN, MD  sucralfate  (CARAFATE ) 1 g tablet Take 1 tablet (1 g total) by mouth 4 (four) times daily -  with meals and at bedtime. 02/07/24   Duanne Butler DASEN, MD  terbinafine  (LAMISIL ) 250 MG tablet Take 1 tablet (250 mg total) by mouth daily. 06/18/23   Duanne Butler DASEN, MD  vitamin B-12 (CYANOCOBALAMIN ) 100 MCG tablet Take 100 mcg by mouth daily.    [provider]    Allergies: Codeine and Codeine    Review of Systems  Constitutional:  Positive for appetite change, chills and fatigue. Negative for diaphoresis and fever.  HENT: Negative.    Gastrointestinal:  Positive for abdominal pain and nausea. Negative for diarrhea and vomiting.  Genitourinary:  Positive for frequency. Negative for difficulty urinating, dysuria, flank pain, urgency and vaginal pain.  Skin: Negative.     Updated Vital Signs BP 134/67   Pulse 85   Temp 98.3 F (36.8 C) (Oral)  Resp (!) 25   Ht 5' 5.5 (1.664 m)   Wt 68 kg   SpO2 98%   BMI 24.58 kg/m   Physical Exam Vitals and nursing note reviewed.  Constitutional:      Appearance: She is obese. She is not ill-appearing or toxic-appearing.  HENT:     Head: Normocephalic and atraumatic.     Mouth/Throat:     Mouth: Mucous membranes are moist.     Pharynx: No oropharyngeal exudate or posterior oropharyngeal erythema.  Eyes:     General:        Right eye: No discharge.        Left eye: No discharge.     Conjunctiva/sclera: Conjunctivae normal.  Cardiovascular:      Rate and Rhythm: Normal rate and regular rhythm.     Pulses: Normal pulses.  Pulmonary:     Effort: Pulmonary effort is normal. No respiratory distress.     Breath sounds: Normal breath sounds. No wheezing or rales.  Chest:     Chest wall: No mass, tenderness or edema.  Abdominal:     General: Bowel sounds are normal. There is no distension.     Tenderness: There is no abdominal tenderness.  Musculoskeletal:        General: No deformity.     Cervical back: Neck supple.     Right lower leg: No edema.     Left lower leg: No edema.  Skin:    General: Skin is warm and dry.     Capillary Refill: Capillary refill takes less than 2 seconds.  Neurological:     General: No focal deficit present.     Mental Status: She is alert and oriented to person, place, and time. Mental status is at baseline.  Psychiatric:        Mood and Affect: Mood normal.    (all labs ordered are listed, but only abnormal results are displayed) Labs Reviewed  URINALYSIS, ROUTINE W REFLEX MICROSCOPIC - Abnormal; Notable for the following components:      Result Value   Color, Urine STRAW (*)    Hgb urine dipstick MODERATE (*)    Ketones, ur 5 (*)    Protein, ur 100 (*)    Bacteria, UA RARE (*)    All other components within normal limits  BASIC METABOLIC PANEL WITH GFR - Abnormal; Notable for the following components:   Potassium 2.8 (*)    CO2 19 (*)    Glucose, Bld 121 (*)    Creatinine, Ser 1.15 (*)    GFR, Estimated 49 (*)    Anion gap 18 (*)    All other components within normal limits  CBC - Abnormal; Notable for the following components:   RBC 3.80 (*)    Hemoglobin 11.2 (*)    HCT 34.3 (*)    RDW 15.6 (*)    All other components within normal limits  HEPATIC FUNCTION PANEL - Abnormal; Notable for the following components:   Bilirubin, Direct 0.3 (*)    All other components within normal limits  PRO BRAIN NATRIURETIC PEPTIDE - Abnormal; Notable for the following components:   Pro Brain  Natriuretic Peptide 1,824.0 (*)    All other components within normal limits  TROPONIN T, HIGH SENSITIVITY - Abnormal; Notable for the following components:   Troponin T High Sensitivity 33 (*)    All other components within normal limits    EKG: EKG Interpretation Date/Time:  Sunday November 09 2024 22:27:53 EST Ventricular Rate:  100 PR Interval:    QRS Duration:  96 QT Interval:  382 QTC Calculation: 493 R Axis:   35  Text Interpretation: sinus rhythm, pac Anteroseptal infarct, old Confirmed by Griselda Norris [phone]) on 11/09/2024 11:04:30 PM  Radiology: CT ABDOMEN PELVIS W CONTRAST Result Date: 11/10/2024 EXAM: CT ABDOMEN AND PELVIS WITH CONTRAST 11/10/2024 03:02:36 AM TECHNIQUE: CT of the abdomen and pelvis was performed with the administration of intravenous contrast. 75 mL of iohexol  (OMNIPAQUE ) 300 MG/ML solution was administered. Multiplanar reformatted images are provided for review. Automated exposure control, iterative reconstruction, and/or weight-based adjustment of the mA/kV was utilized to reduce the radiation dose to as low as reasonably achievable. COMPARISON: 02/02/2024 CLINICAL HISTORY: RLQ abdominal pain. FINDINGS: LOWER CHEST: Small hiatal hernia. Bibasilar scarring. Cardiomegaly. LIVER: Scattered cysts in the liver are stable. GALLBLADDER AND BILE DUCTS: Gallbladder is unremarkable. No biliary ductal dilatation. SPLEEN: No acute abnormality. PANCREAS: No acute abnormality. ADRENAL GLANDS: No acute abnormality. KIDNEYS, URETERS AND BLADDER: Bilateral renal cysts scattered, the largest 12 mm in the upper pole of the right kidney. These appear benign. Per consensus, no follow-up is needed for simple Bosniak type 1 and 2 renal cysts, unless the patient has a malignancy history or risk factors. No stones in the kidneys or ureters. No hydronephrosis. No perinephric or periureteral stranding. Urinary bladder is unremarkable. GI AND BOWEL: Stomach demonstrates no acute abnormality.  Colonic diverticulosis. Normal appendix. There is no bowel obstruction. PERITONEUM AND RETROPERITONEUM: No ascites. No free air. VASCULATURE: Aorta is normal in caliber. Diffuse aortoiliac atherosclerosis. LYMPH NODES: No lymphadenopathy. REPRODUCTIVE ORGANS: Prior hysterectomy. No adnexal mass. BONES AND SOFT TISSUES: No acute osseous abnormality. No focal soft tissue abnormality. IMPRESSION: 1. No acute findings in the abdomen or pelvis. 2. Colonic diverticulosis. 3. Small hiatal hernia. 4. Aortic atherosclerosis. Electronically signed by: Franky Crease MD 11/10/2024 03:08 AM EST RP Workstation: [id number]   DG Chest 2 View Result Date: 11/09/2024 EXAM: 2 VIEW(S) XRAY OF THE CHEST 11/09/2024 11:23:00 PM COMPARISON: Comparison with 11/08/2023. CLINICAL HISTORY: SOB. Malaise, weakness, atrial fibrillation, body aches. FINDINGS: LUNGS AND PLEURA: Right hilar enlargement is unchanged since the prior study and appears to correspond to prominent vascular structures on prior CT from 01/13/2022. No airspace disease or consolidation in the lungs. No pleural effusion. No pneumothorax. HEART AND MEDIASTINUM: Cardiac enlargement. Calcification of the aorta. BONES AND SOFT TISSUES: No acute osseous abnormality. IMPRESSION: 1. No acute cardiopulmonary findings. 2. Cardiac enlargement and stable right hilar enlargement, corresponding to prominent vascular structures on prior CT from 01/13/2022. Electronically signed by: Elsie Gravely MD 11/09/2024 11:27 PM EST RP Workstation: [id number]     Procedures   Medications Ordered in the ED  potassium chloride  SA (KLOR-CON  M) CR tablet 40 mEq (has no administration in time range)  ondansetron  (ZOFRAN ) injection 4 mg (4 mg Intravenous Given 11/09/24 2333)  potassium chloride  10 mEq in 100 mL IVPB (0 mEq Intravenous Stopped 11/10/24 0340)  sodium chloride  0.9 % bolus 1,000 mL (1,000 mLs Intravenous New Bag/Given 11/10/24 0025)  metoCLOPramide  (REGLAN ) injection 10 mg (10 mg  Intravenous Given 11/10/24 0245)  iohexol  (OMNIPAQUE ) 300 MG/ML solution 75 mL (75 mLs Intravenous Contrast Given 11/10/24 0251)                                    Medical Decision Making 79 y/o female who presents with concern for N, abdominal pain.   Mildly hypertensive and very  mildly tachycardic on intake, vital signs otherwise normal.  Cardiopulmonary and abdominal exams are benign at time of initial evaluation.  Patient did subsequently develop right lower quadrant discomfort and tenderness palpation.  DDx includes limited to viral gastroenteritis, gastritis, diverticulitis, UTI, appendicitis, other intra-abdominal infection, influenza or other flulike illness.  See [facility]    Amount and/or Complexity of Data Reviewed Labs: ordered.    Details: CBC is without leukocytosis, mild anemia with hemoglobin 11 near patient's baseline.  BMP with hypokalemia of 2.8, creatinine of 1.1 at patient's baseline.  Mildly elevated anion gap to 18.  Hepatic function panel is normal.  UA with moderate hemoglobin but negative for nitrates white blood cells only rare bacteria.  Given patient is not having any dysuria, urgency, or flank pain, doubt UTI as etiology for symptoms.  BNP 1824, troponin 33.   Radiology: ordered.    Details:   Chest x-ray with cardiomegaly stable right hilar enlargement corresponding with prominent vascular structures on CT from the past, no acute cardiopulmonary findings.  CT of the abdomen pelvis without acute finding.  Risk Prescription drug management.   Clinical picture most consistent with acute flulike illness, likely viral in nature.  Patient not consistent with acute alcohol withdrawal.  Patient without evidence of acute organ dysfunction, reassuring hemodynamics, ambulatory in the ED and tolerating p.o.  Clinical concern for emergent underlying condition that would warrant further ED workup or inpatient management is exceedingly low.  Will discharge with antiemetics,  instructions for oral rehydration therapy, oral potassium, and close outpatient follow-up recommendations for recheck of potassium as well as patient's symptoms.  Saundra voiced understanding of her medical evaluation and treatment plan. Each of their questions answered to their expressed satisfaction.  Return precautions were given.  Patient is well-appearing, stable, and was discharged in good condition.  This chart was dictated using voice recognition software, Dragon. Despite the best efforts of this provider to proofread and correct errors, errors may still occur which can change documentation meaning.      Final diagnoses:  Nausea and vomiting, unspecified vomiting type    ED Discharge Orders          Ordered    ondansetron  (ZOFRAN -ODT) 4 MG disintegrating tablet  Every 8 hours PRN        11/10/24 0517    potassium chloride  SA (KLOR-CON  M) 20 MEQ tablet  Daily        11/10/24 0519                Neve Branscomb, Pleasant SAUNDERS, PA-C 11/10/24 0520    Griselda Norris, MD 11/10/24 [phone]  "

## 2024-11-10 NOTE — ED Notes (Signed)
 Patient was able to consume only one 20 mEq potassium pill with difficulty. Unable to swallow the other 20mEq potassium pill.

## 2024-11-29 ENCOUNTER — Other Ambulatory Visit: Payer: Self-pay | Admitting: Cardiovascular Disease

## 2024-11-29 DIAGNOSIS — I480 Paroxysmal atrial fibrillation: Secondary | ICD-10-CM

## 2025-04-02 ENCOUNTER — Encounter
